# Patient Record
Sex: Male | Born: 1950 | Race: White | Hispanic: No | Marital: Single | Smoking: Former smoker
Health system: Southern US, Community
[De-identification: ages and names within clinical notes are randomized; demographics above are authoritative.]

## PROBLEM LIST (undated history)

## (undated) DIAGNOSIS — B20 Human immunodeficiency virus [HIV] disease: Secondary | ICD-10-CM

## (undated) DIAGNOSIS — B1920 Unspecified viral hepatitis C without hepatic coma: Secondary | ICD-10-CM

## (undated) HISTORY — PX: NO PAST SURGERIES: SHX2092

---

## 1993-03-04 ENCOUNTER — Encounter (INDEPENDENT_AMBULATORY_CARE_PROVIDER_SITE_OTHER): Payer: Self-pay | Admitting: *Deleted

## 1997-05-13 ENCOUNTER — Encounter: Admission: RE | Admit: 1997-05-13 | Discharge: 1997-05-13 | Payer: Self-pay | Admitting: Infectious Diseases

## 1997-06-10 ENCOUNTER — Encounter: Admission: RE | Admit: 1997-06-10 | Discharge: 1997-06-10 | Payer: Self-pay | Admitting: Infectious Diseases

## 1997-08-12 ENCOUNTER — Encounter: Admission: RE | Admit: 1997-08-12 | Discharge: 1997-08-12 | Payer: Self-pay | Admitting: Infectious Diseases

## 1997-09-18 ENCOUNTER — Encounter: Admission: RE | Admit: 1997-09-18 | Discharge: 1997-09-18 | Payer: Self-pay | Admitting: Infectious Diseases

## 1997-11-18 ENCOUNTER — Ambulatory Visit (HOSPITAL_COMMUNITY): Admission: RE | Admit: 1997-11-18 | Discharge: 1997-11-18 | Payer: Self-pay | Admitting: Infectious Diseases

## 1997-11-18 ENCOUNTER — Encounter: Admission: RE | Admit: 1997-11-18 | Discharge: 1997-11-18 | Payer: Self-pay | Admitting: Infectious Diseases

## 1997-12-18 ENCOUNTER — Encounter: Admission: RE | Admit: 1997-12-18 | Discharge: 1997-12-18 | Payer: Self-pay | Admitting: Infectious Diseases

## 1997-12-25 ENCOUNTER — Encounter: Admission: RE | Admit: 1997-12-25 | Discharge: 1997-12-25 | Payer: Self-pay | Admitting: Infectious Diseases

## 1998-03-11 ENCOUNTER — Ambulatory Visit (HOSPITAL_COMMUNITY): Admission: RE | Admit: 1998-03-11 | Discharge: 1998-03-11 | Payer: Self-pay | Admitting: Infectious Diseases

## 1998-03-26 ENCOUNTER — Encounter: Admission: RE | Admit: 1998-03-26 | Discharge: 1998-03-26 | Payer: Self-pay | Admitting: Infectious Diseases

## 1998-04-08 ENCOUNTER — Emergency Department (HOSPITAL_COMMUNITY): Admission: EM | Admit: 1998-04-08 | Discharge: 1998-04-08 | Payer: Self-pay | Admitting: Emergency Medicine

## 1998-04-09 ENCOUNTER — Encounter: Payer: Self-pay | Admitting: Infectious Diseases

## 1998-04-10 ENCOUNTER — Ambulatory Visit (HOSPITAL_COMMUNITY): Admission: RE | Admit: 1998-04-10 | Discharge: 1998-04-10 | Payer: Self-pay | Admitting: Internal Medicine

## 1998-04-10 ENCOUNTER — Encounter: Admission: RE | Admit: 1998-04-10 | Discharge: 1998-04-10 | Payer: Self-pay | Admitting: Internal Medicine

## 1998-04-10 ENCOUNTER — Encounter: Payer: Self-pay | Admitting: Internal Medicine

## 1998-05-26 ENCOUNTER — Encounter: Admission: RE | Admit: 1998-05-26 | Discharge: 1998-05-26 | Payer: Self-pay | Admitting: Infectious Diseases

## 1998-07-29 ENCOUNTER — Ambulatory Visit (HOSPITAL_COMMUNITY): Admission: RE | Admit: 1998-07-29 | Discharge: 1998-07-29 | Payer: Self-pay | Admitting: Infectious Diseases

## 1998-08-13 ENCOUNTER — Encounter: Admission: RE | Admit: 1998-08-13 | Discharge: 1998-08-13 | Payer: Self-pay | Admitting: Infectious Diseases

## 1998-11-13 ENCOUNTER — Ambulatory Visit (HOSPITAL_COMMUNITY): Admission: RE | Admit: 1998-11-13 | Discharge: 1998-11-13 | Payer: Self-pay | Admitting: Infectious Diseases

## 1998-11-13 ENCOUNTER — Encounter: Admission: RE | Admit: 1998-11-13 | Discharge: 1998-11-13 | Payer: Self-pay | Admitting: Hematology and Oncology

## 1998-12-15 ENCOUNTER — Encounter: Admission: RE | Admit: 1998-12-15 | Discharge: 1998-12-15 | Payer: Self-pay | Admitting: Infectious Diseases

## 1999-03-05 ENCOUNTER — Encounter: Admission: RE | Admit: 1999-03-05 | Discharge: 1999-03-05 | Payer: Self-pay | Admitting: Internal Medicine

## 1999-03-05 ENCOUNTER — Ambulatory Visit (HOSPITAL_COMMUNITY): Admission: RE | Admit: 1999-03-05 | Discharge: 1999-03-05 | Payer: Self-pay | Admitting: Infectious Diseases

## 1999-03-18 ENCOUNTER — Encounter: Admission: RE | Admit: 1999-03-18 | Discharge: 1999-03-18 | Payer: Self-pay | Admitting: Infectious Diseases

## 1999-08-10 ENCOUNTER — Encounter: Admission: RE | Admit: 1999-08-10 | Discharge: 1999-08-10 | Payer: Self-pay | Admitting: Infectious Diseases

## 1999-08-10 ENCOUNTER — Ambulatory Visit (HOSPITAL_COMMUNITY): Admission: RE | Admit: 1999-08-10 | Discharge: 1999-08-10 | Payer: Self-pay | Admitting: Infectious Diseases

## 1999-08-24 ENCOUNTER — Encounter: Admission: RE | Admit: 1999-08-24 | Discharge: 1999-08-24 | Payer: Self-pay | Admitting: Infectious Diseases

## 1999-12-14 ENCOUNTER — Ambulatory Visit (HOSPITAL_COMMUNITY): Admission: RE | Admit: 1999-12-14 | Discharge: 1999-12-14 | Payer: Self-pay | Admitting: Infectious Diseases

## 1999-12-14 ENCOUNTER — Encounter: Admission: RE | Admit: 1999-12-14 | Discharge: 1999-12-14 | Payer: Self-pay | Admitting: Infectious Diseases

## 1999-12-28 ENCOUNTER — Encounter: Admission: RE | Admit: 1999-12-28 | Discharge: 1999-12-28 | Payer: Self-pay | Admitting: Infectious Diseases

## 2000-02-29 ENCOUNTER — Encounter: Admission: RE | Admit: 2000-02-29 | Discharge: 2000-02-29 | Payer: Self-pay | Admitting: Infectious Diseases

## 2000-02-29 ENCOUNTER — Ambulatory Visit (HOSPITAL_COMMUNITY): Admission: RE | Admit: 2000-02-29 | Discharge: 2000-02-29 | Payer: Self-pay | Admitting: Infectious Diseases

## 2000-03-21 ENCOUNTER — Encounter: Admission: RE | Admit: 2000-03-21 | Discharge: 2000-03-21 | Payer: Self-pay | Admitting: Infectious Diseases

## 2000-06-29 ENCOUNTER — Encounter: Admission: RE | Admit: 2000-06-29 | Discharge: 2000-06-29 | Payer: Self-pay | Admitting: Infectious Diseases

## 2000-06-29 ENCOUNTER — Ambulatory Visit (HOSPITAL_COMMUNITY): Admission: RE | Admit: 2000-06-29 | Discharge: 2000-06-29 | Payer: Self-pay | Admitting: Infectious Diseases

## 2000-07-11 ENCOUNTER — Encounter: Admission: RE | Admit: 2000-07-11 | Discharge: 2000-07-11 | Payer: Self-pay | Admitting: Infectious Diseases

## 2000-10-25 ENCOUNTER — Ambulatory Visit (HOSPITAL_COMMUNITY): Admission: RE | Admit: 2000-10-25 | Discharge: 2000-10-25 | Payer: Self-pay | Admitting: Infectious Diseases

## 2000-10-25 ENCOUNTER — Encounter: Admission: RE | Admit: 2000-10-25 | Discharge: 2000-10-25 | Payer: Self-pay | Admitting: Infectious Diseases

## 2000-11-16 ENCOUNTER — Encounter: Admission: RE | Admit: 2000-11-16 | Discharge: 2000-11-16 | Payer: Self-pay | Admitting: Infectious Diseases

## 2000-12-12 ENCOUNTER — Encounter: Admission: RE | Admit: 2000-12-12 | Discharge: 2000-12-12 | Payer: Self-pay | Admitting: Infectious Diseases

## 2001-02-20 ENCOUNTER — Encounter: Admission: RE | Admit: 2001-02-20 | Discharge: 2001-02-20 | Payer: Self-pay | Admitting: Infectious Diseases

## 2001-02-20 ENCOUNTER — Ambulatory Visit (HOSPITAL_COMMUNITY): Admission: RE | Admit: 2001-02-20 | Discharge: 2001-02-20 | Payer: Self-pay | Admitting: Infectious Diseases

## 2001-04-10 ENCOUNTER — Encounter: Admission: RE | Admit: 2001-04-10 | Discharge: 2001-04-10 | Payer: Self-pay | Admitting: Infectious Diseases

## 2001-06-12 ENCOUNTER — Encounter: Admission: RE | Admit: 2001-06-12 | Discharge: 2001-06-12 | Payer: Self-pay | Admitting: Internal Medicine

## 2001-06-12 ENCOUNTER — Ambulatory Visit (HOSPITAL_COMMUNITY): Admission: RE | Admit: 2001-06-12 | Discharge: 2001-06-12 | Payer: Self-pay | Admitting: Internal Medicine

## 2001-07-03 ENCOUNTER — Encounter: Admission: RE | Admit: 2001-07-03 | Discharge: 2001-07-03 | Payer: Self-pay | Admitting: Infectious Diseases

## 2001-10-03 ENCOUNTER — Encounter: Admission: RE | Admit: 2001-10-03 | Discharge: 2001-10-03 | Payer: Self-pay | Admitting: Infectious Diseases

## 2001-10-03 ENCOUNTER — Ambulatory Visit (HOSPITAL_COMMUNITY): Admission: RE | Admit: 2001-10-03 | Discharge: 2001-10-03 | Payer: Self-pay | Admitting: Infectious Diseases

## 2001-10-23 ENCOUNTER — Encounter: Admission: RE | Admit: 2001-10-23 | Discharge: 2001-10-23 | Payer: Self-pay | Admitting: Infectious Diseases

## 2001-11-22 ENCOUNTER — Encounter (INDEPENDENT_AMBULATORY_CARE_PROVIDER_SITE_OTHER): Payer: Self-pay | Admitting: *Deleted

## 2001-11-22 ENCOUNTER — Ambulatory Visit (HOSPITAL_COMMUNITY): Admission: RE | Admit: 2001-11-22 | Discharge: 2001-11-22 | Payer: Self-pay | Admitting: Gastroenterology

## 2001-11-27 ENCOUNTER — Encounter: Admission: RE | Admit: 2001-11-27 | Discharge: 2001-11-27 | Payer: Self-pay | Admitting: Infectious Diseases

## 2002-02-19 ENCOUNTER — Encounter: Payer: Self-pay | Admitting: Emergency Medicine

## 2002-02-19 ENCOUNTER — Emergency Department (HOSPITAL_COMMUNITY): Admission: EM | Admit: 2002-02-19 | Discharge: 2002-02-19 | Payer: Self-pay

## 2002-02-19 ENCOUNTER — Emergency Department (HOSPITAL_COMMUNITY): Admission: EM | Admit: 2002-02-19 | Discharge: 2002-02-19 | Payer: Self-pay | Admitting: Emergency Medicine

## 2002-02-28 ENCOUNTER — Encounter: Admission: RE | Admit: 2002-02-28 | Discharge: 2002-02-28 | Payer: Self-pay | Admitting: Infectious Diseases

## 2002-02-28 ENCOUNTER — Ambulatory Visit (HOSPITAL_COMMUNITY): Admission: RE | Admit: 2002-02-28 | Discharge: 2002-02-28 | Payer: Self-pay | Admitting: Infectious Diseases

## 2002-03-14 ENCOUNTER — Encounter: Admission: RE | Admit: 2002-03-14 | Discharge: 2002-03-14 | Payer: Self-pay | Admitting: Infectious Diseases

## 2002-06-25 ENCOUNTER — Encounter (INDEPENDENT_AMBULATORY_CARE_PROVIDER_SITE_OTHER): Payer: Self-pay | Admitting: Infectious Diseases

## 2002-06-25 ENCOUNTER — Encounter: Admission: RE | Admit: 2002-06-25 | Discharge: 2002-06-25 | Payer: Self-pay | Admitting: Infectious Diseases

## 2002-07-09 ENCOUNTER — Ambulatory Visit (HOSPITAL_COMMUNITY): Admission: RE | Admit: 2002-07-09 | Discharge: 2002-07-09 | Payer: Self-pay | Admitting: Infectious Diseases

## 2002-07-09 ENCOUNTER — Encounter: Admission: RE | Admit: 2002-07-09 | Discharge: 2002-07-09 | Payer: Self-pay | Admitting: Infectious Diseases

## 2002-07-09 ENCOUNTER — Encounter: Payer: Self-pay | Admitting: Infectious Diseases

## 2002-09-10 ENCOUNTER — Ambulatory Visit (HOSPITAL_COMMUNITY): Admission: RE | Admit: 2002-09-10 | Discharge: 2002-09-10 | Payer: Self-pay | Admitting: *Deleted

## 2002-09-10 ENCOUNTER — Encounter (INDEPENDENT_AMBULATORY_CARE_PROVIDER_SITE_OTHER): Payer: Self-pay | Admitting: *Deleted

## 2002-09-24 ENCOUNTER — Ambulatory Visit (HOSPITAL_COMMUNITY): Admission: RE | Admit: 2002-09-24 | Discharge: 2002-09-24 | Payer: Self-pay | Admitting: *Deleted

## 2002-10-10 ENCOUNTER — Ambulatory Visit (HOSPITAL_COMMUNITY): Admission: RE | Admit: 2002-10-10 | Discharge: 2002-10-10 | Payer: Self-pay | Admitting: Gastroenterology

## 2002-10-16 ENCOUNTER — Encounter: Admission: RE | Admit: 2002-10-16 | Discharge: 2002-10-16 | Payer: Self-pay | Admitting: Infectious Diseases

## 2002-10-16 ENCOUNTER — Ambulatory Visit (HOSPITAL_COMMUNITY): Admission: RE | Admit: 2002-10-16 | Discharge: 2002-10-16 | Payer: Self-pay | Admitting: Infectious Diseases

## 2002-10-16 ENCOUNTER — Encounter (INDEPENDENT_AMBULATORY_CARE_PROVIDER_SITE_OTHER): Payer: Self-pay | Admitting: Infectious Diseases

## 2002-10-31 ENCOUNTER — Encounter: Admission: RE | Admit: 2002-10-31 | Discharge: 2002-10-31 | Payer: Self-pay | Admitting: Infectious Diseases

## 2003-02-07 ENCOUNTER — Encounter: Admission: RE | Admit: 2003-02-07 | Discharge: 2003-02-07 | Payer: Self-pay | Admitting: Infectious Diseases

## 2003-02-25 ENCOUNTER — Encounter: Admission: RE | Admit: 2003-02-25 | Discharge: 2003-02-25 | Payer: Self-pay | Admitting: Infectious Diseases

## 2003-06-18 ENCOUNTER — Encounter: Admission: RE | Admit: 2003-06-18 | Discharge: 2003-06-18 | Payer: Self-pay | Admitting: Infectious Diseases

## 2003-07-08 ENCOUNTER — Encounter: Admission: RE | Admit: 2003-07-08 | Discharge: 2003-07-08 | Payer: Self-pay | Admitting: Infectious Diseases

## 2003-11-04 ENCOUNTER — Ambulatory Visit: Payer: Self-pay | Admitting: Infectious Diseases

## 2003-11-04 ENCOUNTER — Ambulatory Visit (HOSPITAL_COMMUNITY): Admission: RE | Admit: 2003-11-04 | Discharge: 2003-11-04 | Payer: Self-pay | Admitting: Infectious Diseases

## 2003-11-18 ENCOUNTER — Ambulatory Visit: Payer: Self-pay | Admitting: Infectious Diseases

## 2003-11-25 ENCOUNTER — Ambulatory Visit: Payer: Self-pay | Admitting: Infectious Diseases

## 2004-04-01 ENCOUNTER — Ambulatory Visit: Payer: Self-pay | Admitting: Infectious Diseases

## 2004-04-01 ENCOUNTER — Ambulatory Visit (HOSPITAL_COMMUNITY): Admission: RE | Admit: 2004-04-01 | Discharge: 2004-04-01 | Payer: Self-pay | Admitting: Infectious Diseases

## 2004-04-09 ENCOUNTER — Ambulatory Visit: Payer: Self-pay | Admitting: Infectious Diseases

## 2004-04-14 ENCOUNTER — Ambulatory Visit: Payer: Self-pay | Admitting: Infectious Diseases

## 2004-05-28 ENCOUNTER — Ambulatory Visit: Payer: Self-pay | Admitting: Internal Medicine

## 2004-06-22 ENCOUNTER — Ambulatory Visit: Payer: Self-pay | Admitting: Infectious Diseases

## 2004-06-22 ENCOUNTER — Ambulatory Visit (HOSPITAL_COMMUNITY): Admission: RE | Admit: 2004-06-22 | Discharge: 2004-06-22 | Payer: Self-pay | Admitting: Infectious Diseases

## 2004-06-25 ENCOUNTER — Ambulatory Visit: Payer: Self-pay | Admitting: Infectious Diseases

## 2004-06-25 ENCOUNTER — Ambulatory Visit: Payer: Self-pay | Admitting: Gastroenterology

## 2004-07-06 ENCOUNTER — Ambulatory Visit: Payer: Self-pay | Admitting: Infectious Diseases

## 2004-07-14 ENCOUNTER — Ambulatory Visit: Payer: Self-pay | Admitting: Infectious Diseases

## 2004-07-23 ENCOUNTER — Ambulatory Visit: Payer: Self-pay | Admitting: Gastroenterology

## 2004-07-30 ENCOUNTER — Ambulatory Visit (HOSPITAL_COMMUNITY): Admission: RE | Admit: 2004-07-30 | Discharge: 2004-07-30 | Payer: Self-pay | Admitting: Gastroenterology

## 2004-08-10 ENCOUNTER — Ambulatory Visit: Payer: Self-pay | Admitting: Infectious Diseases

## 2004-08-20 ENCOUNTER — Ambulatory Visit: Payer: Self-pay | Admitting: Gastroenterology

## 2004-09-03 ENCOUNTER — Ambulatory Visit: Payer: Self-pay | Admitting: Internal Medicine

## 2004-09-05 ENCOUNTER — Emergency Department (HOSPITAL_COMMUNITY): Admission: EM | Admit: 2004-09-05 | Discharge: 2004-09-05 | Payer: Self-pay | Admitting: Emergency Medicine

## 2004-09-10 ENCOUNTER — Ambulatory Visit: Payer: Self-pay | Admitting: Gastroenterology

## 2004-09-17 ENCOUNTER — Ambulatory Visit: Payer: Self-pay | Admitting: Gastroenterology

## 2004-09-29 ENCOUNTER — Ambulatory Visit: Payer: Self-pay | Admitting: Gastroenterology

## 2004-09-30 ENCOUNTER — Ambulatory Visit: Payer: Self-pay | Admitting: Infectious Diseases

## 2004-09-30 ENCOUNTER — Ambulatory Visit (HOSPITAL_COMMUNITY): Admission: RE | Admit: 2004-09-30 | Discharge: 2004-09-30 | Payer: Self-pay | Admitting: Infectious Diseases

## 2004-10-12 ENCOUNTER — Ambulatory Visit: Payer: Self-pay | Admitting: Infectious Diseases

## 2004-10-12 ENCOUNTER — Emergency Department (HOSPITAL_COMMUNITY): Admission: EM | Admit: 2004-10-12 | Discharge: 2004-10-13 | Payer: Self-pay | Admitting: Emergency Medicine

## 2004-10-15 ENCOUNTER — Ambulatory Visit: Payer: Self-pay | Admitting: Gastroenterology

## 2004-10-29 ENCOUNTER — Ambulatory Visit: Payer: Self-pay | Admitting: Gastroenterology

## 2004-11-10 ENCOUNTER — Ambulatory Visit (HOSPITAL_COMMUNITY): Admission: RE | Admit: 2004-11-10 | Discharge: 2004-11-10 | Payer: Self-pay | Admitting: Gastroenterology

## 2004-11-10 ENCOUNTER — Encounter (INDEPENDENT_AMBULATORY_CARE_PROVIDER_SITE_OTHER): Payer: Self-pay | Admitting: *Deleted

## 2004-11-12 ENCOUNTER — Ambulatory Visit: Payer: Self-pay | Admitting: Gastroenterology

## 2004-11-16 ENCOUNTER — Ambulatory Visit: Payer: Self-pay | Admitting: Infectious Diseases

## 2004-11-26 ENCOUNTER — Ambulatory Visit: Payer: Self-pay | Admitting: Gastroenterology

## 2004-12-23 ENCOUNTER — Ambulatory Visit (HOSPITAL_COMMUNITY): Admission: RE | Admit: 2004-12-23 | Discharge: 2004-12-23 | Payer: Self-pay | Admitting: Infectious Diseases

## 2004-12-23 ENCOUNTER — Ambulatory Visit: Payer: Self-pay | Admitting: Infectious Diseases

## 2004-12-28 ENCOUNTER — Ambulatory Visit: Payer: Self-pay | Admitting: Infectious Diseases

## 2004-12-31 ENCOUNTER — Ambulatory Visit: Payer: Self-pay | Admitting: Gastroenterology

## 2005-01-19 ENCOUNTER — Ambulatory Visit: Payer: Self-pay | Admitting: Infectious Diseases

## 2005-04-19 ENCOUNTER — Encounter (INDEPENDENT_AMBULATORY_CARE_PROVIDER_SITE_OTHER): Payer: Self-pay | Admitting: *Deleted

## 2005-04-19 ENCOUNTER — Ambulatory Visit: Payer: Self-pay | Admitting: Infectious Diseases

## 2005-04-19 ENCOUNTER — Encounter: Admission: RE | Admit: 2005-04-19 | Discharge: 2005-04-19 | Payer: Self-pay | Admitting: Infectious Diseases

## 2005-04-19 LAB — CONVERTED CEMR LAB
CD4 Count: 740 microliters
HIV 1 RNA Quant: 49 copies/mL

## 2005-04-22 ENCOUNTER — Ambulatory Visit: Payer: Self-pay | Admitting: Gastroenterology

## 2005-05-10 ENCOUNTER — Ambulatory Visit: Payer: Self-pay | Admitting: Infectious Diseases

## 2005-05-17 ENCOUNTER — Ambulatory Visit (HOSPITAL_COMMUNITY): Admission: RE | Admit: 2005-05-17 | Discharge: 2005-05-17 | Payer: Self-pay | Admitting: Gastroenterology

## 2005-08-19 ENCOUNTER — Encounter: Admission: RE | Admit: 2005-08-19 | Discharge: 2005-08-19 | Payer: Self-pay | Admitting: Infectious Diseases

## 2005-08-19 ENCOUNTER — Ambulatory Visit: Payer: Self-pay | Admitting: Infectious Diseases

## 2005-08-19 ENCOUNTER — Encounter (INDEPENDENT_AMBULATORY_CARE_PROVIDER_SITE_OTHER): Payer: Self-pay | Admitting: *Deleted

## 2005-08-19 LAB — CONVERTED CEMR LAB: CD4 Count: 830 microliters

## 2005-09-06 ENCOUNTER — Ambulatory Visit: Payer: Self-pay | Admitting: Infectious Diseases

## 2005-10-21 ENCOUNTER — Ambulatory Visit: Payer: Self-pay | Admitting: Gastroenterology

## 2005-11-03 DIAGNOSIS — B1710 Acute hepatitis C without hepatic coma: Secondary | ICD-10-CM

## 2005-11-03 DIAGNOSIS — B20 Human immunodeficiency virus [HIV] disease: Secondary | ICD-10-CM | POA: Insufficient documentation

## 2005-12-27 ENCOUNTER — Encounter (INDEPENDENT_AMBULATORY_CARE_PROVIDER_SITE_OTHER): Payer: Self-pay | Admitting: *Deleted

## 2005-12-27 ENCOUNTER — Encounter: Admission: RE | Admit: 2005-12-27 | Discharge: 2005-12-27 | Payer: Self-pay | Admitting: Infectious Diseases

## 2005-12-27 ENCOUNTER — Ambulatory Visit: Payer: Self-pay | Admitting: Infectious Diseases

## 2005-12-27 LAB — CONVERTED CEMR LAB
ALT: 33 units/L (ref 0–53)
AST: 85 units/L — ABNORMAL HIGH (ref 0–37)
Albumin: 4.1 g/dL (ref 3.5–5.2)
Alkaline Phosphatase: 94 units/L (ref 39–117)
Basophils Absolute: 0 10*3/uL (ref 0.0–0.1)
CD4 Count: 950 microliters
Calcium: 9.2 mg/dL (ref 8.4–10.5)
Creatinine, Ser: 0.81 mg/dL (ref 0.40–1.50)
Eosinophils Relative: 3 % (ref 0–4)
HCT: 45.1 % (ref 41.0–49.0)
HIV 1 RNA Quant: <50 copies/mL (ref <50)
HIV-1 RNA Quant, Log: <1.7 (ref <1.70)
Lymphs Abs: 2.4 10*3/uL (ref 0.8–3.1)
MCHC: 35.3 g/dL (ref 33.1–35.4)
MCV: 97.2 fL (ref 78.8–100.0)
Monocytes Absolute: 0.6 10*3/uL (ref 0.2–0.7)
Neutro Abs: 3.7 10*3/uL (ref 1.8–6.8)
Neutrophils Relative %: 53 % (ref 47–77)
Potassium: 4 meq/L (ref 3.5–5.3)
RDW: 13.3 % (ref 11.5–15.3)

## 2006-01-17 ENCOUNTER — Ambulatory Visit: Payer: Self-pay | Admitting: Infectious Diseases

## 2006-03-28 ENCOUNTER — Encounter (INDEPENDENT_AMBULATORY_CARE_PROVIDER_SITE_OTHER): Payer: Self-pay | Admitting: *Deleted

## 2006-03-28 LAB — CONVERTED CEMR LAB: HCV Quantitative: 863000 intl units/mL

## 2006-04-10 ENCOUNTER — Encounter (INDEPENDENT_AMBULATORY_CARE_PROVIDER_SITE_OTHER): Payer: Self-pay | Admitting: *Deleted

## 2006-07-11 ENCOUNTER — Ambulatory Visit: Payer: Self-pay | Admitting: Infectious Diseases

## 2006-07-11 ENCOUNTER — Encounter: Admission: RE | Admit: 2006-07-11 | Discharge: 2006-07-11 | Payer: Self-pay | Admitting: Infectious Diseases

## 2006-07-18 ENCOUNTER — Ambulatory Visit: Payer: Self-pay | Admitting: Infectious Diseases

## 2006-07-27 ENCOUNTER — Encounter (INDEPENDENT_AMBULATORY_CARE_PROVIDER_SITE_OTHER): Payer: Self-pay | Admitting: Infectious Diseases

## 2006-07-29 ENCOUNTER — Encounter (INDEPENDENT_AMBULATORY_CARE_PROVIDER_SITE_OTHER): Payer: Self-pay | Admitting: *Deleted

## 2006-09-13 ENCOUNTER — Telehealth: Payer: Self-pay | Admitting: Internal Medicine

## 2006-09-16 ENCOUNTER — Telehealth: Payer: Self-pay | Admitting: Internal Medicine

## 2006-09-20 ENCOUNTER — Ambulatory Visit: Payer: Self-pay | Admitting: Gastroenterology

## 2006-10-14 ENCOUNTER — Ambulatory Visit (HOSPITAL_COMMUNITY): Admission: RE | Admit: 2006-10-14 | Discharge: 2006-10-14 | Payer: Self-pay | Admitting: Gastroenterology

## 2006-12-06 ENCOUNTER — Encounter: Admission: RE | Admit: 2006-12-06 | Discharge: 2006-12-06 | Payer: Self-pay | Admitting: Internal Medicine

## 2006-12-06 ENCOUNTER — Encounter: Payer: Self-pay | Admitting: Infectious Diseases

## 2006-12-06 ENCOUNTER — Ambulatory Visit: Payer: Self-pay | Admitting: *Deleted

## 2006-12-06 LAB — CONVERTED CEMR LAB
Alkaline Phosphatase: 100 units/L (ref 39–117)
Basophils Relative: 1 % (ref 0–1)
Creatinine, Ser: 0.7 mg/dL (ref 0.40–1.50)
Eosinophils Absolute: 0.1 10*3/uL (ref 0.0–0.7)
Eosinophils Relative: 2 % (ref 0–5)
Hemoglobin: 15.5 g/dL (ref 13.0–17.0)
Monocytes Relative: 11 % (ref 3–11)
Neutro Abs: 4.9 10*3/uL (ref 1.7–7.7)
Neutrophils Relative %: 59 % (ref 43–77)
Potassium: 3.9 meq/L (ref 3.5–5.3)
RBC: 4.53 M/uL (ref 4.22–5.81)
RDW: 13.4 % (ref 11.5–14.0)
Sodium: 142 meq/L (ref 135–145)
Total Bilirubin: 0.8 mg/dL (ref 0.3–1.2)
WBC: 8.2 10*3/uL (ref 4.0–10.5)

## 2007-01-17 ENCOUNTER — Ambulatory Visit: Payer: Self-pay | Admitting: Infectious Diseases

## 2007-01-31 ENCOUNTER — Encounter (INDEPENDENT_AMBULATORY_CARE_PROVIDER_SITE_OTHER): Payer: Self-pay | Admitting: *Deleted

## 2007-05-23 ENCOUNTER — Encounter: Admission: RE | Admit: 2007-05-23 | Discharge: 2007-05-23 | Payer: Self-pay | Admitting: Infectious Diseases

## 2007-05-23 ENCOUNTER — Ambulatory Visit: Payer: Self-pay | Admitting: Infectious Diseases

## 2007-05-23 LAB — CONVERTED CEMR LAB
ALT: 20 units/L (ref 0–53)
AST: 50 units/L — ABNORMAL HIGH (ref 0–37)
Alkaline Phosphatase: 98 units/L (ref 39–117)
BUN: 7 mg/dL (ref 6–23)
Basophils Absolute: 0 10*3/uL (ref 0.0–0.1)
CO2: 19 meq/L (ref 19–32)
Chloride: 107 meq/L (ref 96–112)
Cholesterol: 166 mg/dL (ref 0–200)
Eosinophils Absolute: 0.2 10*3/uL (ref 0.0–0.7)
Glucose, Bld: 156 mg/dL — ABNORMAL HIGH (ref 70–99)
HCT: 42.1 % (ref 39.0–52.0)
HDL: 38 mg/dL — ABNORMAL LOW (ref >39)
HIV-1 RNA Quant, Log: 2.47 — ABNORMAL HIGH (ref <1.70)
Hemoglobin: 15.2 g/dL (ref 13.0–17.0)
MCV: 100.5 fL — ABNORMAL HIGH (ref 78.0–100.0)
Monocytes Relative: 8 % (ref 3–12)
Neutro Abs: 4.9 10*3/uL (ref 1.7–7.7)
RBC: 4.19 M/uL — ABNORMAL LOW (ref 4.22–5.81)
Total Bilirubin: 0.7 mg/dL (ref 0.3–1.2)
Total CHOL/HDL Ratio: 4.4
Triglycerides: 132 mg/dL (ref <150)
WBC: 8.3 10*3/uL (ref 4.0–10.5)

## 2007-06-06 ENCOUNTER — Ambulatory Visit: Payer: Self-pay | Admitting: Infectious Diseases

## 2007-06-06 DIAGNOSIS — L299 Pruritus, unspecified: Secondary | ICD-10-CM | POA: Insufficient documentation

## 2007-06-08 ENCOUNTER — Encounter: Payer: Self-pay | Admitting: Infectious Disease

## 2007-10-05 ENCOUNTER — Ambulatory Visit: Payer: Self-pay | Admitting: Infectious Diseases

## 2007-10-05 LAB — CONVERTED CEMR LAB
ALT: 28 units/L (ref 0–53)
AST: 86 units/L — ABNORMAL HIGH (ref 0–37)
Alkaline Phosphatase: 84 units/L (ref 39–117)
BUN: 8 mg/dL (ref 6–23)
Basophils Absolute: 0.1 10*3/uL (ref 0.0–0.1)
CO2: 23 meq/L (ref 19–32)
Glucose, Bld: 107 mg/dL — ABNORMAL HIGH (ref 70–99)
HCT: 43.1 % (ref 39.0–52.0)
HDL: 59 mg/dL (ref >39)
HIV 1 RNA Quant: 524 copies/mL — ABNORMAL HIGH (ref <50)
LDL Cholesterol: 105 mg/dL — ABNORMAL HIGH (ref 0–99)
Lymphocytes Relative: 29 % (ref 12–46)
Monocytes Absolute: 0.8 10*3/uL (ref 0.1–1.0)
Neutrophils Relative %: 60 % (ref 43–77)
Platelets: 173 10*3/uL (ref 150–400)
Sodium: 139 meq/L (ref 135–145)
Total Bilirubin: 1 mg/dL (ref 0.3–1.2)
Total Protein: 7.4 g/dL (ref 6.0–8.3)
WBC: 8.2 10*3/uL (ref 4.0–10.5)

## 2007-10-25 ENCOUNTER — Ambulatory Visit: Payer: Self-pay | Admitting: Infectious Diseases

## 2007-10-25 DIAGNOSIS — R634 Abnormal weight loss: Secondary | ICD-10-CM | POA: Insufficient documentation

## 2007-11-05 ENCOUNTER — Emergency Department (HOSPITAL_COMMUNITY): Admission: EM | Admit: 2007-11-05 | Discharge: 2007-11-05 | Payer: Self-pay | Admitting: Family Medicine

## 2007-11-28 ENCOUNTER — Telehealth: Payer: Self-pay | Admitting: Infectious Diseases

## 2008-02-26 ENCOUNTER — Ambulatory Visit: Payer: Self-pay | Admitting: Infectious Diseases

## 2008-02-26 LAB — CONVERTED CEMR LAB
ALT: 20 units/L (ref 0–53)
Albumin: 4.3 g/dL (ref 3.5–5.2)
BUN: 11 mg/dL (ref 6–23)
Basophils Relative: 1 % (ref 0–1)
Chloride: 109 meq/L (ref 96–112)
Eosinophils Absolute: 0.2 10*3/uL (ref 0.0–0.7)
Lymphs Abs: 2.3 10*3/uL (ref 0.7–4.0)
MCV: 98.5 fL (ref 78.0–100.0)
Monocytes Relative: 9 % (ref 3–12)
Neutrophils Relative %: 56 % (ref 43–77)
Platelets: 181 10*3/uL (ref 150–400)
RBC: 4.61 M/uL (ref 4.22–5.81)
RDW: 13.6 % (ref 11.5–15.5)
Total Bilirubin: 0.8 mg/dL (ref 0.3–1.2)
Total Protein: 7.4 g/dL (ref 6.0–8.3)
WBC: 7 10*3/uL (ref 4.0–10.5)

## 2008-03-12 ENCOUNTER — Ambulatory Visit: Payer: Self-pay | Admitting: Infectious Diseases

## 2008-03-12 ENCOUNTER — Ambulatory Visit (HOSPITAL_COMMUNITY): Admission: RE | Admit: 2008-03-12 | Discharge: 2008-03-12 | Payer: Self-pay | Admitting: Infectious Diseases

## 2008-03-12 DIAGNOSIS — R61 Generalized hyperhidrosis: Secondary | ICD-10-CM

## 2008-03-15 ENCOUNTER — Ambulatory Visit (HOSPITAL_COMMUNITY): Admission: RE | Admit: 2008-03-15 | Discharge: 2008-03-15 | Payer: Self-pay | Admitting: Infectious Diseases

## 2008-04-25 ENCOUNTER — Encounter (INDEPENDENT_AMBULATORY_CARE_PROVIDER_SITE_OTHER): Payer: Self-pay | Admitting: *Deleted

## 2008-06-10 ENCOUNTER — Ambulatory Visit: Payer: Self-pay | Admitting: Infectious Diseases

## 2008-06-10 LAB — CONVERTED CEMR LAB
ALT: 22 units/L (ref 0–53)
AST: 59 units/L — ABNORMAL HIGH (ref 0–37)
Albumin: 4.2 g/dL (ref 3.5–5.2)
Alkaline Phosphatase: 105 units/L (ref 39–117)
Basophils Relative: 1 % (ref 0–1)
Calcium: 9.2 mg/dL (ref 8.4–10.5)
Eosinophils Absolute: 0.1 10*3/uL (ref 0.0–0.7)
Eosinophils Relative: 1 % (ref 0–5)
HCT: 43.1 % (ref 39.0–52.0)
HIV-1 RNA Quant, Log: 2.98 — ABNORMAL HIGH (ref <1.68)
Monocytes Absolute: 0.7 10*3/uL (ref 0.1–1.0)
Platelets: 190 10*3/uL (ref 150–400)
RDW: 13.5 % (ref 11.5–15.5)
Sodium: 142 meq/L (ref 135–145)
Total Bilirubin: 0.8 mg/dL (ref 0.3–1.2)

## 2008-06-25 ENCOUNTER — Ambulatory Visit: Payer: Self-pay | Admitting: Infectious Diseases

## 2008-06-25 DIAGNOSIS — N62 Hypertrophy of breast: Secondary | ICD-10-CM

## 2008-06-28 ENCOUNTER — Telehealth: Payer: Self-pay | Admitting: Infectious Diseases

## 2008-07-17 ENCOUNTER — Encounter: Payer: Self-pay | Admitting: Infectious Diseases

## 2008-10-22 ENCOUNTER — Ambulatory Visit: Payer: Self-pay | Admitting: Internal Medicine

## 2008-10-22 DIAGNOSIS — J209 Acute bronchitis, unspecified: Secondary | ICD-10-CM

## 2008-10-24 ENCOUNTER — Telehealth: Payer: Self-pay

## 2008-11-27 ENCOUNTER — Telehealth: Payer: Self-pay | Admitting: Infectious Diseases

## 2008-12-03 ENCOUNTER — Encounter: Payer: Self-pay | Admitting: Infectious Diseases

## 2008-12-06 ENCOUNTER — Emergency Department (HOSPITAL_COMMUNITY): Admission: EM | Admit: 2008-12-06 | Discharge: 2008-12-07 | Payer: Self-pay | Admitting: Emergency Medicine

## 2008-12-16 ENCOUNTER — Emergency Department (HOSPITAL_COMMUNITY): Admission: EM | Admit: 2008-12-16 | Discharge: 2008-12-16 | Payer: Self-pay | Admitting: Emergency Medicine

## 2009-01-09 ENCOUNTER — Ambulatory Visit: Payer: Self-pay | Admitting: Infectious Diseases

## 2009-01-09 ENCOUNTER — Emergency Department (HOSPITAL_COMMUNITY): Admission: EM | Admit: 2009-01-09 | Discharge: 2009-01-09 | Payer: Self-pay | Admitting: Family Medicine

## 2009-01-09 LAB — CONVERTED CEMR LAB
CO2: 22 meq/L (ref 19–32)
Cholesterol: 147 mg/dL (ref 0–200)
Eosinophils Absolute: 0.2 10*3/uL (ref 0.0–0.7)
Eosinophils Relative: 1 % (ref 0–5)
HCT: 44.1 % (ref 39.0–52.0)
HIV-1 RNA Quant, Log: 2.3 — ABNORMAL HIGH (ref <1.68)
Hemoglobin: 15.3 g/dL (ref 13.0–17.0)
Lymphs Abs: 2.8 10*3/uL (ref 0.7–4.0)
MCHC: 34.7 g/dL (ref 30.0–36.0)
Monocytes Absolute: 1.3 10*3/uL — ABNORMAL HIGH (ref 0.1–1.0)
Monocytes Relative: 11 % (ref 3–12)
Neutrophils Relative %: 65 % (ref 43–77)
Platelets: 253 10*3/uL (ref 150–400)
Potassium: 3.7 meq/L (ref 3.5–5.3)
RDW: 12.6 % (ref 11.5–15.5)
Sodium: 140 meq/L (ref 135–145)
Triglycerides: 174 mg/dL — ABNORMAL HIGH (ref <150)
VLDL: 35 mg/dL (ref 0–40)

## 2009-01-14 ENCOUNTER — Ambulatory Visit: Payer: Self-pay | Admitting: Infectious Diseases

## 2009-01-14 LAB — CONVERTED CEMR LAB: AFP-Tumor Marker: 12.4 ng/mL — ABNORMAL HIGH (ref 0.0–8.0)

## 2009-03-05 ENCOUNTER — Encounter (INDEPENDENT_AMBULATORY_CARE_PROVIDER_SITE_OTHER): Payer: Self-pay | Admitting: *Deleted

## 2009-06-17 ENCOUNTER — Ambulatory Visit: Payer: Self-pay | Admitting: Infectious Diseases

## 2009-06-17 LAB — CONVERTED CEMR LAB
ALT: 22 units/L (ref 0–53)
Albumin: 4.2 g/dL (ref 3.5–5.2)
Alkaline Phosphatase: 89 units/L (ref 39–117)
BUN: 11 mg/dL (ref 6–23)
Basophils Absolute: 0.1 10*3/uL (ref 0.0–0.1)
CO2: 23 meq/L (ref 19–32)
Creatinine, Ser: 1.01 mg/dL (ref 0.40–1.50)
Eosinophils Relative: 1 % (ref 0–5)
Glucose, Bld: 129 mg/dL — ABNORMAL HIGH (ref 70–99)
HIV 1 RNA Quant: <48 copies/mL (ref <48)
HIV-1 RNA Quant, Log: <1.68 (ref <1.68)
Hemoglobin: 15.5 g/dL (ref 13.0–17.0)
Lymphs Abs: 2.9 10*3/uL (ref 0.7–4.0)
Monocytes Relative: 6 % (ref 3–12)
Neutrophils Relative %: 63 % (ref 43–77)
RBC: 4.59 M/uL (ref 4.22–5.81)
Sodium: 136 meq/L (ref 135–145)
Total Bilirubin: 0.7 mg/dL (ref 0.3–1.2)

## 2009-07-01 ENCOUNTER — Encounter (INDEPENDENT_AMBULATORY_CARE_PROVIDER_SITE_OTHER): Payer: Self-pay | Admitting: *Deleted

## 2009-07-01 ENCOUNTER — Ambulatory Visit: Payer: Self-pay | Admitting: Infectious Diseases

## 2009-07-01 DIAGNOSIS — F329 Major depressive disorder, single episode, unspecified: Secondary | ICD-10-CM | POA: Insufficient documentation

## 2009-07-01 DIAGNOSIS — F3289 Other specified depressive episodes: Secondary | ICD-10-CM | POA: Insufficient documentation

## 2009-07-04 ENCOUNTER — Ambulatory Visit (HOSPITAL_COMMUNITY): Admission: RE | Admit: 2009-07-04 | Discharge: 2009-07-04 | Payer: Self-pay | Admitting: Infectious Diseases

## 2009-09-16 IMAGING — CR DG CHEST 2V
2 series · 2 of 2 positions shown · non-contrast
Comparison: None

CLINICAL DATA: HIV.  Night sweats.  Weight loss.

CHEST - 2 VIEW

[w chest pa]
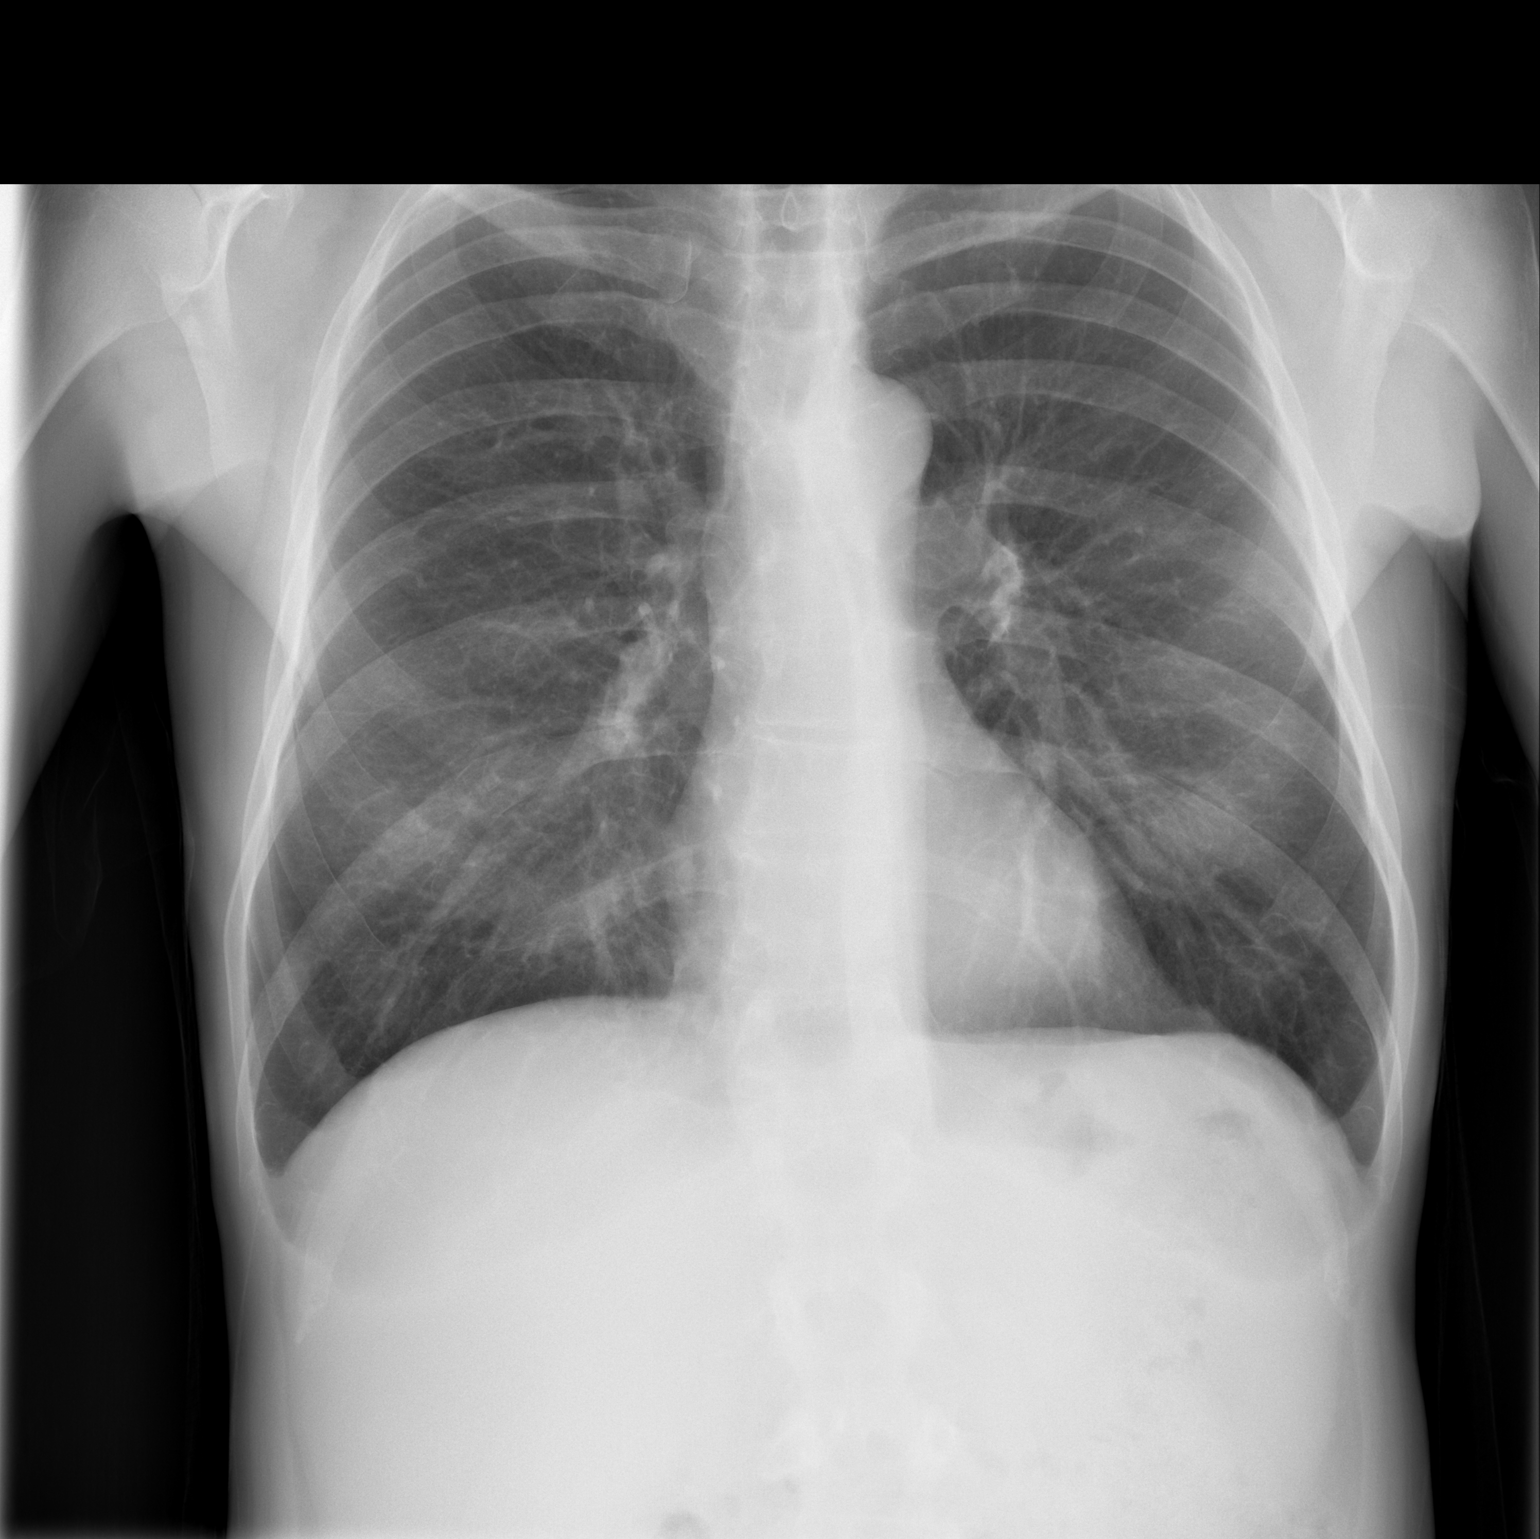

[w chest lat]
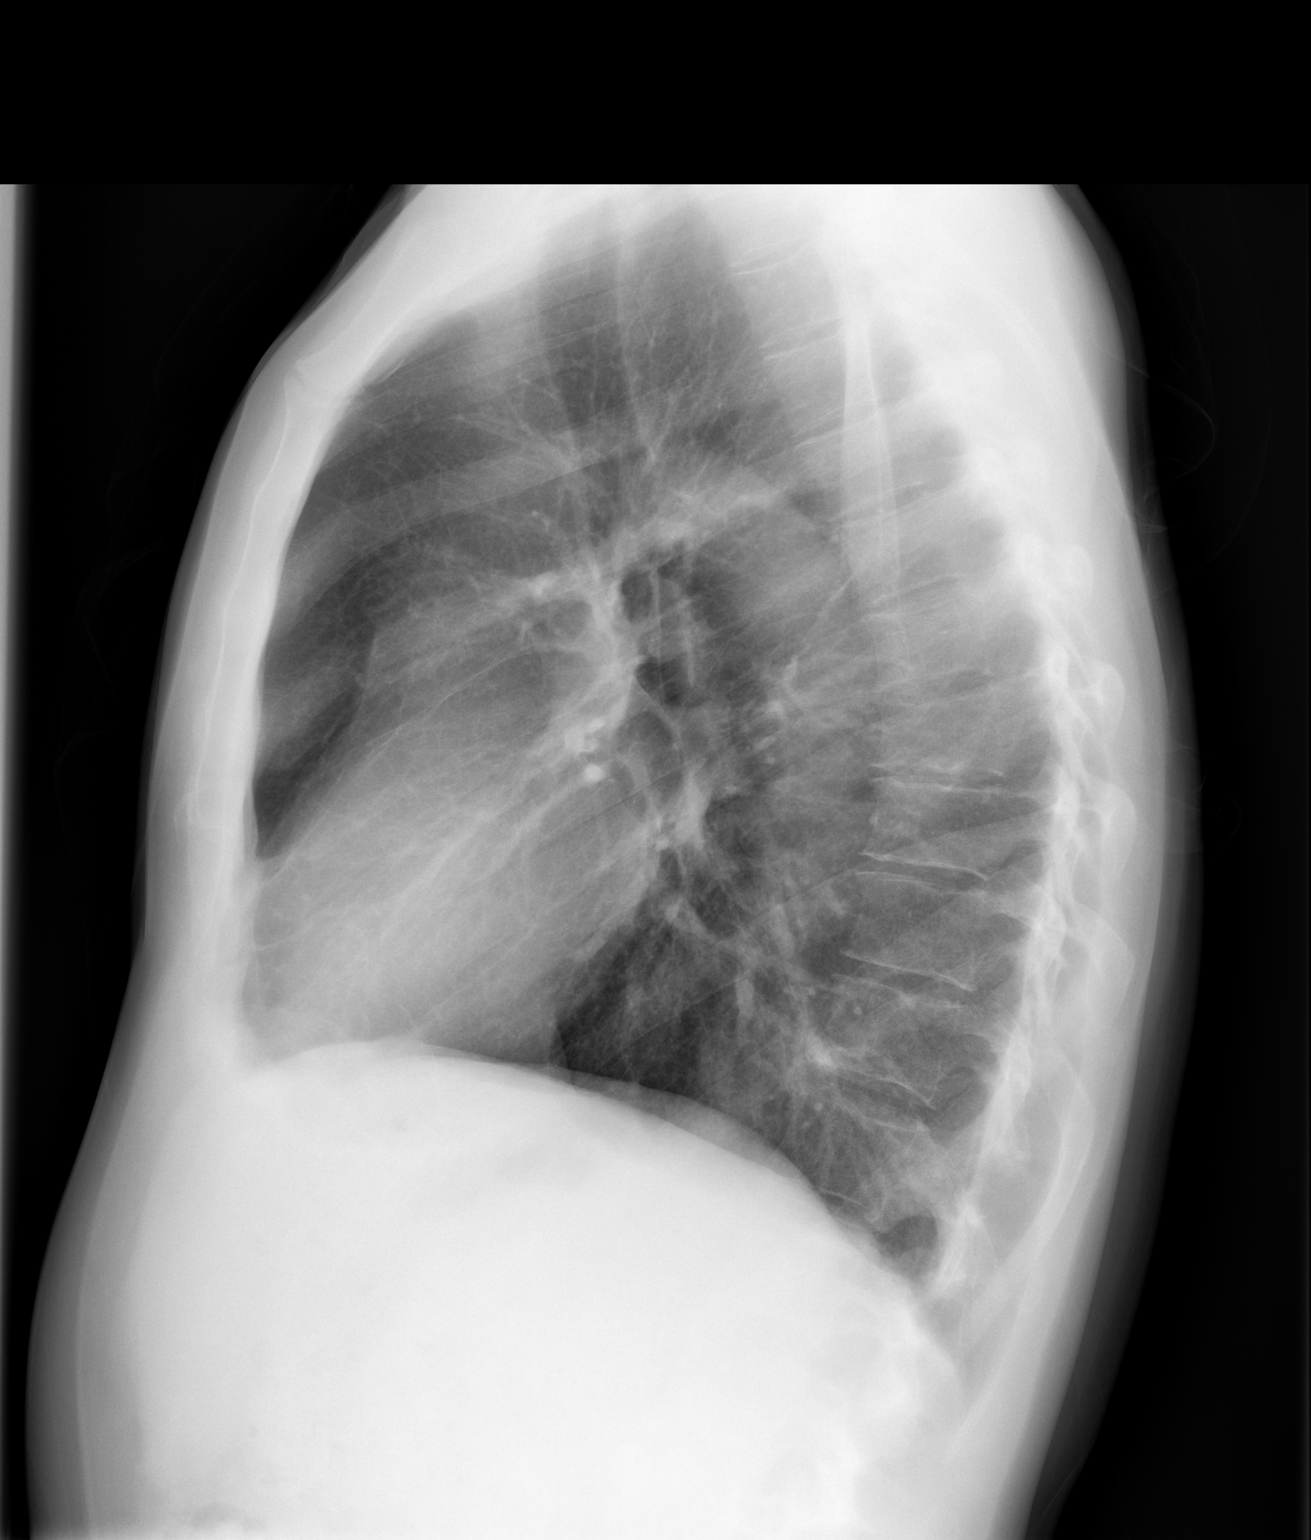

[2 of 2 positions shown; findings below may reference images not displayed]

FINDINGS: The heart size is normal.

There is no pleural effusion or pulmonary interstitial edema.

No airspace consolidation identified.

There is no significant adenopathy.
IMPRESSION: 1.  No active disease.

## 2009-09-19 IMAGING — US US ABDOMEN COMPLETE
1 series · 14 of 25 positions shown · non-contrast
Comparison: 10/14/2006 and 05/17/2005 ultrasounds.

CLINICAL DATA: Hepatitis C.  Evaluate for focal hepatic
lesions/hepatoma.

ABDOMEN ULTRASOUND
TECHNIQUE: Complete abdominal ultrasound examination was performed
including evaluation of the liver, gallbladder, bile ducts,
pancreas, kidneys, spleen, IVC, and abdominal aorta.

[Series 1: us abdomen complete · 0.28mm/px · 14 of 79 slices shown]
[im 1/79]
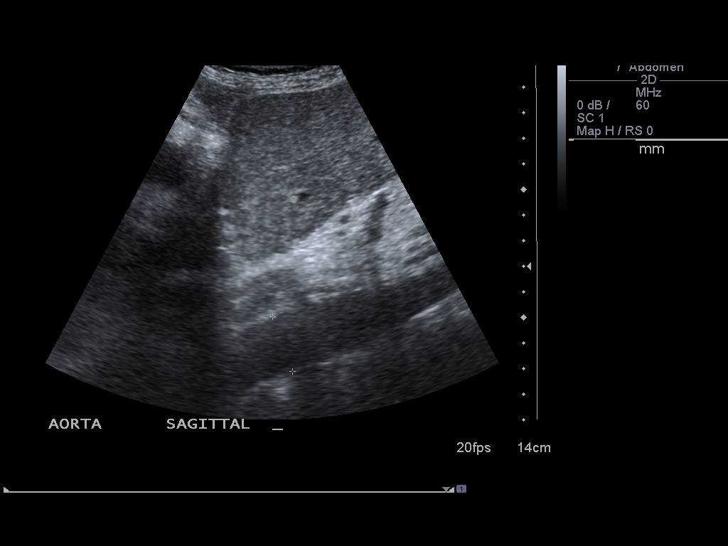
[im 7/79]
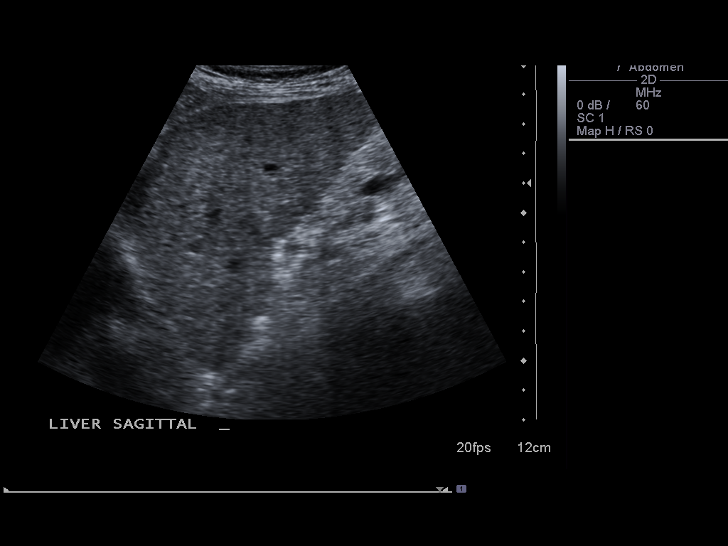
[im 14/79]
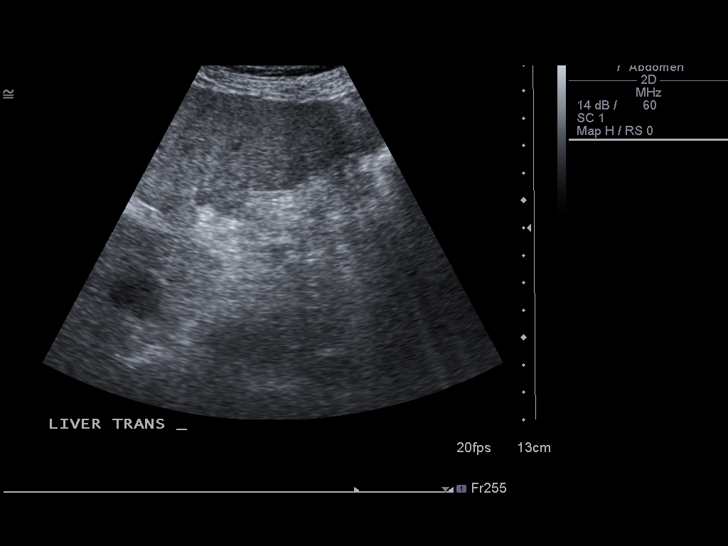
[im 20/79]
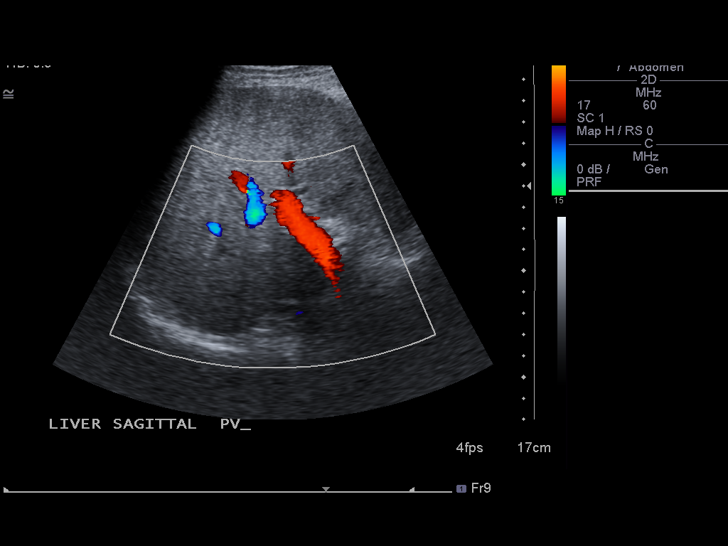
[im 27/79]
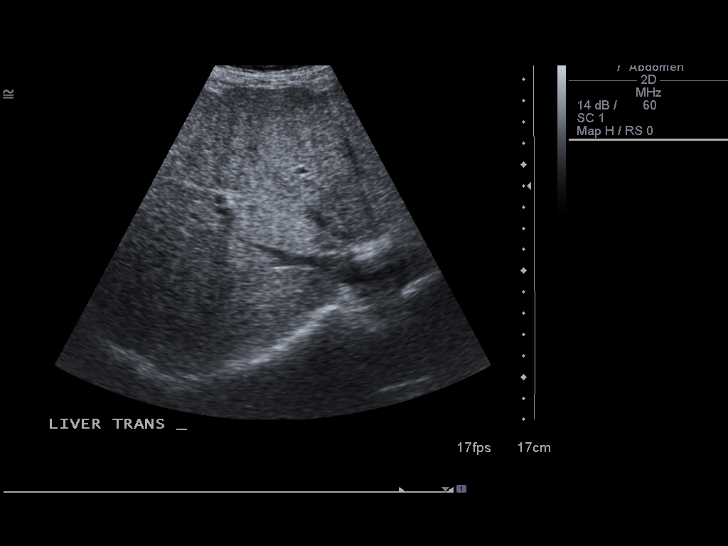
[im 30/79]
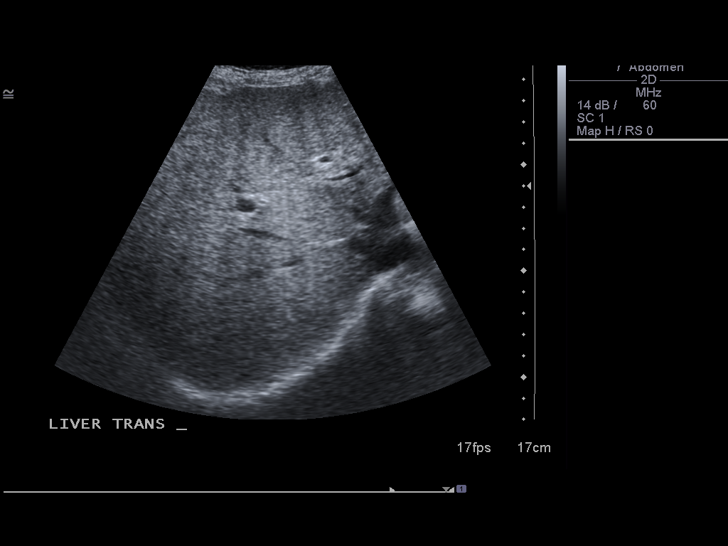
[im 36/79]
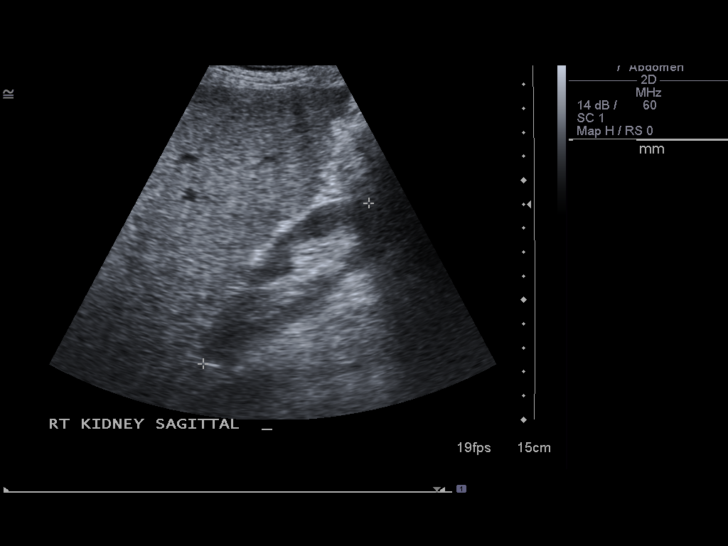
[im 43/79]
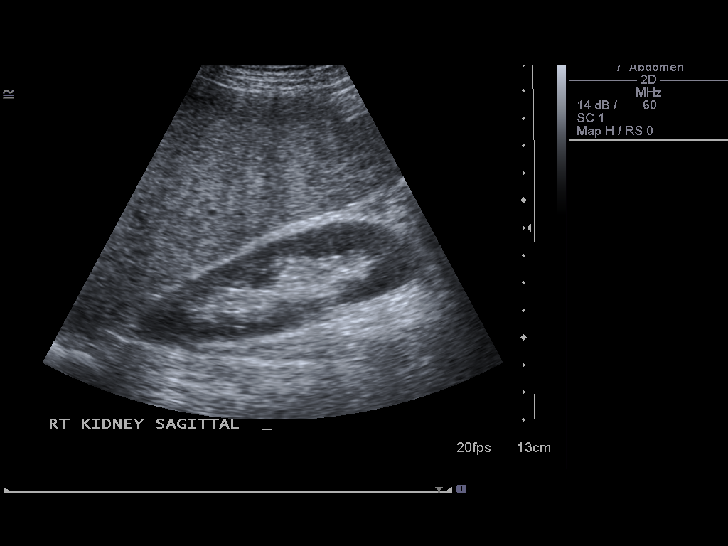
[im 49/79]
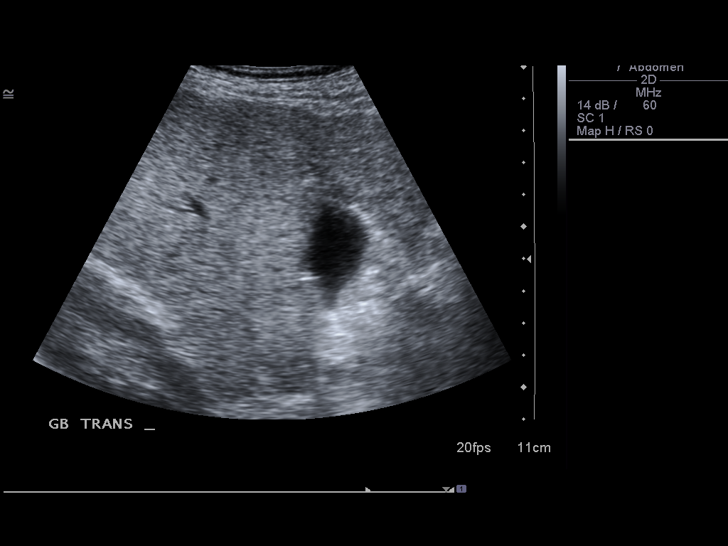
[im 53/79]
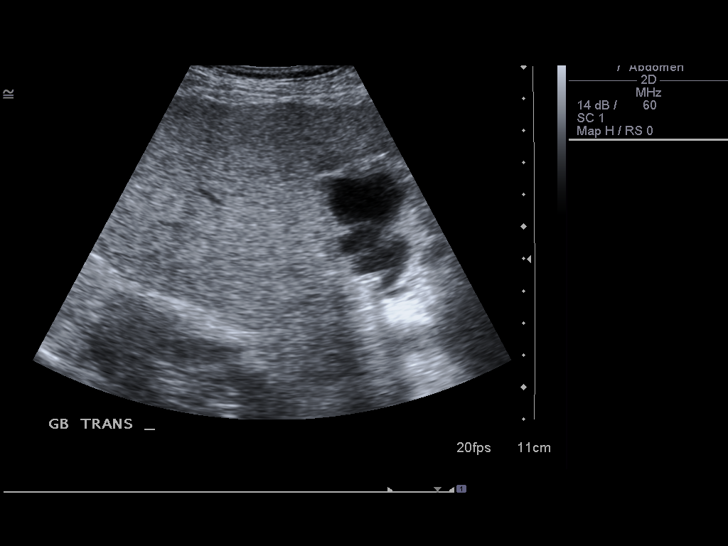
[im 59/79]
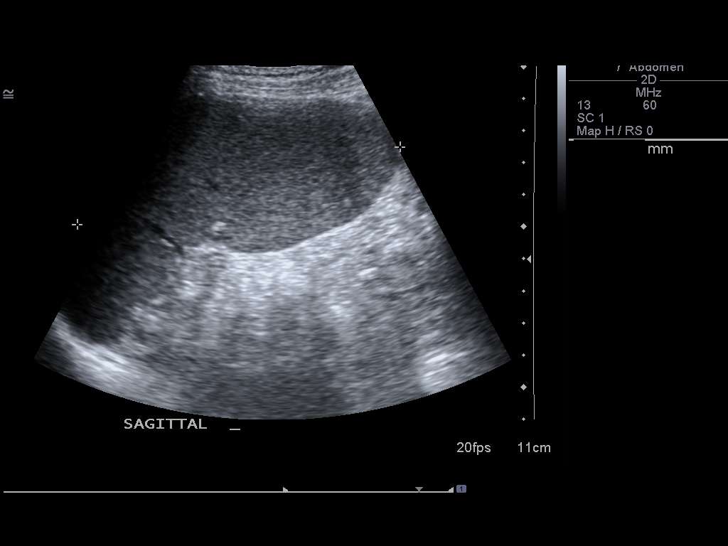
[im 66/79]
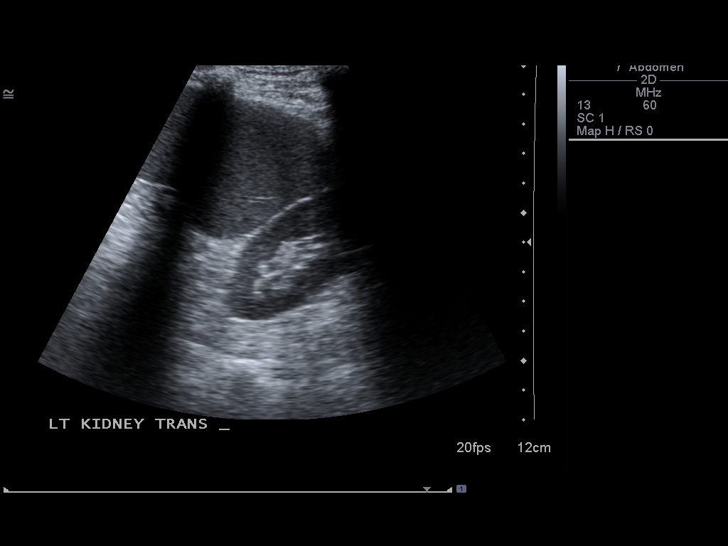
[im 72/79]
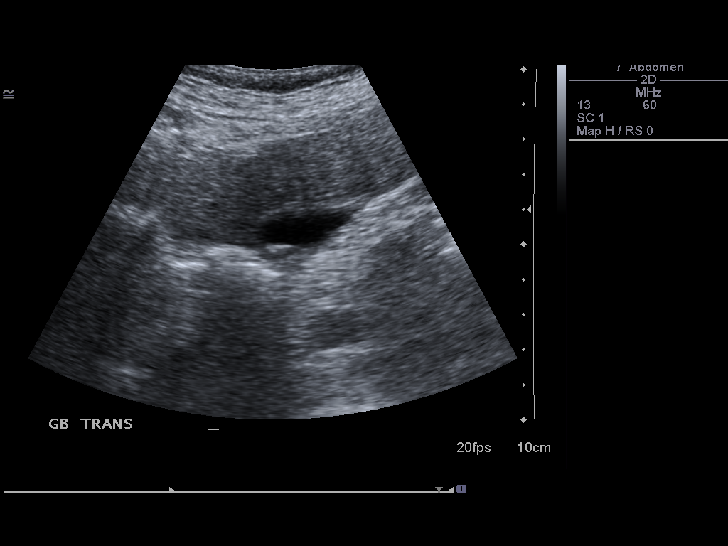
[im 79/79]
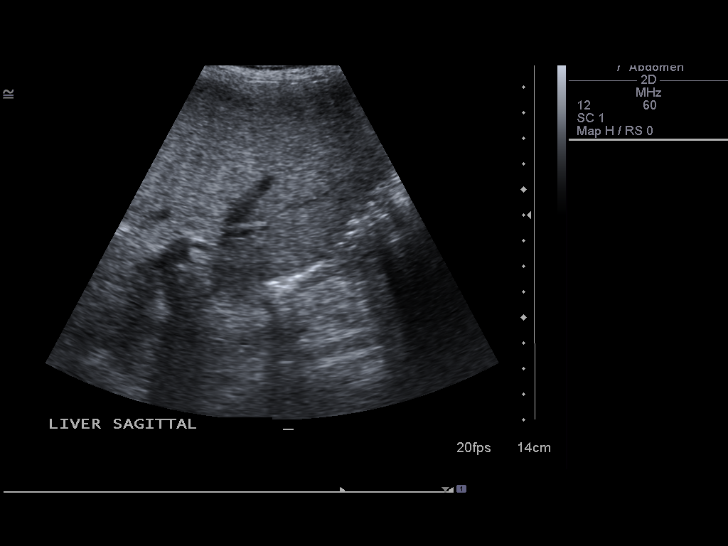

[14 of 25 positions shown; findings below may reference images not displayed]

FINDINGS: Multiple small mobile gallstones are identified without
evidence of acute cholecystitis.
There is no evidence of biliary dilatation and the CBD measures
mm in greatest diameter.
Heterogeneously coarse hepatic echotexture is noted without
definite focal hepatic lesions.
The IVC, pancreas, spleen, kidneys, and abdominal aorta are
unremarkable.
There is no evidence of free fluid.
IMPRESSION: Stable coarse heterogeneous hepatic echotexture which may represent
chronic inflammation / fibrosis.

No evidence of focal hepatic mass.

Cholelithiasis without evidence of cholecystitis.

## 2009-12-22 ENCOUNTER — Encounter (INDEPENDENT_AMBULATORY_CARE_PROVIDER_SITE_OTHER): Payer: Self-pay | Admitting: *Deleted

## 2010-02-22 ENCOUNTER — Encounter: Payer: Self-pay | Admitting: Gastroenterology

## 2010-02-23 ENCOUNTER — Encounter: Payer: Self-pay | Admitting: Infectious Diseases

## 2010-03-01 LAB — CONVERTED CEMR LAB
ALT: 25 units/L (ref 0–53)
AST: 64 units/L — ABNORMAL HIGH (ref 0–37)
Albumin: 4.2 g/dL (ref 3.5–5.2)
Alkaline Phosphatase: 104 units/L (ref 39–117)
BUN: 10 mg/dL (ref 6–23)
Basophils Absolute: 0.1 10*3/uL (ref 0.0–0.1)
Basophils Relative: 1 % (ref 0–1)
CD4 Count: 980 microliters
CO2: 23 meq/L (ref 19–32)
Calcium: 9.3 mg/dL (ref 8.4–10.5)
Chloride: 106 meq/L (ref 96–112)
Cholesterol: 189 mg/dL (ref 0–200)
Glucose, Bld: 121 mg/dL — ABNORMAL HIGH (ref 70–99)
HIV-1 RNA Quant, Log: 2.07 — ABNORMAL HIGH (ref <1.70)
Hemoglobin: 16.2 g/dL (ref 13.0–17.0)
Lymphocytes Relative: 34 % (ref 12–46)
Lymphs Abs: 3 10*3/uL (ref 0.7–3.3)
MCV: 97.1 fL (ref 78.0–100.0)
Monocytes Absolute: 0.9 10*3/uL — ABNORMAL HIGH (ref 0.2–0.7)
Monocytes Relative: 10 % (ref 3–11)
Platelets: 176 10*3/uL (ref 150–400)
Protein, ur: NEGATIVE mg/dL
Sodium: 139 meq/L (ref 135–145)
VLDL: 39 mg/dL (ref 0–40)
pH: 7 (ref 5.0–8.0)

## 2010-03-05 NOTE — Miscellaneous (Signed)
  Clinical Lists Changes  Observations: Added new observation of YEARAIDSPOS: 1995  (12/22/2009 13:24)
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
## 2010-03-05 NOTE — Miscellaneous (Signed)
Summary: clinical update/ryan white  Clinical Lists Changes  Observations: Added new observation of YEARLYEXPEN: 3495  (07/01/2009 16:24) Added new observation of PCTFPL: 289.24  (07/01/2009 16:24) Added new observation of HOUSEINCOME: 16109  (07/01/2009 16:24) Added new observation of FINASSESSDT: 07/01/2009  (07/01/2009 16:24)

## 2010-03-05 NOTE — Miscellaneous (Signed)
Summary: RW Update  Clinical Lists Changes  Observations: Added new observation of HIV STATUS: CDC-defined AIDS (03/05/2009 16:25) 

## 2010-03-30 ENCOUNTER — Other Ambulatory Visit (INDEPENDENT_AMBULATORY_CARE_PROVIDER_SITE_OTHER): Payer: BC Managed Care – PPO

## 2010-03-30 ENCOUNTER — Encounter: Payer: Self-pay | Admitting: Adult Health

## 2010-03-30 ENCOUNTER — Other Ambulatory Visit: Payer: Self-pay | Admitting: Adult Health

## 2010-03-30 DIAGNOSIS — B20 Human immunodeficiency virus [HIV] disease: Secondary | ICD-10-CM

## 2010-03-30 LAB — CONVERTED CEMR LAB
ALT: 24 units/L (ref 0–53)
Albumin: 4.1 g/dL (ref 3.5–5.2)
CO2: 25 meq/L (ref 19–32)
Calcium: 9.4 mg/dL (ref 8.4–10.5)
Chloride: 105 meq/L (ref 96–112)
Eosinophils Absolute: 0.2 10*3/uL (ref 0.0–0.7)
Glucose, Bld: 99 mg/dL (ref 70–99)
HIV 1 RNA Quant: <20 copies/mL (ref <20)
HIV-1 RNA Quant, Log: <1.3 (ref <1.30)
Lymphocytes Relative: 37 % (ref 12–46)
Lymphs Abs: 2.6 10*3/uL (ref 0.7–4.0)
MCV: 93.9 fL (ref 78.0–100.0)
Neutro Abs: 3.1 10*3/uL (ref 1.7–7.7)
Neutrophils Relative %: 44 % (ref 43–77)
Platelets: 237 10*3/uL (ref 150–400)
Potassium: 4.1 meq/L (ref 3.5–5.3)
RBC: 4.58 M/uL (ref 4.22–5.81)
Sodium: 140 meq/L (ref 135–145)
Total Protein: 7.7 g/dL (ref 6.0–8.3)
WBC: 7.1 10*3/uL (ref 4.0–10.5)

## 2010-03-31 LAB — T-HELPER CELL (CD4) - (RCID CLINIC ONLY): CD4 % Helper T Cell: 39 % (ref 33–55)

## 2010-04-13 ENCOUNTER — Encounter: Payer: Self-pay | Admitting: Adult Health

## 2010-04-13 ENCOUNTER — Ambulatory Visit (INDEPENDENT_AMBULATORY_CARE_PROVIDER_SITE_OTHER): Payer: BC Managed Care – PPO | Admitting: Adult Health

## 2010-04-13 DIAGNOSIS — B1710 Acute hepatitis C without hepatic coma: Secondary | ICD-10-CM

## 2010-04-13 DIAGNOSIS — B20 Human immunodeficiency virus [HIV] disease: Secondary | ICD-10-CM

## 2010-04-13 LAB — CONVERTED CEMR LAB
Bacteria, UA: NONE SEEN
Blood, UA: NEGATIVE
Chlamydia, Swab/Urine, PCR: NEGATIVE
Cholesterol: 194 mg/dL (ref 0–200)
GC Probe Amp, Urine: NEGATIVE
HCV Quantitative: 8210000 intl units/mL — ABNORMAL HIGH (ref <43)
Nitrite: NEGATIVE
Specific Gravity, Urine: 1.025 (ref 1.005–1.030)
Urobilinogen, UA: 1 (ref 0.0–1.0)
VLDL: 26 mg/dL (ref 0–40)

## 2010-04-20 LAB — T-HELPER CELL (CD4) - (RCID CLINIC ONLY): CD4 T Cell Abs: 1300 uL (ref 400–2700)
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
## 2010-04-23 ENCOUNTER — Emergency Department (HOSPITAL_COMMUNITY)
Admission: EM | Admit: 2010-04-23 | Discharge: 2010-04-23 | Disposition: A | Discharge status: Home / Self Care | Payer: No Typology Code available for payment source | Attending: Emergency Medicine | Admitting: Emergency Medicine

## 2010-04-23 DIAGNOSIS — W540XXA Bitten by dog, initial encounter: Secondary | ICD-10-CM | POA: Insufficient documentation

## 2010-04-23 DIAGNOSIS — Z21 Asymptomatic human immunodeficiency virus [HIV] infection status: Secondary | ICD-10-CM | POA: Insufficient documentation

## 2010-04-23 DIAGNOSIS — IMO0002 Reserved for concepts with insufficient information to code with codable children: Secondary | ICD-10-CM | POA: Insufficient documentation

## 2010-04-23 DIAGNOSIS — Z79899 Other long term (current) drug therapy: Secondary | ICD-10-CM | POA: Insufficient documentation

## 2010-04-23 DIAGNOSIS — S51809A Unspecified open wound of unspecified forearm, initial encounter: Secondary | ICD-10-CM | POA: Insufficient documentation

## 2010-05-05 LAB — T-HELPER CELL (CD4) - (RCID CLINIC ONLY): CD4 % Helper T Cell: 43 % (ref 33–55)

## 2010-05-12 LAB — T-HELPER CELL (CD4) - (RCID CLINIC ONLY): CD4 T Cell Abs: 960 uL (ref 400–2700)

## 2010-06-19 NOTE — Op Note (Signed)
Logan Combs, Logan Combs               ACCOUNT NO.:  [id number]   MEDICAL RECORD NO.:  [id number]          PATIENT TYPE:  AMB   LOCATION:  ENDO                         FACILITY:  [facility]   PHYSICIAN:  Petra Kuba, M.D.    DATE OF BIRTH:  1950/07/13   DATE OF PROCEDURE:  11/10/2004  DATE OF DISCHARGE:                                 OPERATIVE REPORT   PROCEDURE:  Colonoscopy with biopsy.   INDICATIONS:  Patient with right-sided abdominal pain, abnormal CT scan for  right-sided colitis, also with gallstones, HIV-positive, currently  undergoing therapy for hepatitis C.  Consent was signed after risks,  benefits, methods, options thoroughly discussed in the office.   MEDICINES USED:  Demerol 50 mg, Versed 5 mg.   PROCEDURE:  Rectal inspection was pertinent for external hemorrhoids, small.  Digital exam was negative.  The video pediatric adjustable colonoscope was  inserted, easily advanced around the colon to the cecum.  This did require  some abdominal pressure but no position changes.  No abnormalities were seen  on insertion except for some left-sided diverticula.  The cecum was  identified by the appendiceal orifice and the ileocecal valve.  In fact, the  scope was inserted a short way into the terminal ileum, which was normal.  Photo documentation was obtained.  Random biopsies of both TI were obtained  and random biopsies of colon were obtained and put in a second container to  rule out any microscopic abnormality.  Slow withdrawal through the colon.  Prep was adequate.  There was some liquid stool that required washing and  suctioning, but on slow withdrawal through the colon other than the left-  sided diverticula, mostly in the sigmoid, no other abnormalities were seen,  specifically no polyps, tumors, masses signs of bleeding or any colitis.  Random biopsies were obtained as above.  Once back in the rectum, anorectal  pull-through and retroflexion confirmed some small  hemorrhoids.  The scope  was straightened and readvanced a short way up the left side of the colon,  air was suctioned, scope removed.  The patient tolerated the procedure well.  There was no obvious immediate complication.   ENDOSCOPIC DIAGNOSES:  1.  Internal-external hemorrhoids.  2.  Left-sided diverticula, mostly in the sigmoid.  3.  Otherwise within normal limits to the terminal ileum without any signs      of colitis, status post random biopsies throughout.   PLAN:  Await pathology.  If doing well medically, repeat screening in five  years.  If pain continues and think it is a GI origin, consider a PIPIDA or  empiric cholecystectomy.  If a cholecystectomy is done, may even want to get  a liver biopsy at that juncture.  Happy to see back p.r.n., otherwise return  care to Drs. Roxan Hockey and Foy Guadalajara for the customary healthcare maintenance.           ______________________________  Petra Kuba, M.D.     MEM/MEDQ  D:  11/10/2004  T:  11/10/2004  Job:  [id number]   cc:   Bertrum Sol, M.D.  [place], [place]  Arelia Longest. Michelle Nasuti., M.D.  Fax: [phone]

## 2010-07-07 ENCOUNTER — Other Ambulatory Visit: Payer: Self-pay | Admitting: *Deleted

## 2010-07-07 DIAGNOSIS — F329 Major depressive disorder, single episode, unspecified: Secondary | ICD-10-CM

## 2010-07-07 DIAGNOSIS — B20 Human immunodeficiency virus [HIV] disease: Secondary | ICD-10-CM

## 2010-07-07 MED ORDER — EFAVIRENZ-EMTRICITAB-TENOFOVIR 600-200-300 MG PO TABS: 1.0000 | ORAL_TABLET | Freq: Every day | ORAL | Status: DC

## 2010-07-07 MED ORDER — SERTRALINE HCL 100 MG PO TABS: 100.0000 mg | ORAL_TABLET | Freq: Every day | ORAL | Status: DC

## 2010-07-14 ENCOUNTER — Telehealth: Payer: Self-pay | Admitting: *Deleted

## 2010-07-14 ENCOUNTER — Other Ambulatory Visit (INDEPENDENT_AMBULATORY_CARE_PROVIDER_SITE_OTHER): Payer: BC Managed Care – PPO

## 2010-07-14 DIAGNOSIS — B20 Human immunodeficiency virus [HIV] disease: Secondary | ICD-10-CM

## 2010-07-14 NOTE — Telephone Encounter (Signed)
He was here for labs & enquired about his refills. I went thru several numbers & people at [facility]. No -one could find the refill I did on 07/07/10. I finally got a pharmacist & gave the orders verbally.  they will over night the atripla & zoloft.  He spoke with the billing people & gave them his card for the overnight fee of $23. [facility] is in the red file.  [id number] [facility] [place].

## 2010-07-15 LAB — COMPLETE METABOLIC PANEL WITH GFR
ALT: 19 U/L (ref 0–53)
CO2: 27 mEq/L (ref 19–32)
Calcium: 11.5 mg/dL — ABNORMAL HIGH (ref 8.4–10.5)
Chloride: 102 mEq/L (ref 96–112)
GFR, Est African American: >60 mL/min (ref >60)
GFR, Est Non African American: >60 mL/min (ref >60)
Glucose, Bld: 128 mg/dL — ABNORMAL HIGH (ref 70–99)
Sodium: 141 mEq/L (ref 135–145)
Total Bilirubin: 1 mg/dL (ref 0.3–1.2)
Total Protein: 7.9 g/dL (ref 6.0–8.3)

## 2010-07-15 LAB — CBC WITH DIFFERENTIAL/PLATELET
Basophils Absolute: 0.1 10*3/uL (ref 0.0–0.1)
Basophils Relative: 1 % (ref 0–1)
Eosinophils Absolute: 0.2 10*3/uL (ref 0.0–0.7)
Eosinophils Relative: 3 % (ref 0–5)
HCT: 47.3 % (ref 39.0–52.0)
Lymphocytes Relative: 33 % (ref 12–46)
MCH: 33.3 pg (ref 26.0–34.0)
MCHC: 34.7 g/dL (ref 30.0–36.0)
MCV: 95.9 fL (ref 78.0–100.0)
Monocytes Absolute: 0.7 10*3/uL (ref 0.1–1.0)
RDW: 13.5 % (ref 11.5–15.5)

## 2010-07-15 LAB — T-HELPER CELL (CD4) - (RCID CLINIC ONLY): CD4 T Cell Abs: 830 uL (ref 400–2700)

## 2010-07-28 ENCOUNTER — Ambulatory Visit (INDEPENDENT_AMBULATORY_CARE_PROVIDER_SITE_OTHER): Payer: BC Managed Care – PPO | Admitting: Adult Health

## 2010-07-28 ENCOUNTER — Encounter: Payer: Self-pay | Admitting: Adult Health

## 2010-07-28 DIAGNOSIS — B20 Human immunodeficiency virus [HIV] disease: Secondary | ICD-10-CM

## 2010-07-28 DIAGNOSIS — B1710 Acute hepatitis C without hepatic coma: Secondary | ICD-10-CM

## 2010-07-28 DIAGNOSIS — Z79899 Other long term (current) drug therapy: Secondary | ICD-10-CM

## 2010-07-28 NOTE — Progress Notes (Signed)
  Subjective:    Patient ID: Logan Combs, male    DOB: 1951-01-10, 60 y.o.   MRN: [id number]  HPI Presents to clinic for scheduled followup. States missed 5 days of his antiretroviral therapy due to running out of refills, but otherwise, he has been adherent with his medications with good tolerance and no complications. Voices no complaints today.   Review of Systems  Constitutional: Negative.   HENT: Negative.   Eyes: Negative.   Respiratory: Negative.   Cardiovascular: Negative.   Gastrointestinal: Negative.   Genitourinary: Negative.   Musculoskeletal: Negative.   Skin: Negative.   Neurological: Negative.   Hematological: Negative.   Psychiatric/Behavioral: Negative.       Objective:   Physical Exam  Constitutional: He is oriented to person, place, and time. He appears well-developed and well-nourished. No distress.  HENT:  Head: Normocephalic and atraumatic.  Eyes: Conjunctivae and EOM are normal. Pupils are equal, round, and reactive to light.  Neck: Normal range of motion. Neck supple.  Cardiovascular: Normal rate and regular rhythm.   Pulmonary/Chest: Effort normal and breath sounds normal.  Abdominal: Soft. Bowel sounds are normal.  Musculoskeletal: Normal range of motion.  Neurological: He is alert and oriented to person, place, and time. No cranial nerve deficit. Coordination normal.  Skin: Skin is warm and dry.  Psychiatric: He has a normal mood and affect. His behavior is normal. Judgment and thought content normal.          Assessment & Plan:  1. HIV. Labs obtained 07/14/2010 show a CD4 count of 830 at 35% with a viral load of less than 20 copies/mL. Remains stable on current regimen. Recommend continuing present management, repeating fasting labs in 10 weeks with a followup in 3 months.  2. Hypertension. Blood pressure from March of 2012 was 138/88 and blood pressure today was 157/80. Step one management. Should be diet modification, low sodium, low fat  diet and recommend increasing physical activity. Also recommend ASA 81 mg by mouth daily. Recommend he routinely check his blood pressure to determine if there is persistent elevation.  3. HCV. Labs obtained in March showed a hepatitis C viral load of 8,210,000 copies./ML. His last course of interferon was tolerated poorly, but we discussed that there might be new treatments available. In the near future, which we will discuss, once they are available.  He verbally knowledge. All information was provided for him in agreed with plan of care.

## 2010-07-28 NOTE — Patient Instructions (Signed)
Low salt diet Take 81 mg aspirin daily.

## 2010-09-08 ENCOUNTER — Telehealth: Payer: Self-pay | Admitting: *Deleted

## 2010-09-08 NOTE — Telephone Encounter (Signed)
Patient called requesting a letter form [employer]ida Boatman for community service.  Court wants him to work at [employer] and he feels he can not do this.  Spoke with Nida Boatman and he said from an HIV standpoint he should be OK for this type of work and he can not write the letter.  Offered patient a copy of his office note and this was printed for patient to pick up. Wendall Mola CMA

## 2010-10-09 ENCOUNTER — Other Ambulatory Visit: Payer: BC Managed Care – PPO

## 2010-10-09 DIAGNOSIS — B20 Human immunodeficiency virus [HIV] disease: Secondary | ICD-10-CM

## 2010-10-09 DIAGNOSIS — Z79899 Other long term (current) drug therapy: Secondary | ICD-10-CM

## 2010-10-09 LAB — T-HELPER CELL (CD4) - (RCID CLINIC ONLY): CD4 % Helper T Cell: 39 % (ref 33–55)

## 2010-10-09 LAB — CBC WITH DIFFERENTIAL/PLATELET
Basophils Relative: 1 % (ref 0–1)
Eosinophils Absolute: 0.2 10*3/uL (ref 0.0–0.7)
Eosinophils Relative: 4 % (ref 0–5)
HCT: 42.2 % (ref 39.0–52.0)
Hemoglobin: 15.1 g/dL (ref 13.0–17.0)
Lymphs Abs: 1.8 10*3/uL (ref 0.7–4.0)
MCH: 33.3 pg (ref 26.0–34.0)
MCHC: 35.8 g/dL (ref 30.0–36.0)
MCV: 93 fL (ref 78.0–100.0)
Monocytes Absolute: 0.6 10*3/uL (ref 0.1–1.0)
Monocytes Relative: 10 % (ref 3–12)
Neutrophils Relative %: 53 % (ref 43–77)
RBC: 4.54 MIL/uL (ref 4.22–5.81)

## 2010-10-09 LAB — COMPLETE METABOLIC PANEL WITH GFR
Alkaline Phosphatase: 82 U/L (ref 39–117)
BUN: 11 mg/dL (ref 6–23)
CO2: 23 mEq/L (ref 19–32)
Creat: 0.98 mg/dL (ref 0.50–1.35)
GFR, Est African American: >60 mL/min (ref >60)
GFR, Est Non African American: >60 mL/min (ref >60)
Glucose, Bld: 119 mg/dL — ABNORMAL HIGH (ref 70–99)
Total Bilirubin: 0.6 mg/dL (ref 0.3–1.2)
Total Protein: 7.6 g/dL (ref 6.0–8.3)

## 2010-10-09 LAB — LIPID PANEL
Cholesterol: 183 mg/dL (ref 0–200)
LDL Cholesterol: 97 mg/dL (ref 0–99)
Triglycerides: 161 mg/dL — ABNORMAL HIGH (ref <150)
VLDL: 32 mg/dL (ref 0–40)

## 2010-10-13 LAB — HIV-1 RNA QUANT-NO REFLEX-BLD
HIV 1 RNA Quant: <20 {copies}/mL
HIV-1 RNA Quant, Log: <1.3 {Log}

## 2010-10-22 ENCOUNTER — Ambulatory Visit: Payer: BC Managed Care – PPO | Admitting: Adult Health

## 2010-10-27 LAB — T-HELPER CELL (CD4) - (RCID CLINIC ONLY)
CD4 % Helper T Cell: 30 — ABNORMAL LOW
CD4 T Cell Abs: 780

## 2010-11-10 LAB — T-HELPER CELL (CD4) - (RCID CLINIC ONLY): CD4 T Cell Abs: 940

## 2010-11-19 LAB — T-HELPER CELL (CD4) - (RCID CLINIC ONLY): CD4 % Helper T Cell: 34

## 2011-02-19 ENCOUNTER — Other Ambulatory Visit: Payer: Self-pay | Admitting: Internal Medicine

## 2011-02-19 DIAGNOSIS — Z113 Encounter for screening for infections with a predominantly sexual mode of transmission: Secondary | ICD-10-CM

## 2011-02-23 ENCOUNTER — Other Ambulatory Visit (INDEPENDENT_AMBULATORY_CARE_PROVIDER_SITE_OTHER): Payer: BC Managed Care – PPO

## 2011-02-23 DIAGNOSIS — Z113 Encounter for screening for infections with a predominantly sexual mode of transmission: Secondary | ICD-10-CM

## 2011-02-23 DIAGNOSIS — B20 Human immunodeficiency virus [HIV] disease: Secondary | ICD-10-CM

## 2011-02-24 LAB — CBC WITH DIFFERENTIAL/PLATELET
Basophils Absolute: 0.1 10*3/uL (ref 0.0–0.1)
Basophils Relative: 1 % (ref 0–1)
Eosinophils Absolute: 0.2 10*3/uL (ref 0.0–0.7)
Eosinophils Relative: 2 % (ref 0–5)
HCT: 44.7 % (ref 39.0–52.0)
MCHC: 34.5 g/dL (ref 30.0–36.0)
MCV: 98.2 fL (ref 78.0–100.0)
Monocytes Absolute: 0.7 10*3/uL (ref 0.1–1.0)
Platelets: 259 10*3/uL (ref 150–400)
RDW: 13.5 % (ref 11.5–15.5)
WBC: 7.4 10*3/uL (ref 4.0–10.5)

## 2011-02-24 LAB — COMPLETE METABOLIC PANEL WITH GFR
ALT: 22 U/L (ref 0–53)
AST: 58 U/L — ABNORMAL HIGH (ref 0–37)
Alkaline Phosphatase: 89 U/L (ref 39–117)
BUN: 15 mg/dL (ref 6–23)
Calcium: 9.2 mg/dL (ref 8.4–10.5)
Creat: 1.1 mg/dL (ref 0.50–1.35)
Total Bilirubin: 0.7 mg/dL (ref 0.3–1.2)

## 2011-02-24 LAB — RPR

## 2011-02-24 LAB — T-HELPER CELL (CD4) - (RCID CLINIC ONLY)
CD4 % Helper T Cell: 46 % (ref 33–55)
CD4 T Cell Abs: 1100 uL (ref 400–2700)

## 2011-02-25 LAB — HIV-1 RNA QUANT-NO REFLEX-BLD
HIV 1 RNA Quant: <20 copies/mL (ref <20)
HIV-1 RNA Quant, Log: <1.3 {Log} (ref <1.30)

## 2011-03-09 ENCOUNTER — Encounter: Payer: Self-pay | Admitting: Internal Medicine

## 2011-03-09 ENCOUNTER — Ambulatory Visit (INDEPENDENT_AMBULATORY_CARE_PROVIDER_SITE_OTHER): Payer: BC Managed Care – PPO | Admitting: Internal Medicine

## 2011-03-09 VITALS — BP 180/103 | HR 68 | Temp 98.4°F | Ht 70.0 in | Wt 134.5 lb

## 2011-03-09 DIAGNOSIS — K130 Diseases of lips: Secondary | ICD-10-CM

## 2011-03-09 DIAGNOSIS — Z299 Encounter for prophylactic measures, unspecified: Secondary | ICD-10-CM

## 2011-03-09 DIAGNOSIS — L0889 Other specified local infections of the skin and subcutaneous tissue: Secondary | ICD-10-CM

## 2011-03-09 DIAGNOSIS — Z23 Encounter for immunization: Secondary | ICD-10-CM

## 2011-03-09 MED ORDER — NYSTATIN 100000 UNIT/GM EX CREA: TOPICAL_CREAM | Freq: Two times a day (BID) | CUTANEOUS | Status: DC

## 2011-03-09 NOTE — Patient Instructions (Signed)
Please research the DASH diet for management of elevated blood pressure. Also please record her blood pressure once or twice daily and come back in one month so we can review what your blood pressure readings have been.

## 2011-03-09 NOTE — Progress Notes (Signed)
Patient ID: Logan Combs, male   DOB: 14-Aug-1950, 61 y.o.   MRN: [id number]  INFECTIOUS DISEASE PROGRESS NOTE    Subjective: Logan Combs is in for his routine visit. He is followed by Logan Combs for his HIV infection. He states that he rarely misses a dose of his Atripla and when he does he tries to remember to take it in the morning. He has been stressed out recently because his partner is on hemodialysis and needs to undergo inguinal hernia surgery soon. He has been stressed but not depressed. He has been bothered by some cracking at the corners of his mouth which she tends to get in the colder, winter months. Nystatin cream helps. He does recall that his blood pressure has been slightly elevated on visits over the past one to 2 years.  Objective:   General: He is a healthy-appearing man in no distress Skin: He has a little bit of cracking at the corner of his lips Lungs: Clear Cor: Regular S1 and S2 no murmurs Abdomen: Soft and nontender   Lab Results Lab Results  Component Value Date   WBC 7.4 02/23/2011   HGB 15.4 02/23/2011   HCT 44.7 02/23/2011   MCV 98.2 02/23/2011   PLT 259 02/23/2011    Lab Results  Component Value Date   CREATININE 1.10 02/23/2011   BUN 15 02/23/2011   NA 140 02/23/2011   K 3.7 02/23/2011   CL 104 02/23/2011   CO2 26 02/23/2011    Lab Results  Component Value Date   ALT 22 02/23/2011   AST 58* 02/23/2011   ALKPHOS 89 02/23/2011   BILITOT 0.7 02/23/2011      HIV 1 RNA Quant (copies/mL)  Date Value  02/23/2011 <20   10/09/2010 <20   07/14/2010 <20      CD4 T Cell Abs (cmm)  Date Value  02/23/2011 1100   10/09/2010 720   07/14/2010 830      Assessment: His HIV infection remains under excellent control. His blood pressure is elevated. I talked to him at length about the DASH diet and asked him to do research on that to see if he would be willing to try some lifestyle modifications. He states that he does use salt fairly liberally when he is cooking. His  partner has a blood pressure monitor at home and I've asked him to take his blood pressure one to 2 times daily and return in one month so that we can review those readings.  Plan: 1. Continue Atripla 2. Continue Zoloft 3. Try to institute lifestyle modifications and monitor blood pressure at home 4. Return to clinic in one month   Cliffton Asters, MD [facility] for Infectious Diseases [facility] [phone] pager   [phone] cell 03/09/2011, 9:55 AM

## 2011-03-11 ENCOUNTER — Other Ambulatory Visit: Payer: Self-pay | Admitting: *Deleted

## 2011-03-11 NOTE — Telephone Encounter (Signed)
rec'd a fax from [facility]. They needed to confirm the nystatin rx. This is to be sent from their [place], [place] center for mail order. His other meds for HIV need to go thru the [place] center. Since this was sent to [place], I had to give the rx to the pharmacist so the right place could fill it & mail to the pt

## 2011-04-06 ENCOUNTER — Ambulatory Visit (INDEPENDENT_AMBULATORY_CARE_PROVIDER_SITE_OTHER): Payer: BC Managed Care – PPO | Admitting: Internal Medicine

## 2011-04-06 ENCOUNTER — Encounter: Payer: Self-pay | Admitting: Internal Medicine

## 2011-04-06 VITALS — BP 137/84 | HR 60 | Temp 98.1°F | Ht 70.0 in | Wt 135.5 lb

## 2011-04-06 DIAGNOSIS — R739 Hyperglycemia, unspecified: Secondary | ICD-10-CM

## 2011-04-06 DIAGNOSIS — R7309 Other abnormal glucose: Secondary | ICD-10-CM

## 2011-04-06 DIAGNOSIS — F329 Major depressive disorder, single episode, unspecified: Secondary | ICD-10-CM

## 2011-04-06 LAB — LIPID PANEL
HDL: 51 mg/dL (ref >39)
LDL Cholesterol: 109 mg/dL — ABNORMAL HIGH (ref 0–99)
Triglycerides: 184 mg/dL — ABNORMAL HIGH (ref <150)

## 2011-04-06 MED ORDER — SERTRALINE HCL 100 MG PO TABS: 100.0000 mg | ORAL_TABLET | Freq: Every day | ORAL | Status: DC

## 2011-04-06 NOTE — Progress Notes (Signed)
Patient ID: Logan Combs, male   DOB: [birth year], 61 y.o.   MRN: [id number]  INFECTIOUS DISEASE PROGRESS NOTE    Subjective: Logan Combs is in for his routine visit. He remains stressed over his partners HD and pending hernia surgery but not depressed. He does note if he stops or decreases his Zoloft over the past few years and feels it helps his depression. He has been monitoring his BPs at home and notes they have improved. He has researched the DASH diet and has tried to follow it closely.He knows that diet helps his partner as well. He has not missed any of his Atripla. [employer] has been bought by [employer] and he will start new insurance this month.  Objective: Temp: 98.1 F (36.7 C) (03/05 1110) Temp src: Oral (03/05 1110) BP: 137/84 mmHg (03/05 1110) Pulse Rate: 60  (03/05 1110)  General: good spirits Oral: no perleche, mouth clear Skin: no rash  Lungs: clear Cor: reg S1 and S2 without murmurs Abdomen: nontender   Lab Results HIV 1 RNA Quant (copies/mL)  Date Value  02/23/2011 <20   10/09/2010 <20   07/14/2010 <20      CD4 T Cell Abs (cmm)  Date Value  02/23/2011 1100   10/09/2010 720   07/14/2010 830      Assessment: His HIV remains well controlled.  His home BPs show improvement to goal.  His glucose is up so I will check a HbA1c and lipid panel.   Plan: 1. Continue current meds 2. Check Hba1c and lipid panel 3. Return in 3 months   Cliffton Asters, MD [facility] for Infectious Diseases [facility] [phone] pager   [phone] cell 04/06/2011, 12:22 PM

## 2011-04-07 ENCOUNTER — Other Ambulatory Visit: Payer: Self-pay | Admitting: *Deleted

## 2011-04-07 DIAGNOSIS — F329 Major depressive disorder, single episode, unspecified: Secondary | ICD-10-CM

## 2011-04-07 MED ORDER — SERTRALINE HCL 100 MG PO TABS: 100.0000 mg | ORAL_TABLET | Freq: Every day | ORAL | Status: DC

## 2011-04-09 ENCOUNTER — Other Ambulatory Visit: Payer: Self-pay | Admitting: *Deleted

## 2011-04-09 DIAGNOSIS — F329 Major depressive disorder, single episode, unspecified: Secondary | ICD-10-CM

## 2011-04-09 MED ORDER — SERTRALINE HCL 100 MG PO TABS: 100.0000 mg | ORAL_TABLET | Freq: Every day | ORAL | Status: DC

## 2011-04-12 ENCOUNTER — Other Ambulatory Visit: Payer: Self-pay | Admitting: *Deleted

## 2011-04-12 DIAGNOSIS — F329 Major depressive disorder, single episode, unspecified: Secondary | ICD-10-CM

## 2011-04-12 MED ORDER — SERTRALINE HCL 100 MG PO TABS: 100.0000 mg | ORAL_TABLET | Freq: Every day | ORAL | Status: DC

## 2011-04-20 ENCOUNTER — Other Ambulatory Visit: Payer: Self-pay | Admitting: Internal Medicine

## 2011-04-20 DIAGNOSIS — B20 Human immunodeficiency virus [HIV] disease: Secondary | ICD-10-CM

## 2011-07-12 ENCOUNTER — Other Ambulatory Visit: Payer: BC Managed Care – PPO

## 2011-07-13 ENCOUNTER — Other Ambulatory Visit: Payer: BC Managed Care – PPO

## 2011-07-13 DIAGNOSIS — B20 Human immunodeficiency virus [HIV] disease: Secondary | ICD-10-CM

## 2011-07-13 DIAGNOSIS — R739 Hyperglycemia, unspecified: Secondary | ICD-10-CM

## 2011-07-13 LAB — COMPREHENSIVE METABOLIC PANEL
ALT: 26 U/L (ref 0–53)
AST: 74 U/L — ABNORMAL HIGH (ref 0–37)
Chloride: 103 mEq/L (ref 96–112)
Creat: 1.12 mg/dL (ref 0.50–1.35)
Total Bilirubin: 0.7 mg/dL (ref 0.3–1.2)

## 2011-07-13 LAB — HEMOGLOBIN A1C: Hgb A1c MFr Bld: 5.9 % — ABNORMAL HIGH (ref <5.7)

## 2011-07-14 LAB — HIV-1 RNA QUANT-NO REFLEX-BLD
HIV 1 RNA Quant: 26 copies/mL — ABNORMAL HIGH (ref <20)
HIV-1 RNA Quant, Log: 1.41 {Log} — ABNORMAL HIGH (ref <1.30)

## 2011-07-14 LAB — T-HELPER CELL (CD4) - (RCID CLINIC ONLY)
CD4 % Helper T Cell: 46 % (ref 33–55)
CD4 T Cell Abs: 990 uL (ref 400–2700)

## 2011-07-27 ENCOUNTER — Ambulatory Visit (INDEPENDENT_AMBULATORY_CARE_PROVIDER_SITE_OTHER): Payer: BC Managed Care – PPO | Admitting: Internal Medicine

## 2011-07-27 ENCOUNTER — Other Ambulatory Visit: Payer: Self-pay | Admitting: Licensed Clinical Social Worker

## 2011-07-27 VITALS — BP 120/78 | HR 59 | Temp 98.6°F | Ht 70.0 in | Wt 129.5 lb

## 2011-07-27 DIAGNOSIS — B20 Human immunodeficiency virus [HIV] disease: Secondary | ICD-10-CM

## 2011-07-27 DIAGNOSIS — L282 Other prurigo: Secondary | ICD-10-CM

## 2011-07-27 DIAGNOSIS — F329 Major depressive disorder, single episode, unspecified: Secondary | ICD-10-CM

## 2011-07-27 MED ORDER — TRIAMCINOLONE ACETONIDE 0.5 % EX CREA: TOPICAL_CREAM | Freq: Three times a day (TID) | CUTANEOUS | Status: DC

## 2011-07-27 MED ORDER — SERTRALINE HCL 100 MG PO TABS: 100.0000 mg | ORAL_TABLET | Freq: Every day | ORAL | Status: DC

## 2011-07-27 MED ORDER — EFAVIRENZ-EMTRICITAB-TENOFOVIR 600-200-300 MG PO TABS: 1.0000 | ORAL_TABLET | Freq: Every day | ORAL | Status: DC

## 2011-07-27 NOTE — Progress Notes (Signed)
Patient ID: Logan Combs, male   DOB: [birth year], 61 y.o.   MRN: [id number]     [facility] for Infectious Disease  Patient Active Problem List  Diagnosis  . HIV DISEASE  . HEPATITIS C  . DEPRESSION, MILD  . ACUTE BRONCHITIS  . GYNECOMASTIA  . UNSPECIFIED PRURITIC DISORDER  . NIGHT SWEATS  . WEIGHT LOSS  . Perleche  . Hyperglycemia  . Pruritic rash    Patient's Medications  New Prescriptions   TRIAMCINOLONE CREAM (KENALOG) 0.5 %    Apply topically 3 (three) times daily.  Previous Medications   EFAVIRENZ-EMTRICTABINE-TENOFOVIR (ATRIPLA) 600-200-300 MG PER TABLET    Take 1 tablet by mouth at bedtime.   SERTRALINE (ZOLOFT) 100 MG TABLET    Take 1 tablet (100 mg total) by mouth daily.   SILDENAFIL (VIAGRA) 100 MG TABLET    Take 100 mg by mouth as needed.    Modified Medications   No medications on file  Discontinued Medications   NYSTATIN CREAM (MYCOSTATIN)    Apply topically 2 (two) times daily.    Subjective: Logan Combs is in for his routine visit. He believes he is missed one or 2 doses of his Atripla each month when he falls asleep before taking it. Overall he is feeling better with less stress but has been bothered by a pruritic rash over the last month. This is a new problem for him. He is not feeling depressed.  Objective: Temp: 98.6 F (37 C) (06/25 0924) Temp src: Oral (06/25 0924) BP: 120/78 mmHg (06/25 0924) Pulse Rate: 59  (06/25 0924)  General: He is calm and in no distress  Skin: He has excoriated lesions on his arms and legs Lungs: Clear Cor: Regular S1 and S2 no murmurs Abdomen: Nontender   Lab Results HIV 1 RNA Quant (copies/mL)  Date Value  07/13/2011 26*  02/23/2011 <20   10/09/2010 <20      CD4 T Cell Abs (cmm)  Date Value  07/13/2011 990   02/23/2011 1100   10/09/2010 720      Assessment: His HIV infection remains under very good control. I've asked him to set up a system reminders so that he does not miss doses of Atripla.  It is hard to  tell what the primary skin lesion is. His pruritus may be related to his chronic hepatitis C. He thinks he may have been diagnosed with eczema many many years ago.  His depression is well controlled.  Plan: 1. Continue current medications 2. Triamcinolone cream 3. Followup after blood work in 6 months   Cliffton Asters, MD [facility] for Infectious Disease [facility] [phone] pager   [phone] cell 07/27/2011, 9:49 AM

## 2012-01-03 ENCOUNTER — Other Ambulatory Visit (INDEPENDENT_AMBULATORY_CARE_PROVIDER_SITE_OTHER): Payer: BC Managed Care – PPO

## 2012-01-03 DIAGNOSIS — B20 Human immunodeficiency virus [HIV] disease: Secondary | ICD-10-CM

## 2012-01-03 LAB — COMPREHENSIVE METABOLIC PANEL
ALT: 25 U/L (ref 0–53)
Albumin: 4.4 g/dL (ref 3.5–5.2)
CO2: 26 mEq/L (ref 19–32)
Glucose, Bld: 125 mg/dL — ABNORMAL HIGH (ref 70–99)
Potassium: 4.3 mEq/L (ref 3.5–5.3)
Sodium: 144 mEq/L (ref 135–145)
Total Bilirubin: 0.4 mg/dL (ref 0.3–1.2)
Total Protein: 7.4 g/dL (ref 6.0–8.3)

## 2012-01-03 LAB — CBC
Hemoglobin: 13.6 g/dL (ref 13.0–17.0)
MCH: 29.4 pg (ref 26.0–34.0)
Platelets: 277 10*3/uL (ref 150–400)
RBC: 4.62 MIL/uL (ref 4.22–5.81)
WBC: 5.6 10*3/uL (ref 4.0–10.5)

## 2012-01-03 LAB — LIPID PANEL
Cholesterol: 167 mg/dL (ref 0–200)
LDL Cholesterol: 87 mg/dL (ref 0–99)
Triglycerides: 120 mg/dL (ref <150)
VLDL: 24 mg/dL (ref 0–40)

## 2012-01-04 ENCOUNTER — Other Ambulatory Visit: Payer: BC Managed Care – PPO

## 2012-01-04 LAB — HIV-1 RNA QUANT-NO REFLEX-BLD
HIV 1 RNA Quant: 49 copies/mL — ABNORMAL HIGH (ref <20)
HIV-1 RNA Quant, Log: 1.69 {Log} — ABNORMAL HIGH (ref <1.30)

## 2012-01-04 LAB — T-HELPER CELL (CD4) - (RCID CLINIC ONLY)
CD4 % Helper T Cell: 41 % (ref 33–55)
CD4 T Cell Abs: 720 uL (ref 400–2700)

## 2012-01-18 ENCOUNTER — Telehealth: Payer: Self-pay | Admitting: *Deleted

## 2012-01-18 ENCOUNTER — Ambulatory Visit: Payer: BC Managed Care – PPO | Admitting: Internal Medicine

## 2012-01-18 NOTE — Telephone Encounter (Signed)
Called and left patient a voice mail to call the clinic to reschedule his appt, he no showed today. Riad Wagley  

## 2012-02-09 ENCOUNTER — Ambulatory Visit (INDEPENDENT_AMBULATORY_CARE_PROVIDER_SITE_OTHER): Payer: BC Managed Care – PPO | Admitting: Internal Medicine

## 2012-02-09 ENCOUNTER — Other Ambulatory Visit: Payer: Self-pay | Admitting: *Deleted

## 2012-02-09 ENCOUNTER — Encounter: Payer: Self-pay | Admitting: Internal Medicine

## 2012-02-09 VITALS — BP 185/93 | HR 60 | Ht 71.0 in | Wt 136.0 lb

## 2012-02-09 DIAGNOSIS — Z21 Asymptomatic human immunodeficiency virus [HIV] infection status: Secondary | ICD-10-CM

## 2012-02-09 DIAGNOSIS — B20 Human immunodeficiency virus [HIV] disease: Secondary | ICD-10-CM

## 2012-02-09 DIAGNOSIS — F329 Major depressive disorder, single episode, unspecified: Secondary | ICD-10-CM

## 2012-02-09 DIAGNOSIS — Z23 Encounter for immunization: Secondary | ICD-10-CM

## 2012-02-09 DIAGNOSIS — L282 Other prurigo: Secondary | ICD-10-CM

## 2012-02-09 MED ORDER — LORATADINE 10 MG PO TABS: 10.0000 mg | ORAL_TABLET | Freq: Every day | ORAL | Status: DC

## 2012-02-09 MED ORDER — EFAVIRENZ-EMTRICITAB-TENOFOVIR 600-200-300 MG PO TABS: 1.0000 | ORAL_TABLET | Freq: Every day | ORAL | Status: DC

## 2012-02-09 MED ORDER — SERTRALINE HCL 100 MG PO TABS: 100.0000 mg | ORAL_TABLET | Freq: Every day | ORAL | Status: DC

## 2012-02-09 NOTE — Progress Notes (Signed)
Patient ID: Logan Combs, male   DOB: 25-May-1950, 62 y.o.   MRN: [id number]     [facility] for Infectious Disease  Patient Active Problem List  Diagnosis  . HIV DISEASE  . HEPATITIS C  . DEPRESSION, MILD  . GYNECOMASTIA  . UNSPECIFIED PRURITIC DISORDER  . WEIGHT LOSS  . Perleche  . Hyperglycemia  . Pruritic rash    Patient's Medications  New Prescriptions   LORATADINE (CLARITIN) 10 MG TABLET    Take 1 tablet (10 mg total) by mouth daily.  Previous Medications   MENTHOL-ZINC OXIDE (GOLD BOND) POWDER    Apply topically 2 (two) times daily.  Modified Medications   Modified Medication Previous Medication   EFAVIRENZ-EMTRICITABINE-TENOFOVIR (ATRIPLA) 600-200-300 MG PER TABLET efavirenz-emtrictabine-tenofovir (ATRIPLA) 600-200-300 MG per tablet      Take 1 tablet by mouth at bedtime.    Take 1 tablet by mouth at bedtime.   SERTRALINE (ZOLOFT) 100 MG TABLET sertraline (ZOLOFT) 100 MG tablet      Take 1 tablet (100 mg total) by mouth daily.    Take 1 tablet (100 mg total) by mouth daily.  Discontinued Medications   SILDENAFIL (VIAGRA) 100 MG TABLET    Take 100 mg by mouth as needed.     TRIAMCINOLONE CREAM (KENALOG) 0.5 %    Apply topically 3 (three) times daily.    Subjective: Logan Combs is in for his routine visit. He thinks he may be missed one or 2 doses of his Atripla since his last visit. He states that overall he is feeling better. His partner, Jeannett Senior, had an uneventful hernia repair and switched from hemodialysis to peritoneal dialysis at home. That has greatly reduced his stress. He is not feeling depressed. He continues to be bothered by pruritus on his legs and to a lesser degree on his arms. He tried using triamcinolone cream but did not notice that this helped. He is currently using Gold Bond cream with minimal improvement.  Objective: BP: 185/93 mmHg (01/08 1534) Pulse Rate: 60  (01/08 1534)  General: He is in good spirits Skin: He has extensive excoriated lesions over  his lower legs. A primary lesion has not noted Lungs: Clear Cor: Regular S1 and S2 no murmurs  Lab Results HIV 1 RNA Quant (copies/mL)  Date Value  01/03/2012 49*  07/13/2011 26*  02/23/2011 <20      CD4 T Cell Abs (cmm)  Date Value  01/03/2012 720   07/13/2011 990   02/23/2011 1100      Assessment: His viral load has been barely detectable on his last 2 visits but his CD4 count remains well up in the normal range. I encouraged him to try not to Miss a single dose of Atripla.  His depression is under good control.  And not sure the cause of his chronic pruritus. It may be related to his hepatitis C. I will have him try antihistamines  He is interested in establishing primary care.  Plan: 1. Continue current Atripla 2. Try loratadine for pruritus 3. Encouraged to establish primary care 4. Followup after lab work in 3 months   Cliffton Asters, MD [facility] for Infectious Disease [facility] [phone] pager   [phone] cell 02/09/2012, 4:50 PM

## 2012-04-04 ENCOUNTER — Ambulatory Visit (INDEPENDENT_AMBULATORY_CARE_PROVIDER_SITE_OTHER): Payer: BC Managed Care – PPO | Admitting: Internal Medicine

## 2012-04-04 ENCOUNTER — Encounter: Payer: Self-pay | Admitting: Internal Medicine

## 2012-04-04 ENCOUNTER — Other Ambulatory Visit: Payer: Self-pay | Admitting: *Deleted

## 2012-04-04 VITALS — BP 133/74 | HR 76 | Temp 98.9°F | Ht 71.0 in | Wt 136.5 lb

## 2012-04-04 DIAGNOSIS — F329 Major depressive disorder, single episode, unspecified: Secondary | ICD-10-CM

## 2012-04-04 DIAGNOSIS — B20 Human immunodeficiency virus [HIV] disease: Secondary | ICD-10-CM

## 2012-04-04 DIAGNOSIS — L03119 Cellulitis of unspecified part of limb: Secondary | ICD-10-CM

## 2012-04-04 DIAGNOSIS — L03115 Cellulitis of right lower limb: Secondary | ICD-10-CM | POA: Insufficient documentation

## 2012-04-04 LAB — COMPREHENSIVE METABOLIC PANEL
ALT: 10 U/L (ref 0–53)
Albumin: 4 g/dL (ref 3.5–5.2)
CO2: 27 mEq/L (ref 19–32)
Calcium: 9.5 mg/dL (ref 8.4–10.5)
Chloride: 102 mEq/L (ref 96–112)
Glucose, Bld: 105 mg/dL — ABNORMAL HIGH (ref 70–99)
Potassium: 3.7 mEq/L (ref 3.5–5.3)
Sodium: 134 mEq/L — ABNORMAL LOW (ref 135–145)
Total Bilirubin: 1 mg/dL (ref 0.3–1.2)
Total Protein: 7.3 g/dL (ref 6.0–8.3)

## 2012-04-04 LAB — LIPID PANEL
Cholesterol: 146 mg/dL (ref 0–200)
Triglycerides: 118 mg/dL (ref <150)

## 2012-04-04 LAB — CBC
Hemoglobin: 12.7 g/dL — ABNORMAL LOW (ref 13.0–17.0)
MCH: 28.4 pg (ref 26.0–34.0)
RBC: 4.47 MIL/uL (ref 4.22–5.81)
WBC: 18.1 10*3/uL — ABNORMAL HIGH (ref 4.0–10.5)

## 2012-04-04 MED ORDER — SERTRALINE HCL 100 MG PO TABS: 100.0000 mg | ORAL_TABLET | Freq: Every day | ORAL | Status: DC

## 2012-04-04 MED ORDER — EFAVIRENZ-EMTRICITAB-TENOFOVIR 600-200-300 MG PO TABS: 1.0000 | ORAL_TABLET | Freq: Every day | ORAL | Status: DC

## 2012-04-04 MED ORDER — CEPHALEXIN 500 MG PO CAPS: 500.0000 mg | ORAL_CAPSULE | Freq: Four times a day (QID) | ORAL | Status: DC

## 2012-04-04 MED ORDER — DOXYCYCLINE HYCLATE 100 MG PO TABS: 100.0000 mg | ORAL_TABLET | Freq: Two times a day (BID) | ORAL | Status: DC

## 2012-04-04 NOTE — Progress Notes (Signed)
Patient ID: Logan Combs, male   DOB: 1950/02/13, 62 y.o.   MRN: [id number]          [facility] for Infectious Disease  Patient Active Problem List  Diagnosis  . HIV DISEASE  . HEPATITIS C  . DEPRESSION, MILD  . GYNECOMASTIA  . UNSPECIFIED PRURITIC DISORDER  . WEIGHT LOSS  . Perleche  . Hyperglycemia  . Pruritic rash  . Cellulitis of leg, right    Patient's Medications  New Prescriptions   CEPHALEXIN (KEFLEX) 500 MG CAPSULE    Take 1 capsule (500 mg total) by mouth 4 (four) times daily.   DOXYCYCLINE (VIBRA-TABS) 100 MG TABLET    Take 1 tablet (100 mg total) by mouth 2 (two) times daily.  Previous Medications   LORATADINE (CLARITIN) 10 MG TABLET    Take 1 tablet (10 mg total) by mouth daily.  Modified Medications   Modified Medication Previous Medication   EFAVIRENZ-EMTRICITABINE-TENOFOVIR (ATRIPLA) 600-200-300 MG PER TABLET efavirenz-emtricitabine-tenofovir (ATRIPLA) 600-200-300 MG per tablet      Take 1 tablet by mouth at bedtime.    Take 1 tablet by mouth at bedtime.   SERTRALINE (ZOLOFT) 100 MG TABLET sertraline (ZOLOFT) 100 MG tablet      Take 1 tablet (100 mg total) by mouth daily.    Take 1 tablet (100 mg total) by mouth daily.  Discontinued Medications   MENTHOL-ZINC OXIDE (GOLD BOND) POWDER    Apply topically 2 (two) times daily.    Subjective: Logan Combs is seen on a work in basis. One week ago he had sudden onset of fevers up to 101 associated with chills, muscle aches and one episode of nausea and diarrhea. He has continued to have the fever and muscle aches daily since that time. Over the last 4 days he's also developed pain and redness in his right lower leg. He has not missed any of his HIV medications.  Objective: Temp: 98.9 F (37.2 C) (03/04 1127) Temp src: Oral (03/04 1127) BP: 133/74 mmHg (03/04 1127) Pulse Rate: 76 (03/04 1127)  General: he appears uncomfortable Skin: he has chronic excoriations of both lower legs. He has acute erythema, warmth  and swelling of his right lower leg to the level of the knee Lungs: clear Cor: regular S1 and S2 no murmurs Abdomen: soft and nontender  Lab Results HIV 1 RNA Quant (copies/mL)  Date Value  01/03/2012 49*  07/13/2011 26*  02/23/2011 <20      CD4 T Cell Abs (cmm)  Date Value  01/03/2012 720   07/13/2011 990   02/23/2011 1100      Assessment: Logan Combs has acute right lower leg cellulitis. I will treat him with empiric oral cephalexin and doxycycline to cover strep and staph. He has a remote history of penicillin allergy but hasn't tolerated amoxicillin on many occasions over the last decade.  Plan: 1. Continue Atripla 2. Oral cephalexin and doxycycline x1 week 3. Check lab work today   Cliffton Asters, MD [facility] for Infectious Disease [facility] [phone] pager   [phone] cell 04/04/2012, 11:55 AM

## 2012-04-05 LAB — HIV-1 RNA QUANT-NO REFLEX-BLD: HIV 1 RNA Quant: <20 copies/mL (ref <20)

## 2012-04-10 ENCOUNTER — Telehealth: Payer: Self-pay | Admitting: *Deleted

## 2012-04-10 NOTE — Telephone Encounter (Signed)
Patient called requesting a referral to dermatology for a rash all over his body. Explained that we would not have notes to send as he was not seen at this office for this. I did schedule him an appointment at [facility] for 04/27/12 as he felt he would be seen sooner if the request came from his MDs office. Logan Combs

## 2012-05-09 ENCOUNTER — Other Ambulatory Visit: Payer: BC Managed Care – PPO

## 2012-05-09 DIAGNOSIS — L0390 Cellulitis, unspecified: Secondary | ICD-10-CM

## 2012-05-09 DIAGNOSIS — B20 Human immunodeficiency virus [HIV] disease: Secondary | ICD-10-CM

## 2012-05-10 LAB — CBC WITH DIFFERENTIAL/PLATELET
Basophils Absolute: 0.1 10*3/uL (ref 0.0–0.1)
Basophils Relative: 1 % (ref 0–1)
Eosinophils Absolute: 0.2 10*3/uL (ref 0.0–0.7)
Eosinophils Relative: 2 % (ref 0–5)
HCT: 38.5 % — ABNORMAL LOW (ref 39.0–52.0)
Hemoglobin: 13.2 g/dL (ref 13.0–17.0)
MCH: 27.3 pg (ref 26.0–34.0)
MCHC: 34.3 g/dL (ref 30.0–36.0)
MCV: 79.7 fL (ref 78.0–100.0)
Monocytes Absolute: 0.7 10*3/uL (ref 0.1–1.0)
Monocytes Relative: 11 % (ref 3–12)
RDW: 15.2 % (ref 11.5–15.5)

## 2012-05-10 LAB — COMPLETE METABOLIC PANEL WITH GFR
Albumin: 4.4 g/dL (ref 3.5–5.2)
Alkaline Phosphatase: 89 U/L (ref 39–117)
BUN: 12 mg/dL (ref 6–23)
Creat: 1.03 mg/dL (ref 0.50–1.35)
GFR, Est Non African American: 78 mL/min
Glucose, Bld: 137 mg/dL — ABNORMAL HIGH (ref 70–99)
Potassium: 3.9 mEq/L (ref 3.5–5.3)
Total Bilirubin: 0.5 mg/dL (ref 0.3–1.2)

## 2012-05-23 ENCOUNTER — Encounter: Payer: Self-pay | Admitting: Internal Medicine

## 2012-05-23 ENCOUNTER — Ambulatory Visit (INDEPENDENT_AMBULATORY_CARE_PROVIDER_SITE_OTHER): Payer: BC Managed Care – PPO | Admitting: Internal Medicine

## 2012-05-23 VITALS — BP 156/99 | HR 67 | Temp 97.4°F | Ht 71.0 in | Wt 147.5 lb

## 2012-05-23 DIAGNOSIS — L309 Dermatitis, unspecified: Secondary | ICD-10-CM | POA: Insufficient documentation

## 2012-05-23 DIAGNOSIS — B20 Human immunodeficiency virus [HIV] disease: Secondary | ICD-10-CM

## 2012-05-23 DIAGNOSIS — B1920 Unspecified viral hepatitis C without hepatic coma: Secondary | ICD-10-CM

## 2012-05-23 DIAGNOSIS — L259 Unspecified contact dermatitis, unspecified cause: Secondary | ICD-10-CM

## 2012-05-23 NOTE — Progress Notes (Signed)
Patient ID: Logan Combs, male   DOB: 05-21-1950, 62 y.o.   MRN: [id number]          [facility] for Infectious Disease  Patient Active Problem List  Diagnosis  . HIV DISEASE  . HEPATITIS C  . DEPRESSION, MILD  . GYNECOMASTIA  . UNSPECIFIED PRURITIC DISORDER  . WEIGHT LOSS  . Perleche  . Hyperglycemia  . Pruritic rash  . Cellulitis of leg, right    Patient's Medications  New Prescriptions   No medications on file  Previous Medications   EFAVIRENZ-EMTRICITABINE-TENOFOVIR (ATRIPLA) 600-200-300 MG PER TABLET    Take 1 tablet by mouth at bedtime.   LORATADINE (CLARITIN) 10 MG TABLET    Take 1 tablet (10 mg total) by mouth daily.   SERTRALINE (ZOLOFT) 100 MG TABLET    Take 1 tablet (100 mg total) by mouth daily.  Modified Medications   No medications on file  Discontinued Medications   CEPHALEXIN (KEFLEX) 500 MG CAPSULE    Take 1 capsule (500 mg total) by mouth 4 (four) times daily.   DOXYCYCLINE (VIBRA-TABS) 100 MG TABLET    Take 1 tablet (100 mg total) by mouth 2 (two) times daily.    Subjective: Logan Combs is in for his routine visit. He has not missed any doses of his Atripla since his last visit. His right leg cellulitis resolved promptly after I started him on antibiotics at the time of his last visit. He has also been seen by a local dermatologist (he cannot remember her name) who started him on a medicated lotion which has helped his chronic dry skin. He was treated for hepatitis C around 2005 with interferon but had to stop short after several months because he was tolerating it poorly and losing weight rapidly.  Objective: Temp: 97.4 F (36.3 C) (04/22 0951) Temp src: Oral (04/22 0951) BP: 156/99 mmHg (04/22 0951) Pulse Rate: 67 (04/22 0951)  General: He is in good spirits Skin: No acute rash Lungs: Clear Cor: Regular S1 and S2 no murmurs Abdomen: Soft and nontender  Lab Results HIV 1 RNA Quant (copies/mL)  Date Value  04/04/2012 <20   01/03/2012 49*    07/13/2011 26*     CD4 T Cell Abs (cmm)  Date Value  04/04/2012 790   01/03/2012 720   07/13/2011 990      Assessment: His HIV infection remains under excellent control. I will continue Atripla.  His hepatitis C viral load 2 years ago was over 8 million. I will make a referral to the hepatitis C clinic to see if he is a candidate for any of the newer therapies.  Plan: 1. Continue Atripla 2. Hepatitis C clinic referral 3. Followup here in 6 months after lab work   Cliffton Asters, MD [facility] for Infectious Disease [facility] [phone] pager   [phone] cell 05/23/2012, 10:04 AM

## 2012-05-23 NOTE — Addendum Note (Signed)
Addended by: Jennet Maduro D on: 05/23/2012 11:04 AM   Modules accepted: Orders

## 2012-07-13 ENCOUNTER — Telehealth: Payer: Self-pay | Admitting: *Deleted

## 2012-07-13 NOTE — Telephone Encounter (Signed)
Requested pt call [facility] ph # to make new pt appt.

## 2012-07-26 ENCOUNTER — Telehealth: Payer: Self-pay | Admitting: *Deleted

## 2012-07-26 NOTE — Telephone Encounter (Signed)
[facility] confirmed that pt called to make appt.

## 2012-08-29 ENCOUNTER — Other Ambulatory Visit: Payer: Self-pay | Admitting: Internal Medicine

## 2012-08-29 DIAGNOSIS — K7460 Unspecified cirrhosis of liver: Secondary | ICD-10-CM

## 2012-09-06 ENCOUNTER — Ambulatory Visit
Admission: RE | Admit: 2012-09-06 | Discharge: 2012-09-06 | Disposition: A | Discharge status: Home / Self Care | Payer: BC Managed Care – PPO | Source: Ambulatory Visit | Attending: Internal Medicine | Admitting: Internal Medicine

## 2012-09-06 DIAGNOSIS — K7460 Unspecified cirrhosis of liver: Secondary | ICD-10-CM

## 2012-11-08 ENCOUNTER — Other Ambulatory Visit: Payer: BC Managed Care – PPO

## 2012-11-08 DIAGNOSIS — B20 Human immunodeficiency virus [HIV] disease: Secondary | ICD-10-CM

## 2012-11-20 ENCOUNTER — Ambulatory Visit: Payer: BC Managed Care – PPO | Admitting: Internal Medicine

## 2012-11-20 ENCOUNTER — Telehealth: Payer: Self-pay | Admitting: *Deleted

## 2012-11-20 NOTE — Telephone Encounter (Signed)
Left message at patient's home number, re: missed appointment.  Per the man who answered the phone, the patient is at work and forgot about his appointment.  He will have the patient call us. Andree Coss, RN

## 2012-11-22 ENCOUNTER — Ambulatory Visit: Payer: BC Managed Care – PPO | Admitting: Internal Medicine

## 2012-12-07 ENCOUNTER — Other Ambulatory Visit: Payer: Self-pay

## 2012-12-12 ENCOUNTER — Telehealth: Payer: Self-pay | Admitting: *Deleted

## 2012-12-12 NOTE — Telephone Encounter (Signed)
Requested pt call for appts.

## 2013-01-06 ENCOUNTER — Emergency Department (HOSPITAL_COMMUNITY): Payer: BC Managed Care – PPO

## 2013-01-06 ENCOUNTER — Encounter (HOSPITAL_COMMUNITY): Payer: Self-pay | Admitting: Emergency Medicine

## 2013-01-06 ENCOUNTER — Emergency Department (HOSPITAL_COMMUNITY)
Admission: EM | Admit: 2013-01-06 | Discharge: 2013-01-06 | Disposition: A | Discharge status: Home / Self Care | Payer: BC Managed Care – PPO | Attending: Emergency Medicine | Admitting: Emergency Medicine

## 2013-01-06 DIAGNOSIS — Z87891 Personal history of nicotine dependence: Secondary | ICD-10-CM | POA: Insufficient documentation

## 2013-01-06 DIAGNOSIS — Z21 Asymptomatic human immunodeficiency virus [HIV] infection status: Secondary | ICD-10-CM | POA: Insufficient documentation

## 2013-01-06 DIAGNOSIS — M25512 Pain in left shoulder: Secondary | ICD-10-CM

## 2013-01-06 DIAGNOSIS — Z880 Allergy status to penicillin: Secondary | ICD-10-CM | POA: Insufficient documentation

## 2013-01-06 DIAGNOSIS — Y92410 Unspecified street and highway as the place of occurrence of the external cause: Secondary | ICD-10-CM | POA: Insufficient documentation

## 2013-01-06 DIAGNOSIS — S0990XA Unspecified injury of head, initial encounter: Secondary | ICD-10-CM | POA: Insufficient documentation

## 2013-01-06 DIAGNOSIS — M25562 Pain in left knee: Secondary | ICD-10-CM

## 2013-01-06 DIAGNOSIS — S8990XA Unspecified injury of unspecified lower leg, initial encounter: Secondary | ICD-10-CM | POA: Insufficient documentation

## 2013-01-06 DIAGNOSIS — Y9389 Activity, other specified: Secondary | ICD-10-CM | POA: Insufficient documentation

## 2013-01-06 DIAGNOSIS — Z79899 Other long term (current) drug therapy: Secondary | ICD-10-CM | POA: Insufficient documentation

## 2013-01-06 HISTORY — DX: Human immunodeficiency virus (HIV) disease: B20

## 2013-01-06 MED ORDER — IBUPROFEN 400 MG PO TABS
600.0000 mg | ORAL_TABLET | Freq: Once | ORAL | Status: AC
  Administered 2013-01-06: 12:00:00 600 mg via ORAL
  Filled 2013-01-06 (×2): qty 1

## 2013-01-06 MED ORDER — TRAMADOL HCL 50 MG PO TABS: 50.0000 mg | ORAL_TABLET | Freq: Three times a day (TID) | ORAL | Status: DC | PRN

## 2013-01-06 NOTE — ED Notes (Signed)
PA at bedside.

## 2013-01-06 NOTE — ED Provider Notes (Signed)
Medical screening examination/treatment/procedure(s) were performed by non-physician practitioner and as supervising physician I was immediately available for consultation/collaboration.  EKG Interpretation   None        Doug Sou, MD 01/06/13 [phone]
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
## 2013-02-28 ENCOUNTER — Other Ambulatory Visit: Payer: Self-pay | Admitting: *Deleted

## 2013-02-28 DIAGNOSIS — F32A Depression, unspecified: Secondary | ICD-10-CM

## 2013-02-28 DIAGNOSIS — F329 Major depressive disorder, single episode, unspecified: Secondary | ICD-10-CM

## 2013-02-28 MED ORDER — SERTRALINE HCL 100 MG PO TABS: 100.0000 mg | ORAL_TABLET | Freq: Every day | ORAL | Status: DC

## 2013-03-01 ENCOUNTER — Other Ambulatory Visit: Payer: Self-pay | Admitting: Licensed Clinical Social Worker

## 2013-03-01 DIAGNOSIS — B20 Human immunodeficiency virus [HIV] disease: Secondary | ICD-10-CM

## 2013-03-01 MED ORDER — EFAVIRENZ-EMTRICITAB-TENOFOVIR 600-200-300 MG PO TABS: 1.0000 | ORAL_TABLET | Freq: Every day | ORAL | Status: DC

## 2013-07-09 ENCOUNTER — Other Ambulatory Visit: Payer: BC Managed Care – PPO

## 2013-07-09 DIAGNOSIS — Z79899 Other long term (current) drug therapy: Secondary | ICD-10-CM

## 2013-07-09 DIAGNOSIS — Z113 Encounter for screening for infections with a predominantly sexual mode of transmission: Secondary | ICD-10-CM

## 2013-07-09 DIAGNOSIS — B20 Human immunodeficiency virus [HIV] disease: Secondary | ICD-10-CM

## 2013-07-09 LAB — CBC WITH DIFFERENTIAL/PLATELET
BASOS ABS: 0 10*3/uL (ref 0.0–0.1)
BASOS PCT: 1 % (ref 0–1)
Eosinophils Absolute: 0 10*3/uL (ref 0.0–0.7)
Eosinophils Relative: 1 % (ref 0–5)
HCT: 32.7 % — ABNORMAL LOW (ref 39.0–52.0)
HEMOGLOBIN: 10.9 g/dL — AB (ref 13.0–17.0)
LYMPHS PCT: 33 % (ref 12–46)
Lymphs Abs: 1.5 10*3/uL (ref 0.7–4.0)
MCH: 23.2 pg — ABNORMAL LOW (ref 26.0–34.0)
MCHC: 33.3 g/dL (ref 30.0–36.0)
MCV: 69.7 fL — ABNORMAL LOW (ref 78.0–100.0)
MONO ABS: 0.6 10*3/uL (ref 0.1–1.0)
Monocytes Relative: 13 % — ABNORMAL HIGH (ref 3–12)
NEUTROS ABS: 2.4 10*3/uL (ref 1.7–7.7)
NEUTROS PCT: 52 % (ref 43–77)
Platelets: 340 10*3/uL (ref 150–400)
RBC: 4.69 MIL/uL (ref 4.22–5.81)
RDW: 19.2 % — AB (ref 11.5–15.5)
WBC: 4.6 10*3/uL (ref 4.0–10.5)

## 2013-07-10 LAB — COMPLETE METABOLIC PANEL WITH GFR
ALK PHOS: 141 U/L — AB (ref 39–117)
ALT: 32 U/L (ref 0–53)
AST: 98 U/L — AB (ref 0–37)
Albumin: 3.9 g/dL (ref 3.5–5.2)
BUN: 13 mg/dL (ref 6–23)
CALCIUM: 9.1 mg/dL (ref 8.4–10.5)
CO2: 20 mEq/L (ref 19–32)
Chloride: 109 mEq/L (ref 96–112)
Creat: 1.05 mg/dL (ref 0.50–1.35)
GFR, Est African American: 88 mL/min
GFR, Est Non African American: 76 mL/min
Glucose, Bld: 136 mg/dL — ABNORMAL HIGH (ref 70–99)
Potassium: 3.6 mEq/L (ref 3.5–5.3)
SODIUM: 137 meq/L (ref 135–145)
TOTAL PROTEIN: 7.2 g/dL (ref 6.0–8.3)
Total Bilirubin: 0.6 mg/dL (ref 0.2–1.2)

## 2013-07-10 LAB — T-HELPER CELL (CD4) - (RCID CLINIC ONLY)
CD4 % Helper T Cell: 47 % (ref 33–55)
CD4 T Cell Abs: 700 /uL (ref 400–2700)

## 2013-07-10 LAB — HIV-1 RNA QUANT-NO REFLEX-BLD: HIV-1 RNA Quant, Log: <1.3 {Log} (ref <1.30)

## 2013-07-10 LAB — LIPID PANEL
CHOL/HDL RATIO: 2.9 ratio
Cholesterol: 152 mg/dL (ref 0–200)
HDL: 52 mg/dL (ref >39)
LDL CALC: 85 mg/dL (ref 0–99)
TRIGLYCERIDES: 75 mg/dL (ref <150)
VLDL: 15 mg/dL (ref 0–40)

## 2013-07-10 LAB — RPR

## 2013-07-23 ENCOUNTER — Encounter: Payer: Self-pay | Admitting: Internal Medicine

## 2013-07-23 ENCOUNTER — Ambulatory Visit (INDEPENDENT_AMBULATORY_CARE_PROVIDER_SITE_OTHER): Payer: BC Managed Care – PPO | Admitting: Internal Medicine

## 2013-07-23 VITALS — BP 170/102 | HR 60 | Temp 97.8°F | Wt 125.0 lb

## 2013-07-23 DIAGNOSIS — F32A Depression, unspecified: Secondary | ICD-10-CM

## 2013-07-23 DIAGNOSIS — M545 Low back pain, unspecified: Secondary | ICD-10-CM

## 2013-07-23 DIAGNOSIS — F3289 Other specified depressive episodes: Secondary | ICD-10-CM

## 2013-07-23 DIAGNOSIS — Z789 Other specified health status: Secondary | ICD-10-CM | POA: Insufficient documentation

## 2013-07-23 DIAGNOSIS — F1010 Alcohol abuse, uncomplicated: Secondary | ICD-10-CM

## 2013-07-23 DIAGNOSIS — M549 Dorsalgia, unspecified: Secondary | ICD-10-CM | POA: Insufficient documentation

## 2013-07-23 DIAGNOSIS — F329 Major depressive disorder, single episode, unspecified: Secondary | ICD-10-CM | POA: Insufficient documentation

## 2013-07-23 DIAGNOSIS — D509 Iron deficiency anemia, unspecified: Secondary | ICD-10-CM | POA: Insufficient documentation

## 2013-07-23 MED ORDER — SERTRALINE HCL 100 MG PO TABS: 100.0000 mg | ORAL_TABLET | Freq: Every day | ORAL | Status: DC

## 2013-07-23 NOTE — Progress Notes (Signed)
Patient ID: Logan Combs, male   DOB: 11/08/1950, 63 y.o.   MRN: [id number]          Patient Active Problem List   Diagnosis Date Noted  . HIV DISEASE 11/03/2005    Priority: High  . Depression 07/23/2013  . Alcohol consumption heavy 07/23/2013  . Microcytic anemia 07/23/2013  . Back pain 07/23/2013  . Eczema 05/23/2012  . Cellulitis of leg, right 04/04/2012  . Pruritic rash 07/27/2011  . Hyperglycemia 04/06/2011  . Perleche 03/09/2011  . GYNECOMASTIA 06/25/2008  . WEIGHT LOSS 10/25/2007  . UNSPECIFIED PRURITIC DISORDER 06/06/2007  . HEPATITIS C 11/03/2005    Patient's Medications  New Prescriptions   No medications on file  Previous Medications   EFAVIRENZ-EMTRICITABINE-TENOFOVIR (ATRIPLA) 600-200-300 MG PER TABLET    Take 1 tablet by mouth at bedtime.   HALCINONIDE (HALOG) 0.1 % CREA    Apply topically. Dr. Allyson Sabal, dermatology   TRAMADOL (ULTRAM) 50 MG TABLET    Take 1 tablet (50 mg total) by mouth every 8 (eight) hours as needed.   TRIAMCINOLONE CREAM (KENALOG) 0.1 %    Apply topically 2 (two) times daily.  Modified Medications   Modified Medication Previous Medication   SERTRALINE (ZOLOFT) 100 MG TABLET sertraline (ZOLOFT) 100 MG tablet      Take 1 tablet (100 mg total) by mouth daily.    Take 1 tablet (100 mg total) by mouth daily.  Discontinued Medications   No medications on file    Subjective: Allah is in for his first visit since March of last year. He says this been under a great deal of stress recently and was laid off from his job at [employer]. He is worried about losing his insurance but will be applying for a new [employer] and hopes to have coverage and not have any gaps. He states the stress has caused him to feel somewhat depressed. He admits to drinking alcohol to excess was about 5-6 beers daily. He recently missed about a week of his Atripla when he simply forgot. He has been out of his sertraline for several months.  Review of  Systems: Constitutional: positive for anorexia, malaise and weight loss, negative for chills, fevers and sweats Eyes: negative Ears, nose, mouth, throat, and face: negative Respiratory: negative Cardiovascular: negative Gastrointestinal: negative Genitourinary:negative Musculoskeletal:positive for back pain Behavioral/Psych: positive for depression, negative for anxiety  Past Medical History  Diagnosis Date  . HIV (human immunodeficiency virus infection)     History  Substance Use Topics  . Smoking status: Former Smoker    Quit date: 02/26/2010  . Smokeless tobacco: Never Used  . Alcohol Use: 10.5 oz/week    21 drink(s) per week    No family history on file.  Allergies  Allergen Reactions  . Penicillins Anaphylaxis    Objective: Temp: 97.8 F (36.6 C) (06/22 1413) Temp src: Oral (06/22 1413) BP: 170/102 mmHg (06/22 1413) Pulse Rate: 60 (06/22 1413) Body mass index is 17.44 kg/(m^2).  General: His weight is down 15 pounds to 125 Oral: No oropharyngeal lesions Skin: Ecchymoses on forearms Lungs: Clear Cor: Regular S1 and S2 with no murmurs Abdomen: Soft and nontender Joints and extremities: Normal Neuro: Alert with normal speech and conversation Affect: Flat  Lab Results Lab Results  Component Value Date   WBC 4.6 07/09/2013   HGB 10.9* 07/09/2013   HCT 32.7* 07/09/2013   MCV 69.7* 07/09/2013   PLT 340 07/09/2013    Lab Results  Component Value Date   CREATININE 1.05 07/09/2013   BUN 13 07/09/2013   NA 137 07/09/2013   K 3.6 07/09/2013   CL 109 07/09/2013   CO2 20 07/09/2013    Lab Results  Component Value Date   ALT 32 07/09/2013   AST 98* 07/09/2013   ALKPHOS 141* 07/09/2013   BILITOT 0.6 07/09/2013    Lab Results  Component Value Date   CHOL 152 07/09/2013   HDL 52 07/09/2013   LDLCALC 85 07/09/2013   TRIG 75 07/09/2013   CHOLHDL 2.9 07/09/2013    Lab Results HIV 1 RNA Quant (copies/mL)  Date Value  07/09/2013 <20   11/08/2012 <20   04/04/2012 <20      CD4 T Cell Abs  (/uL)  Date Value  07/09/2013 700   11/08/2012 1000   04/04/2012 790      Assessment: Fortunately his HIV infection remains under very good control but overall he hasn't declined with depression, excessive alcohol use, weight loss and new microcytic anemia. His also hypertensive.  Plan: 1. Continue Atripla for now and try not to miss any doses 2. Encouraged alcohol abstinence 3. Restart sertraline 4. Refer for mental health counseling 5. He will followup with his primary care physician for his blood pressure 6. Follow up in 6 weeks   Michel Bickers, MD [facility] for [place] [phone] pager   [phone] cell 07/23/2013, 5:09 PM

## 2013-07-31 ENCOUNTER — Ambulatory Visit: Payer: BC Managed Care – PPO | Admitting: *Deleted

## 2013-07-31 DIAGNOSIS — F329 Major depressive disorder, single episode, unspecified: Secondary | ICD-10-CM

## 2013-07-31 DIAGNOSIS — Z789 Other specified health status: Secondary | ICD-10-CM

## 2013-07-31 DIAGNOSIS — F32A Depression, unspecified: Secondary | ICD-10-CM

## 2013-07-31 NOTE — Progress Notes (Signed)
Patient ID: MINOR IDEN, male   DOB: October 29, 1950, 63 y.o.   MRN: [id number]  Counselor met with client today for the first time.  Client was alert and oriented X four. Client was well dressed and groomed.  Client was soft spoken but made good eye contact.  Client shared with counselor that he lost his job the 3rd of June and since then has started drinking heavier and heavier.  Client admits to drinking about a 6 pack of beer a night.  Client stated that he is not only having difficulty accepting his job loss of 23 years but is not getting along with his partner.  Client shared that he is having major interpersonal problems with his partner over money issues and general living arrangements.  Client stated that he has been with the same partner for 42 years.  Client shared that his partner is adding to his problems as a result of experiencing major health problems himself including but not limited to dialysis three times a week and becoming less mobile.  Counselor suggested to client that maybe bringing his partner in for a dual session might be advantageous to his current situation at home.  Client shared openly and honestly today about his life and his ongoing drinking. Client was transparent and gave good insight and feedback to counselor.  Rolena Infante LPCA, MA [facility]

## 2013-08-14 ENCOUNTER — Ambulatory Visit: Payer: BC Managed Care – PPO | Admitting: *Deleted

## 2013-08-22 ENCOUNTER — Ambulatory Visit: Payer: BC Managed Care – PPO | Admitting: *Deleted

## 2013-08-28 ENCOUNTER — Ambulatory Visit: Payer: BC Managed Care – PPO | Admitting: Internal Medicine

## 2013-09-03 ENCOUNTER — Ambulatory Visit: Payer: BC Managed Care – PPO | Admitting: Internal Medicine

## 2013-09-11 ENCOUNTER — Ambulatory Visit (INDEPENDENT_AMBULATORY_CARE_PROVIDER_SITE_OTHER): Payer: No Typology Code available for payment source | Admitting: Internal Medicine

## 2013-09-11 ENCOUNTER — Encounter: Payer: Self-pay | Admitting: Internal Medicine

## 2013-09-11 VITALS — BP 177/82 | HR 54 | Temp 97.6°F | Ht 71.0 in | Wt 122.2 lb

## 2013-09-11 DIAGNOSIS — B20 Human immunodeficiency virus [HIV] disease: Secondary | ICD-10-CM

## 2013-09-11 NOTE — Progress Notes (Signed)
Patient ID: Logan Combs, male   DOB: [birth year], 63 y.o.   MRN: [id number]          Patient Active Problem List   Diagnosis Date Noted  . HIV DISEASE 11/03/2005    Priority: High  . Depression 07/23/2013  . Alcohol consumption heavy 07/23/2013  . Microcytic anemia 07/23/2013  . Back pain 07/23/2013  . Eczema 05/23/2012  . Cellulitis of leg, right 04/04/2012  . Pruritic rash 07/27/2011  . Hyperglycemia 04/06/2011  . Perleche 03/09/2011  . GYNECOMASTIA 06/25/2008  . WEIGHT LOSS 10/25/2007  . UNSPECIFIED PRURITIC DISORDER 06/06/2007  . HEPATITIS C 11/03/2005    Patient's Medications  New Prescriptions   No medications on file  Previous Medications   EFAVIRENZ-EMTRICITABINE-TENOFOVIR (ATRIPLA) 600-200-300 MG PER TABLET    Take 1 tablet by mouth at bedtime.   HALCINONIDE (HALOG) 0.1 % CREA    Apply topically. Dr. Allyson Sabal, dermatology   SERTRALINE (ZOLOFT) 100 MG TABLET    Take 1 tablet (100 mg total) by mouth daily.   TRAMADOL (ULTRAM) 50 MG TABLET    Take 1 tablet (50 mg total) by mouth every 8 (eight) hours as needed.  Modified Medications   No medications on file  Discontinued Medications   TRIAMCINOLONE CREAM (KENALOG) 0.1 %    Apply topically 2 (two) times daily.    Subjective: Logan Combs is in for his routine visit. He recalls missing 2 or 3 doses of his Atripla since his last visit. He states that he feels much better since restarting Zoloft after his last visit. Review of Systems: Pertinent items are noted in HPI.  Past Medical History  Diagnosis Date  . HIV (human immunodeficiency virus infection)     History  Substance Use Topics  . Smoking status: Former Smoker    Quit date: 02/26/2010  . Smokeless tobacco: Never Used  . Alcohol Use: 10.5 oz/week    21 drink(s) per week     Comment: 4-12oz beers / day    No family history on file.  Allergies  Allergen Reactions  . Penicillins Anaphylaxis    Objective: Temp: 97.6 F (36.4 C) (08/11 0905) Temp src:  Oral (08/11 0905) BP: 177/82 mmHg (08/11 0905) Pulse Rate: 54 (08/11 0905) Body mass index is 17.06 kg/(m^2).  General: He is in good spirits Oral: Upper dentures. No oropharyngeal lesions Skin: No change in extensive bruising over her forearms Lungs: Clear Cor: Regular S1 and S2 with no murmurs  Lab Results Lab Results  Component Value Date   WBC 4.6 07/09/2013   HGB 10.9* 07/09/2013   HCT 32.7* 07/09/2013   MCV 69.7* 07/09/2013   PLT 340 07/09/2013    Lab Results  Component Value Date   CREATININE 1.05 07/09/2013   BUN 13 07/09/2013   NA 137 07/09/2013   K 3.6 07/09/2013   CL 109 07/09/2013   CO2 20 07/09/2013    Lab Results  Component Value Date   ALT 32 07/09/2013   AST 98* 07/09/2013   ALKPHOS 141* 07/09/2013   BILITOT 0.6 07/09/2013    Lab Results  Component Value Date   CHOL 152 07/09/2013   HDL 52 07/09/2013   LDLCALC 85 07/09/2013   TRIG 75 07/09/2013   CHOLHDL 2.9 07/09/2013    Lab Results HIV 1 RNA Quant (copies/mL)  Date Value  07/09/2013 <20   11/08/2012 <20   04/04/2012 <20      CD4 T Cell Abs (/uL)  Date Value  07/09/2013 700  11/08/2012 1000   04/04/2012 790      Assessment: His HIV infection remains under excellent control.  Plan: 1. Continue Atripla 2. Followup after lab work in [place] months   Michel Bickers, MD [facility] for [place] [phone] pager   [phone] cell 09/11/2013, 9:24 AM

## 2013-09-13 ENCOUNTER — Other Ambulatory Visit: Payer: Self-pay | Admitting: *Deleted

## 2013-09-13 DIAGNOSIS — B20 Human immunodeficiency virus [HIV] disease: Secondary | ICD-10-CM

## 2013-09-13 MED ORDER — EFAVIRENZ-EMTRICITAB-TENOFOVIR 600-200-300 MG PO TABS: 1.0000 | ORAL_TABLET | Freq: Every day | ORAL | Status: DC

## 2013-10-12 ENCOUNTER — Other Ambulatory Visit: Payer: Self-pay | Admitting: Licensed Clinical Social Worker

## 2013-10-12 DIAGNOSIS — B20 Human immunodeficiency virus [HIV] disease: Secondary | ICD-10-CM

## 2013-10-12 DIAGNOSIS — F329 Major depressive disorder, single episode, unspecified: Secondary | ICD-10-CM

## 2013-10-12 DIAGNOSIS — F32A Depression, unspecified: Secondary | ICD-10-CM

## 2013-10-12 MED ORDER — EFAVIRENZ-EMTRICITAB-TENOFOVIR 600-200-300 MG PO TABS: 1.0000 | ORAL_TABLET | Freq: Every day | ORAL | Status: DC

## 2013-10-12 MED ORDER — SERTRALINE HCL 100 MG PO TABS: 100.0000 mg | ORAL_TABLET | Freq: Every day | ORAL | Status: DC

## 2014-02-12 ENCOUNTER — Ambulatory Visit
Admission: RE | Admit: 2014-02-12 | Discharge: 2014-02-12 | Disposition: A | Discharge status: Home / Self Care | Payer: 59 | Source: Ambulatory Visit | Attending: Family Medicine | Admitting: Family Medicine

## 2014-02-12 ENCOUNTER — Other Ambulatory Visit: Payer: Self-pay | Admitting: Family Medicine

## 2014-02-12 DIAGNOSIS — M25571 Pain in right ankle and joints of right foot: Secondary | ICD-10-CM

## 2014-03-12 ENCOUNTER — Other Ambulatory Visit: Payer: 59

## 2014-03-12 DIAGNOSIS — B20 Human immunodeficiency virus [HIV] disease: Secondary | ICD-10-CM

## 2014-03-12 DIAGNOSIS — Z79899 Other long term (current) drug therapy: Secondary | ICD-10-CM

## 2014-03-12 DIAGNOSIS — Z113 Encounter for screening for infections with a predominantly sexual mode of transmission: Secondary | ICD-10-CM

## 2014-03-12 LAB — COMPREHENSIVE METABOLIC PANEL
ALK PHOS: 212 U/L — AB (ref 39–117)
ALT: 19 U/L (ref 0–53)
AST: 77 U/L — AB (ref 0–37)
Albumin: 2.9 g/dL — ABNORMAL LOW (ref 3.5–5.2)
BUN: 13 mg/dL (ref 6–23)
CO2: 22 meq/L (ref 19–32)
Calcium: 8.9 mg/dL (ref 8.4–10.5)
Chloride: 109 mEq/L (ref 96–112)
Creat: 0.94 mg/dL (ref 0.50–1.35)
GLUCOSE: 126 mg/dL — AB (ref 70–99)
POTASSIUM: 3.8 meq/L (ref 3.5–5.3)
Sodium: 139 mEq/L (ref 135–145)
Total Bilirubin: 0.9 mg/dL (ref 0.2–1.2)
Total Protein: 7 g/dL (ref 6.0–8.3)

## 2014-03-12 LAB — CBC
HEMATOCRIT: 36 % — AB (ref 39.0–52.0)
Hemoglobin: 11.6 g/dL — ABNORMAL LOW (ref 13.0–17.0)
MCH: 25.5 pg — ABNORMAL LOW (ref 26.0–34.0)
MCHC: 32.2 g/dL (ref 30.0–36.0)
MCV: 79.1 fL (ref 78.0–100.0)
MPV: 9 fL (ref 8.6–12.4)
Platelets: 343 10*3/uL (ref 150–400)
RBC: 4.55 MIL/uL (ref 4.22–5.81)
RDW: 16.6 % — ABNORMAL HIGH (ref 11.5–15.5)
WBC: 6 10*3/uL (ref 4.0–10.5)

## 2014-03-12 LAB — LIPID PANEL
CHOLESTEROL: 150 mg/dL (ref 0–200)
HDL: 32 mg/dL — ABNORMAL LOW (ref >39)
LDL Cholesterol: 63 mg/dL (ref 0–99)
Total CHOL/HDL Ratio: 4.7 Ratio
Triglycerides: 276 mg/dL — ABNORMAL HIGH (ref <150)
VLDL: 55 mg/dL — ABNORMAL HIGH (ref 0–40)

## 2014-03-13 LAB — T-HELPER CELL (CD4) - (RCID CLINIC ONLY)
CD4 T CELL ABS: 670 /uL (ref 400–2700)
CD4 T CELL HELPER: 48 % (ref 33–55)

## 2014-03-13 LAB — RPR

## 2014-03-13 LAB — HIV-1 RNA QUANT-NO REFLEX-BLD
HIV 1 RNA Quant: <20 copies/mL (ref <20)
HIV-1 RNA Quant, Log: <1.3 {Log} (ref <1.30)

## 2014-03-26 ENCOUNTER — Encounter: Payer: Self-pay | Admitting: Internal Medicine

## 2014-03-26 ENCOUNTER — Ambulatory Visit (INDEPENDENT_AMBULATORY_CARE_PROVIDER_SITE_OTHER): Payer: 59 | Admitting: Internal Medicine

## 2014-03-26 ENCOUNTER — Other Ambulatory Visit: Payer: Self-pay | Admitting: *Deleted

## 2014-03-26 VITALS — BP 158/100 | HR 66 | Temp 97.9°F | Wt 123.8 lb

## 2014-03-26 DIAGNOSIS — L309 Dermatitis, unspecified: Secondary | ICD-10-CM

## 2014-03-26 DIAGNOSIS — B20 Human immunodeficiency virus [HIV] disease: Secondary | ICD-10-CM

## 2014-03-26 DIAGNOSIS — F329 Major depressive disorder, single episode, unspecified: Secondary | ICD-10-CM

## 2014-03-26 DIAGNOSIS — F32A Depression, unspecified: Secondary | ICD-10-CM

## 2014-03-26 MED ORDER — SERTRALINE HCL 100 MG PO TABS: 100.0000 mg | ORAL_TABLET | Freq: Every day | ORAL | Status: DC

## 2014-03-26 MED ORDER — EFAVIRENZ-EMTRICITAB-TENOFOVIR 600-200-300 MG PO TABS: 1.0000 | ORAL_TABLET | Freq: Every day | ORAL | Status: DC

## 2014-03-26 MED ORDER — HALCINONIDE 0.1 % EX CREA: 1.0000 "application " | TOPICAL_CREAM | Freq: Every day | CUTANEOUS | Status: DC

## 2014-03-26 NOTE — Progress Notes (Signed)
Patient ID: Logan Combs, male   DOB: 1950-10-25, 64 y.o.   MRN: [id number]          Patient Active Problem List   Diagnosis Date Noted  . Human immunodeficiency virus (HIV) disease 11/03/2005    Priority: High  . Depression 07/23/2013  . Alcohol consumption heavy 07/23/2013  . Microcytic anemia 07/23/2013  . Back pain 07/23/2013  . Eczema 05/23/2012  . Cellulitis of leg, right 04/04/2012  . Pruritic rash 07/27/2011  . Hyperglycemia 04/06/2011  . Perleche 03/09/2011  . GYNECOMASTIA 06/25/2008  . WEIGHT LOSS 10/25/2007  . UNSPECIFIED PRURITIC DISORDER 06/06/2007  . HEPATITIS C 11/03/2005    Patient's Medications  New Prescriptions   No medications on file  Previous Medications   EFAVIRENZ-EMTRICITABINE-TENOFOVIR (ATRIPLA) 600-200-300 MG PER TABLET    Take 1 tablet by mouth at bedtime.   HALCINONIDE (HALOG) 0.1 % CREA    Apply topically. Dr. Allyson Sabal, dermatology   TRAMADOL (ULTRAM) 50 MG TABLET    Take 1 tablet (50 mg total) by mouth every 8 (eight) hours as needed.  Modified Medications   Modified Medication Previous Medication   SERTRALINE (ZOLOFT) 100 MG TABLET sertraline (ZOLOFT) 100 MG tablet      Take 1 tablet (100 mg total) by mouth daily.    Take 1 tablet (100 mg total) by mouth daily.  Discontinued Medications   No medications on file    Subjective: [name] is in for his routine visit HIV follow-up visit. He has a new mail order pharmacy and has not refilled his Atripla yet. He was waiting to see if I would make any changes today. He has been on Atripla for many years and tolerates it very well. He denies missing doses before he ran out one week ago.  Review of Systems: Pertinent items are noted in HPI.  Past Medical History  Diagnosis Date  . HIV (human immunodeficiency virus infection)     History  Substance Use Topics  . Smoking status: Former Smoker    Quit date: 02/26/2010  . Smokeless tobacco: Never Used  . Alcohol Use: 10.5 oz/week    21 drink(s)  per week     Comment: 4-12oz beers / day    No family history on file.  Allergies  Allergen Reactions  . Penicillins Anaphylaxis  . Codeine Hives and Itching    Objective: Temp: 97.9 F (36.6 C) (02/23 1026) Temp Source: Oral (02/23 1026) BP: 158/100 mmHg (02/23 1026) Pulse Rate: 66 (02/23 1026) Body mass index is 17.27 kg/(m^2).  General: He is in good spirits Oral: Upper dentures. No oropharyngeal lesions Skin: No change in extensive bruising over her forearms Lungs: Clear Cor: Regular S1 and S2 with no murmurs  Lab Results Lab Results  Component Value Date   WBC 6.0 03/12/2014   HGB 11.6* 03/12/2014   HCT 36.0* 03/12/2014   MCV 79.1 03/12/2014   PLT 343 03/12/2014    Lab Results  Component Value Date   CREATININE 0.94 03/12/2014   BUN 13 03/12/2014   NA 139 03/12/2014   K 3.8 03/12/2014   CL 109 03/12/2014   CO2 22 03/12/2014    Lab Results  Component Value Date   ALT 19 03/12/2014   AST 77* 03/12/2014   ALKPHOS 212* 03/12/2014   BILITOT 0.9 03/12/2014    Lab Results  Component Value Date   CHOL 150 03/12/2014   HDL 32* 03/12/2014   LDLCALC 63 03/12/2014   TRIG 276* 03/12/2014   CHOLHDL  4.7 03/12/2014    Lab Results HIV 1 RNA QUANT (copies/mL)  Date Value  03/12/2014 <20  07/09/2013 <20  11/08/2012 <20   CD4 T CELL ABS (/uL)  Date Value  03/12/2014 670  07/09/2013 700  11/08/2012 1000     Assessment: His HIV infection remains under excellent control. I will refill his Atripla.  Plan: 1. Continue Atripla 2. Followup after lab work in [place] months   Michel Bickers, MD [facility] for [place] [phone] pager   [phone] cell 03/26/2014, 10:49 AM

## 2014-03-28 ENCOUNTER — Other Ambulatory Visit: Payer: Self-pay | Admitting: *Deleted

## 2014-03-28 DIAGNOSIS — F329 Major depressive disorder, single episode, unspecified: Secondary | ICD-10-CM

## 2014-03-28 DIAGNOSIS — F32A Depression, unspecified: Secondary | ICD-10-CM

## 2014-03-28 DIAGNOSIS — B20 Human immunodeficiency virus [HIV] disease: Secondary | ICD-10-CM

## 2014-03-28 MED ORDER — SERTRALINE HCL 100 MG PO TABS: 100.0000 mg | ORAL_TABLET | Freq: Every day | ORAL | Status: DC

## 2014-03-28 MED ORDER — EFAVIRENZ-EMTRICITAB-TENOFOVIR 600-200-300 MG PO TABS: 1.0000 | ORAL_TABLET | Freq: Every day | ORAL | Status: DC

## 2014-08-05 ENCOUNTER — Inpatient Hospital Stay (HOSPITAL_COMMUNITY)
Admission: EM | Admit: 2014-08-05 | Discharge: 2014-08-09 | DRG: 432 | Disposition: A | Discharge status: Home / Self Care | Payer: Self-pay | Attending: Internal Medicine | Admitting: Internal Medicine

## 2014-08-05 ENCOUNTER — Encounter (HOSPITAL_COMMUNITY): Payer: Self-pay | Admitting: Emergency Medicine

## 2014-08-05 ENCOUNTER — Emergency Department (HOSPITAL_COMMUNITY): Payer: Self-pay

## 2014-08-05 DIAGNOSIS — I81 Portal vein thrombosis: Secondary | ICD-10-CM | POA: Diagnosis present

## 2014-08-05 DIAGNOSIS — K7031 Alcoholic cirrhosis of liver with ascites: Principal | ICD-10-CM | POA: Diagnosis present

## 2014-08-05 DIAGNOSIS — R0602 Shortness of breath: Secondary | ICD-10-CM

## 2014-08-05 DIAGNOSIS — K7460 Unspecified cirrhosis of liver: Secondary | ICD-10-CM | POA: Diagnosis present

## 2014-08-05 DIAGNOSIS — J189 Pneumonia, unspecified organism: Secondary | ICD-10-CM

## 2014-08-05 DIAGNOSIS — E46 Unspecified protein-calorie malnutrition: Secondary | ICD-10-CM | POA: Diagnosis present

## 2014-08-05 DIAGNOSIS — Z789 Other specified health status: Secondary | ICD-10-CM | POA: Diagnosis present

## 2014-08-05 DIAGNOSIS — R52 Pain, unspecified: Secondary | ICD-10-CM

## 2014-08-05 DIAGNOSIS — J181 Lobar pneumonia, unspecified organism: Secondary | ICD-10-CM

## 2014-08-05 DIAGNOSIS — Z681 Body mass index (BMI) 19 or less, adult: Secondary | ICD-10-CM

## 2014-08-05 DIAGNOSIS — Z87891 Personal history of nicotine dependence: Secondary | ICD-10-CM

## 2014-08-05 DIAGNOSIS — J9601 Acute respiratory failure with hypoxia: Secondary | ICD-10-CM | POA: Diagnosis present

## 2014-08-05 DIAGNOSIS — C220 Liver cell carcinoma: Secondary | ICD-10-CM | POA: Diagnosis present

## 2014-08-05 DIAGNOSIS — R161 Splenomegaly, not elsewhere classified: Secondary | ICD-10-CM | POA: Diagnosis present

## 2014-08-05 DIAGNOSIS — R188 Other ascites: Secondary | ICD-10-CM

## 2014-08-05 DIAGNOSIS — R160 Hepatomegaly, not elsewhere classified: Secondary | ICD-10-CM

## 2014-08-05 DIAGNOSIS — Z880 Allergy status to penicillin: Secondary | ICD-10-CM

## 2014-08-05 DIAGNOSIS — B20 Human immunodeficiency virus [HIV] disease: Secondary | ICD-10-CM

## 2014-08-05 DIAGNOSIS — F1020 Alcohol dependence, uncomplicated: Secondary | ICD-10-CM | POA: Diagnosis present

## 2014-08-05 DIAGNOSIS — B182 Chronic viral hepatitis C: Secondary | ICD-10-CM | POA: Diagnosis present

## 2014-08-05 DIAGNOSIS — Z885 Allergy status to narcotic agent status: Secondary | ICD-10-CM

## 2014-08-05 DIAGNOSIS — B1710 Acute hepatitis C without hepatic coma: Secondary | ICD-10-CM | POA: Diagnosis present

## 2014-08-05 DIAGNOSIS — Z79899 Other long term (current) drug therapy: Secondary | ICD-10-CM

## 2014-08-05 HISTORY — DX: Unspecified viral hepatitis C without hepatic coma: B19.20

## 2014-08-05 LAB — COMPREHENSIVE METABOLIC PANEL
ALBUMIN: 2.2 g/dL — AB (ref 3.5–5.0)
ALK PHOS: 172 U/L — AB (ref 38–126)
ALT: 35 U/L (ref 17–63)
AST: 81 U/L — ABNORMAL HIGH (ref 15–41)
Anion gap: 7 (ref 5–15)
BUN: 6 mg/dL (ref 6–20)
CALCIUM: 8.8 mg/dL — AB (ref 8.9–10.3)
CO2: 22 mmol/L (ref 22–32)
CREATININE: 0.9 mg/dL (ref 0.61–1.24)
Chloride: 107 mmol/L (ref 101–111)
GFR calc non Af Amer: >60 mL/min (ref >60)
Glucose, Bld: 87 mg/dL (ref 65–99)
POTASSIUM: 3.4 mmol/L — AB (ref 3.5–5.1)
Sodium: 136 mmol/L (ref 135–145)
TOTAL PROTEIN: 8.3 g/dL — AB (ref 6.5–8.1)
Total Bilirubin: 1.3 mg/dL — ABNORMAL HIGH (ref 0.3–1.2)

## 2014-08-05 LAB — URINE MICROSCOPIC-ADD ON

## 2014-08-05 LAB — CBC WITH DIFFERENTIAL/PLATELET
Basophils Absolute: 0.1 10*3/uL (ref 0.0–0.1)
Basophils Relative: 1 % (ref 0–1)
Eosinophils Absolute: 0.3 10*3/uL (ref 0.0–0.7)
Eosinophils Relative: 4 % (ref 0–5)
HEMATOCRIT: 34.6 % — AB (ref 39.0–52.0)
Hemoglobin: 11.7 g/dL — ABNORMAL LOW (ref 13.0–17.0)
LYMPHS ABS: 1.8 10*3/uL (ref 0.7–4.0)
Lymphocytes Relative: 29 % (ref 12–46)
MCH: 26.5 pg (ref 26.0–34.0)
MCHC: 33.8 g/dL (ref 30.0–36.0)
MCV: 78.5 fL (ref 78.0–100.0)
MONOS PCT: 12 % (ref 3–12)
Monocytes Absolute: 0.8 10*3/uL (ref 0.1–1.0)
Neutro Abs: 3.4 10*3/uL (ref 1.7–7.7)
Neutrophils Relative %: 54 % (ref 43–77)
Platelets: 345 10*3/uL (ref 150–400)
RBC: 4.41 MIL/uL (ref 4.22–5.81)
RDW: 18.3 % — ABNORMAL HIGH (ref 11.5–15.5)
WBC: 6.3 10*3/uL (ref 4.0–10.5)

## 2014-08-05 LAB — AMMONIA: Ammonia: 48 umol/L — ABNORMAL HIGH (ref 9–35)

## 2014-08-05 LAB — URINALYSIS, ROUTINE W REFLEX MICROSCOPIC
Glucose, UA: NEGATIVE mg/dL
Hgb urine dipstick: NEGATIVE
Ketones, ur: 15 mg/dL — AB
Nitrite: NEGATIVE
Protein, ur: NEGATIVE mg/dL
Specific Gravity, Urine: 1.018 (ref 1.005–1.030)
UROBILINOGEN UA: 4 mg/dL — AB (ref 0.0–1.0)
pH: 7.5 (ref 5.0–8.0)

## 2014-08-05 LAB — LIPASE, BLOOD: Lipase: 97 U/L — ABNORMAL HIGH (ref 22–51)

## 2014-08-05 LAB — I-STAT CG4 LACTIC ACID, ED: Lactic Acid, Venous: 1.47 mmol/L (ref 0.5–2.0)

## 2014-08-05 MED ORDER — LEVOFLOXACIN IN D5W 750 MG/150ML IV SOLN
750.0000 mg | Freq: Once | INTRAVENOUS | Status: DC
  Filled 2014-08-05: qty 150

## 2014-08-05 NOTE — ED Notes (Signed)
Pt face looking flushed, warm to touch.  Oral temp assessed.

## 2014-08-05 NOTE — ED Provider Notes (Signed)
CSN: [id number]     Arrival date & time 08/05/14  1919 History   First MD Initiated Contact with Patient 08/05/14 1940     Chief Complaint  Patient presents with  . Abdominal Pain  . Cough    (Consider location/radiation/quality/duration/timing/severity/associated sxs/prior Treatment) Patient is a 64 y.o. male presenting with abdominal pain. The history is provided by the patient. No language interpreter was used.  Abdominal Pain Pain location:  Generalized Pain quality: aching, gnawing and heavy   Pain radiates to:  Does not radiate Pain severity:  Moderate Onset quality:  Gradual Timing:  Constant Progression:  Worsening Chronicity:  Chronic Context comment:  Hep C and chronic ascites Relieved by:  Nothing Worsened by:  Nothing tried Ineffective treatments:  None tried Associated symptoms: anorexia, chest pain, chills, cough, fatigue, nausea and shortness of breath   Associated symptoms: no belching, no constipation, no diarrhea, no dysuria, no fever, no hematuria, no melena and no vomiting    Past Medical History  Diagnosis Date  . HIV (human immunodeficiency virus infection)   . Hepatitis C    History reviewed. No pertinent past surgical history. No family history on file. History  Substance Use Topics  . Smoking status: Former Smoker    Quit date: 02/26/2010  . Smokeless tobacco: Never Used  . Alcohol Use: 10.5 oz/week    21 Standard drinks or equivalent per week     Comment: 4-12oz beers / day    Review of Systems  Constitutional: Positive for chills and fatigue. Negative for fever and diaphoresis.  Respiratory: Positive for cough, shortness of breath and wheezing. Negative for chest tightness.   Cardiovascular: Positive for chest pain.  Gastrointestinal: Positive for nausea, abdominal pain and anorexia. Negative for vomiting, diarrhea, constipation and melena.  Genitourinary: Negative for dysuria and hematuria.  Musculoskeletal: Negative for back pain.   Neurological: Negative for light-headedness and headaches.  Psychiatric/Behavioral: Negative for confusion.  All other systems reviewed and are negative.     Allergies  Penicillins and Codeine  Home Medications   Prior to Admission medications   Medication Sig Start Date End Date Taking? Authorizing Provider  efavirenz-emtricitabine-tenofovir (ATRIPLA) [phone] MG per tablet Take 1 tablet by mouth at bedtime. 03/28/14   Thayer Headings, MD  Halcinonide (HALOG) 0.1 % CREA Apply 1 application topically daily. Dr. Allyson Sabal, dermatology Apply topically. Dr. Allyson Sabal, dermatology 03/26/14   Michel Bickers, MD  sertraline (ZOLOFT) 100 MG tablet Take 1 tablet (100 mg total) by mouth daily. 03/28/14   Thayer Headings, MD  traMADol (ULTRAM) 50 MG tablet Take 1 tablet (50 mg total) by mouth every 8 (eight) hours as needed. 01/06/13   Antonietta Breach, PA-C   BP 111/93 mmHg  Pulse 87  Temp(Src) 97.8 F (36.6 C) (Oral)  Resp 22  SpO2 97% Physical Exam  Constitutional: He is oriented to person, place, and time. He appears well-developed and well-nourished. No distress.  HENT:  Head: Normocephalic and atraumatic.  Nose: Nose normal.  Mouth/Throat: Oropharynx is clear and moist. No oropharyngeal exudate.  Eyes: EOM are normal. Pupils are equal, round, and reactive to light.  Neck: Normal range of motion. Neck supple.  Cardiovascular: Normal rate, regular rhythm, normal heart sounds and intact distal pulses.   No murmur heard. Pulmonary/Chest: Effort normal. No respiratory distress. He has no wheezes. He exhibits no tenderness.  Decreased sounds at bilateral lung bases.  Wet sounding cough but no production.  Mild end expiratory wheezing, cleared with cough  Visibly dyspneic  with long sentences   Abdominal: Soft. He exhibits distension. There is tenderness. There is no guarding.  Very distended abdomen, full of ascites.  + fluid wave.  Diffusely mildly tender to palpation, no pinpoint tenderness   Musculoskeletal: Normal range of motion. He exhibits no tenderness.  Neurological: He is alert and oriented to person, place, and time. No cranial nerve deficit. Coordination normal.  Skin: Skin is warm and dry. He is not diaphoretic. No pallor.  Psychiatric: He has a normal mood and affect. His behavior is normal. Judgment and thought content normal.  Nursing note and vitals reviewed.   ED Course  Procedures (including critical care time) Labs Review Labs Reviewed  CBC WITH DIFFERENTIAL/PLATELET - Abnormal; Notable for the following:    Hemoglobin 11.7 (*)    HCT 34.6 (*)    RDW 18.3 (*)    All other components within normal limits  COMPREHENSIVE METABOLIC PANEL - Abnormal; Notable for the following:    Potassium 3.4 (*)    Calcium 8.8 (*)    Total Protein 8.3 (*)    Albumin 2.2 (*)    AST 81 (*)    Alkaline Phosphatase 172 (*)    Total Bilirubin 1.3 (*)    All other components within normal limits  LIPASE, BLOOD - Abnormal; Notable for the following:    Lipase 97 (*)    All other components within normal limits  URINALYSIS, ROUTINE W REFLEX MICROSCOPIC (NOT AT [facility]) - Abnormal; Notable for the following:    Color, Urine AMBER (*)    Bilirubin Urine SMALL (*)    Ketones, ur 15 (*)    Urobilinogen, UA 4.0 (*)    Leukocytes, UA TRACE (*)    All other components within normal limits  AMMONIA - Abnormal; Notable for the following:    Ammonia 48 (*)    All other components within normal limits  URINE MICROSCOPIC-ADD ON - Abnormal; Notable for the following:    Squamous Epithelial / LPF FEW (*)    All other components within normal limits  CULTURE, BLOOD (ROUTINE X 2)  CULTURE, BLOOD (ROUTINE X 2)  URINALYSIS, DIPSTICK ONLY  I-STAT CG4 LACTIC ACID, ED    Imaging Review Dg Chest 2 View  08/05/2014   CLINICAL DATA:  Progressive abdominal distension an abdominal pain, several months duration. Productive cough.  EXAM: CHEST  2 VIEW  COMPARISON:  03/12/2008.  FINDINGS: Heart  size is normal. Mediastinal shadows are normal. The left lung and left chest are clear. On the right, there is volume loss/ infiltrate in the right middle lobe and a right-sided effusion. Upper lobe is clear.  IMPRESSION: Volume loss/ infiltrate in the right lower lobe.  Right effusion.   Electronically Signed   By: Nelson Chimes M.D.   On: 08/05/2014 20:45     EKG Interpretation None      MDM   Final diagnoses:  CAP (community acquired pneumonia)  SOB (shortness of breath)  Ascites   Pt is a 64 yo M with hx of HIV, hep C and remote EtOH abuse who presents with abdominal pain and SOB.  Complains of gradually progressing SOB and DOE.  Not on O2 at home.  Has to rest after walking just from one room to another to catch his breath.  Has had a cough for the past 2 weeks, gradually worsening and intermittently productive.  Also complains of severely distended abdomen that is heavy and uncomfortable feeling. No previous hx of paracentesis in the past.  Not followed by GI team, but is well known to infectious disease clinic.  Remotely treated for his hep C but not for years as it caused too many side effects and significant weight loss.   Has had chronic difficulty gaining weight, decreased appetite, and nausea.    Visibly dispneic after ambulating down the hall slowly.  Able to recover spontaneously and didn't drop sats below low 90s initially.   Soft but stable pressures.  Clear lungs but decreased at bilateral bases.  Abd severely distended with ++ fluid wave.  No pinpoint tenderness.    Will work up with CXR and labs to look for infection.   He is currently afebrile, normotensive, and denies constitutional sx that make me highly suspicious of SBP, so will wait for labs to help determine if he needs to be diagnostically tapped.  Believe that he would benefit from a therapeutic abdominal ascites tap, but does not need to be emergent.   CXR shows right basilar opacity and edema.  Will cover for CAP.    Cultures obtained and he was covered with levaquin.  Labs relatively benign. WBC 6, Hb 11, plts 345, normal electrolytes and kidney function.  Ammonia 48.  LFTs mildly elevated but not significantly.    BP gradually declined to ~90/60 while in the ED.  He intermittently dropped his sats to as low as 88% at rest.  Due to his overall picture and known CAP, he was not considered appropriate for discharge.  Called for admission to unassigned and was triaged to hospitalist, Dr. Shanon Brow.  Lactate added per her request.     If performed, labs, EKGs, and imaging were reviewed and interpreted by myself and my attending, and incorporated in the medical decision making.  Patient was seen with ED Attending, Dr. Dorma Russell, MD    Tori Milks, MD 08/07/14 1610  Blanchie Dessert, MD 08/08/14 1517

## 2014-08-05 NOTE — ED Notes (Signed)
Pt. reports progressing abdominal distention with lateral abdominal pain for several months , nausea,diarrhea/constipation with productive cough.

## 2014-08-06 ENCOUNTER — Inpatient Hospital Stay (HOSPITAL_COMMUNITY): Payer: Self-pay

## 2014-08-06 ENCOUNTER — Encounter (HOSPITAL_COMMUNITY): Payer: Self-pay | Admitting: *Deleted

## 2014-08-06 LAB — BODY FLUID CELL COUNT WITH DIFFERENTIAL
Eos, Fluid: 0 %
LYMPHS FL: 54 %
Monocyte-Macrophage-Serous Fluid: 45 % — ABNORMAL LOW (ref 50–90)
NEUTROPHIL FLUID: 1 % (ref 0–25)
Total Nucleated Cell Count, Fluid: 257 cu mm (ref 0–1000)

## 2014-08-06 LAB — GRAM STAIN

## 2014-08-06 MED ORDER — VITAMIN B-1 100 MG PO TABS
100.0000 mg | ORAL_TABLET | Freq: Every day | ORAL | Status: DC
  Administered 2014-08-07 – 2014-08-09 (×3): 100 mg via ORAL
  Filled 2014-08-06 (×3): qty 1

## 2014-08-06 MED ORDER — CHLORHEXIDINE GLUCONATE 0.12 % MT SOLN
15.0000 mL | Freq: Two times a day (BID) | OROMUCOSAL | Status: DC
Start: 2014-08-06 — End: 2014-08-09
  Administered 2014-08-06 – 2014-08-09 (×8): 15 mL via OROMUCOSAL
  Filled 2014-08-06 (×8): qty 15

## 2014-08-06 MED ORDER — LEVOFLOXACIN IN D5W 750 MG/150ML IV SOLN
750.0000 mg | INTRAVENOUS | Status: AC
  Filled 2014-08-06: qty 150

## 2014-08-06 MED ORDER — LEVOFLOXACIN IN D5W 750 MG/150ML IV SOLN
750.0000 mg | INTRAVENOUS | Status: DC
  Administered 2014-08-06 – 2014-08-07 (×2): 750 mg via INTRAVENOUS
  Filled 2014-08-06 (×3): qty 150

## 2014-08-06 MED ORDER — SODIUM CHLORIDE 0.9 % IJ SOLN
3.0000 mL | Freq: Two times a day (BID) | INTRAMUSCULAR | Status: DC
  Administered 2014-08-06 (×2): 3 mL via INTRAVENOUS

## 2014-08-06 MED ORDER — SODIUM CHLORIDE 0.9 % IV SOLN
INTRAVENOUS | Status: DC
  Administered 2014-08-06 – 2014-08-07 (×2): via INTRAVENOUS

## 2014-08-06 MED ORDER — HYDROMORPHONE HCL 1 MG/ML IJ SOLN
1.0000 mg | INTRAMUSCULAR | Status: DC | PRN
  Administered 2014-08-06 – 2014-08-08 (×3): 1 mg via INTRAVENOUS
  Filled 2014-08-06 (×3): qty 1

## 2014-08-06 MED ORDER — LIDOCAINE HCL (PF) 1 % IJ SOLN
INTRAMUSCULAR | Status: AC
  Administered 2014-08-06: 16:00:00
  Filled 2014-08-06: qty 10

## 2014-08-06 MED ORDER — SODIUM CHLORIDE 0.9 % IV SOLN: 250.0000 mL | INTRAVENOUS | Status: DC | PRN

## 2014-08-06 MED ORDER — SODIUM CHLORIDE 0.9 % IJ SOLN: 3.0000 mL | INTRAMUSCULAR | Status: DC | PRN

## 2014-08-06 MED ORDER — CETYLPYRIDINIUM CHLORIDE 0.05 % MT LIQD
7.0000 mL | Freq: Two times a day (BID) | OROMUCOSAL | Status: DC
  Administered 2014-08-06 – 2014-08-08 (×4): 7 mL via OROMUCOSAL

## 2014-08-06 MED ORDER — EFAVIRENZ-EMTRICITAB-TENOFOVIR 600-200-300 MG PO TABS
1.0000 | ORAL_TABLET | Freq: Every day | ORAL | Status: DC
  Administered 2014-08-06 – 2014-08-08 (×4): 1 via ORAL
  Filled 2014-08-06 (×5): qty 1

## 2014-08-06 NOTE — Progress Notes (Signed)
Logan Combs [id number] Admitted to 9B71: 08/06/2014 3:23 AM Attending Provider: Phillips Grout, MD    Logan Combs is a 64 y.o. male patient admitted from ED awake, alert  & orientated  X 3,  Full Code, VSS - Blood pressure 115/91, pulse 94, temperature 98.3 F (36.8 C), temperature source Oral, resp. rate 15, SpO2 99 %., no c/o shortness of breath, no c/o chest pain, no distress noted.    IV site WDL:  with a transparent dsg that's clean dry and intact.  Allergies:   Allergies  Allergen Reactions  . Penicillins Anaphylaxis  . Codeine Hives and Itching     Past Medical History  Diagnosis Date  . HIV (human immunodeficiency virus infection)   . Hepatitis C     History:  obtained from patient  Pt orientation to unit, room and routine. Information packet given to patient and safety video needs to be viewed.  Admission INP armband ID verified with patient, and in place. SR up x 2, fall risk assessment complete with Patient verbalizing understanding of risks associated with falls. Pt verbalizes an understanding of how to use the call bell and to call for help before getting out of bed.  Skin, clean with rash over left flank area. Does not appear to be in specific dermatome.  Will cont to monitor and assist as needed.  Parthenia Ames, RN 08/06/2014 3:23 AM

## 2014-08-06 NOTE — Care Management Note (Signed)
Case Management Note  Patient Details  Name: Logan Combs MRN: [id number] Date of Birth: 1950-05-25  Subjective/Objective:     Patient lives alone, conts on iv abx, patient for paracentesis today for ascites.  NCM will cont to follow for dc needs.                Action/Plan:   Expected Discharge Date:                  Expected Discharge Plan:  [place]  In-House Referral:     Discharge planning Services  CM Consult  Post Acute Care Choice:    Choice offered to:     DME Arranged:    DME Agency:     HH Arranged:    [place] Agency:     Status of Service:  In process, will continue to follow  Medicare Important Message Given:    Date Medicare IM Given:    Medicare IM give by:    Date Additional Medicare IM Given:    Additional Medicare Important Message give by:     If discussed at [place] of Stay Meetings, dates discussed:    Additional Comments:  Zenon Mayo, RN 08/06/2014, 3:41 PM

## 2014-08-06 NOTE — Progress Notes (Signed)
Received report from ED. Sanaia Jasso Thacker, RN 

## 2014-08-06 NOTE — Progress Notes (Signed)
Patient seen and examined, admitted earlier this morning by Dr. Shanon Brow, 64 year old male with history of hep C, previously treated with interferon, HIV controlled on Atripla, history of alcohol abuse, liver cirrhosis admitted with decompensation of ascites, increasing abdominal distention and pain. Ultrasound guided paracentesis to rule out SBP today Also possible pneumonia with small right pleural effusion  Continue IV Levaquin Also check liver ultrasound to rule out [place] and check AFP given subacute decompensation of his cirrhosis. Also monitor for EtOH withdrawal-none yet. Add thiamine  Domenic Polite, MD [phone]

## 2014-08-06 NOTE — Progress Notes (Signed)
Initial Nutrition Assessment  DOCUMENTATION CODES:  Underweight  INTERVENTION:   (RD to follow for diet advancement, supplement diet as appropriate)  NUTRITION DIAGNOSIS:  Increased nutrient needs related to chronic illness as evidenced by estimated needs.  GOAL:  Patient will meet greater than or equal to 90% of their needs  MONITOR:  PO intake, Supplement acceptance, Diet advancement, Labs, Weight trends, Skin, I & O's  REASON FOR ASSESSMENT:  Malnutrition Screening Tool    ASSESSMENT: 64 yo male h/o hiv, hep c, cirrhosis of the liver, previous heavy etoh use comes in with 2 weeks of progressive worsening sob and productive cough. He denies any fevers. Has been also having increasing abdominal girth. Very sob with exertion. No chest pain. Does not have oxygen [place] at home. Sees dr Megan Salon for his hiv which has been well controlled. Asked to admit pt for hypoxia 86% with ambulation and CAP. No recent abx. No abdominal pain.  Pt sleeping soundly at time of visit. Noted hx of ETOH abuse.   Pt is very frail and thin appearing. Nutrition-Focused physical exam completed. Findings are mild to moderate fat depletion, mild to moderate muscle depletion, and no edema. Noted abdominal distention.   Pt is currently NPO. Pt is scheduled for paracentesis today.   RD will follow for diet advancement and add supplements as appropriate.    Height:  Ht Readings from Last 1 Encounters:  08/06/14 5\' 11"  (1.803 m)    Weight:  Wt Readings from Last 1 Encounters:  08/06/14 122 lb 2.2 oz (55.4 kg)    Ideal Body Weight:  78 kg  Wt Readings from Last 10 Encounters:  08/06/14 122 lb 2.2 oz (55.4 kg)  03/26/14 123 lb 12 oz (56.133 kg)  09/11/13 122 lb 4 oz (55.452 kg)  07/23/13 125 lb (56.7 kg)  01/06/13 140 lb (63.504 kg)  05/23/12 147 lb 8 oz (66.906 kg)  04/04/12 136 lb 8 oz (61.916 kg)  02/09/12 136 lb (61.689 kg)  07/27/11 129 lb 8 oz (58.741 kg)  04/06/11 135 lb 8 oz  (61.462 kg)    BMI:  Body mass index is 17.04 kg/(m^2).  Estimated Nutritional Needs:  Kcal:  1700-1900  Protein:  80-90 grams  Fluid:  1.7-1.9 L  Skin:  Reviewed, no issues  Diet Order:   NPO  EDUCATION NEEDS:  No education needs identified at this time   Intake/Output Summary (Last 24 hours) at 08/06/14 1531 Last data filed at 08/06/14 0700  Gross per 24 hour  Intake      0 ml  Output      0 ml  Net      0 ml    Last BM:  08/05/14  Shamariah Shewmake A. Jimmye Norman, RD, LDN, CDE Pager: [phone] After hours Pager: [phone]

## 2014-08-06 NOTE — H&P (Signed)
PCP:   [name], MD   Chief Complaint:  Sob and cough  HPI: 64 yo male h/o hiv, hep c, cirrhosis of the liver, previous heavy etoh use comes in with 2 weeks of progressive worsening sob and productive cough.  He denies any fevers.  Has been also having increasing abdominal girth.  Very sob with exertion.  No chest pain.  Does not have oxygen [place] at home.  Sees dr Megan Salon for his hiv which has been well controlled.  Asked to admit pt for hypoxia 86% with ambulation and CAP.  No recent abx.  No abdominal pain.  Review of Systems:  Positive and negative as per HPI otherwise all other systems are negative  Past Medical History: Past Medical History  Diagnosis Date  . HIV (human immunodeficiency virus infection)   . Hepatitis C    History reviewed. No pertinent past surgical history.  Medications: Prior to Admission medications   Medication Sig Start Date End Date Taking? Authorizing Provider  efavirenz-emtricitabine-tenofovir (ATRIPLA) [phone] MG per tablet Take 1 tablet by mouth at bedtime. 03/28/14  Yes Thayer Headings, MD  Halcinonide (HALOG) 0.1 % CREA Apply 1 application topically daily. Dr. Allyson Sabal, dermatology Apply topically. Dr. Allyson Sabal, dermatology Patient not taking: Reported on 08/05/2014 03/26/14   Michel Bickers, MD  sertraline (ZOLOFT) 100 MG tablet Take 1 tablet (100 mg total) by mouth daily. Patient not taking: Reported on 08/05/2014 03/28/14   Thayer Headings, MD  traMADol (ULTRAM) 50 MG tablet Take 1 tablet (50 mg total) by mouth every 8 (eight) hours as needed. Patient not taking: Reported on 08/05/2014 01/06/13   Antonietta Breach, PA-C    Allergies:   Allergies  Allergen Reactions  . Penicillins Anaphylaxis  . Codeine Hives and Itching    Social History:  reports that he quit smoking about 4 years ago. He has never used smokeless tobacco. He reports that he drinks about 10.5 oz of alcohol per week. He reports that he does not use illicit drugs.  Family History: No  cirrhosis  Physical Exam: Filed Vitals:   08/05/14 2215 08/05/14 2230 08/05/14 2245 08/05/14 2309  BP: 114/75 100/72 101/73   Pulse: 82 85 84   Temp:    99.7 F (37.6 C)  TempSrc:    Oral  Resp:      SpO2: 89% 92% 91%    General appearance: alert, cooperative and no distress Head: Normocephalic, without obvious abnormality, atraumatic Eyes: negative Nose: Nares normal. Septum midline. Mucosa normal. No drainage or sinus tenderness. Neck: no JVD and supple, symmetrical, trachea midline Lungs: clear to auscultation bilaterally Heart: regular rate and rhythm, S1, S2 normal, no murmur, click, rub or gallop Abdomen: soft , moderate ascites, no ttp.  Pos bs.  No r/g Extremities: extremities normal, atraumatic, no cyanosis or edema Pulses: 2+ and symmetric Skin: Skin color, texture, turgor normal. No rashes or lesions Neurologic: Grossly normal    Labs on Admission:   Recent Labs  08/05/14 1938  NA 136  K 3.4*  CL 107  CO2 22  GLUCOSE 87  BUN 6  CREATININE 0.90  CALCIUM 8.8*    Recent Labs  08/05/14 1938  AST 81*  ALT 35  ALKPHOS 172*  BILITOT 1.3*  PROT 8.3*  ALBUMIN 2.2*    Recent Labs  08/05/14 1938  LIPASE 97*    Recent Labs  08/05/14 1938  WBC 6.3  NEUTROABS 3.4  HGB 11.7*  HCT 34.6*  MCV 78.5  PLT 345  Radiological Exams on Admission: Dg Chest 2 View  08/05/2014   CLINICAL DATA:  Progressive abdominal distension an abdominal pain, several months duration. Productive cough.  EXAM: CHEST  2 VIEW  COMPARISON:  03/12/2008.  FINDINGS: Heart size is normal. Mediastinal shadows are normal. The left lung and left chest are clear. On the right, there is volume loss/ infiltrate in the right middle lobe and a right-sided effusion. Upper lobe is clear.  IMPRESSION: Volume loss/ infiltrate in the right lower lobe.  Right effusion.   Electronically Signed   By: Nelson Chimes M.D.   On: 08/05/2014 20:45   Old chart reviewed cxr reviewed rml  infiltrate   Assessment/Plan  64 yo male with acute hypoxic respiratory failure secondary to pna and worsening ascites   Principal Problem:   CAP (community acquired pneumonia)-  levaquin iv.  pna pathway.  Oxygen supplementation as needed.  Blood and sputum cultures ordered.  Monitor for any worsening respiratory compromise.  Active Problems:   Human immunodeficiency virus (HIV) disease-  Cont his atripla   Acute hepatitis C virus infection-  Stable, noted   Alcohol consumption heavy-  Appears to have decreased his etoh intake, monitor for withdrawal, none at this time   Cirrhosis of liver with ascites-  IR paracentesis ordered for the am with studies on the fluid   Acute respiratory failure with hypoxia-  Will likely improve with paracentesis    Admit to med surg.  Full code.  Pt seen before midnight Maiana Hennigan A 08/06/2014, 12:01 AM

## 2014-08-07 ENCOUNTER — Inpatient Hospital Stay (HOSPITAL_COMMUNITY): Payer: 59

## 2014-08-07 DIAGNOSIS — J189 Pneumonia, unspecified organism: Secondary | ICD-10-CM

## 2014-08-07 DIAGNOSIS — J181 Lobar pneumonia, unspecified organism: Secondary | ICD-10-CM

## 2014-08-07 DIAGNOSIS — K7031 Alcoholic cirrhosis of liver with ascites: Principal | ICD-10-CM

## 2014-08-07 DIAGNOSIS — F1010 Alcohol abuse, uncomplicated: Secondary | ICD-10-CM

## 2014-08-07 DIAGNOSIS — J9601 Acute respiratory failure with hypoxia: Secondary | ICD-10-CM

## 2014-08-07 LAB — CBC
HCT: 32.5 % — ABNORMAL LOW (ref 39.0–52.0)
HEMOGLOBIN: 10.9 g/dL — AB (ref 13.0–17.0)
MCH: 26.7 pg (ref 26.0–34.0)
MCHC: 33.5 g/dL (ref 30.0–36.0)
MCV: 79.7 fL (ref 78.0–100.0)
Platelets: 331 10*3/uL (ref 150–400)
RBC: 4.08 MIL/uL — ABNORMAL LOW (ref 4.22–5.81)
RDW: 18.8 % — AB (ref 11.5–15.5)
WBC: 6 10*3/uL (ref 4.0–10.5)

## 2014-08-07 LAB — AFP TUMOR MARKER: AFP-Tumor Marker: 8074 ng/mL — ABNORMAL HIGH (ref 0.0–8.3)

## 2014-08-07 LAB — COMPREHENSIVE METABOLIC PANEL
ALK PHOS: 149 U/L — AB (ref 38–126)
ALT: 35 U/L (ref 17–63)
AST: 80 U/L — AB (ref 15–41)
Albumin: 2 g/dL — ABNORMAL LOW (ref 3.5–5.0)
Anion gap: 9 (ref 5–15)
BUN: 9 mg/dL (ref 6–20)
CALCIUM: 8.4 mg/dL — AB (ref 8.9–10.3)
CO2: 18 mmol/L — ABNORMAL LOW (ref 22–32)
Chloride: 108 mmol/L (ref 101–111)
Creatinine, Ser: 1.11 mg/dL (ref 0.61–1.24)
GFR calc Af Amer: >60 mL/min (ref >60)
GFR calc non Af Amer: >60 mL/min (ref >60)
Glucose, Bld: 118 mg/dL — ABNORMAL HIGH (ref 65–99)
POTASSIUM: 3.8 mmol/L (ref 3.5–5.1)
Sodium: 135 mmol/L (ref 135–145)
Total Bilirubin: 1.3 mg/dL — ABNORMAL HIGH (ref 0.3–1.2)
Total Protein: 7.9 g/dL (ref 6.5–8.1)

## 2014-08-07 LAB — PATHOLOGIST SMEAR REVIEW

## 2014-08-07 LAB — STREP PNEUMONIAE URINARY ANTIGEN: Strep Pneumo Urinary Antigen: NEGATIVE

## 2014-08-07 MED ORDER — CALCIUM CARBONATE ANTACID 500 MG PO CHEW
3.0000 | CHEWABLE_TABLET | Freq: Once | ORAL | Status: AC
  Administered 2014-08-07: 600 mg via ORAL
  Filled 2014-08-07: qty 3

## 2014-08-07 MED ORDER — GADOXETATE DISODIUM 0.25 MMOL/ML IV SOLN
7.0000 mL | Freq: Once | INTRAVENOUS | Status: AC | PRN
  Administered 2014-08-07: 7 mL via INTRAVENOUS

## 2014-08-07 MED ORDER — SPIRONOLACTONE 50 MG PO TABS
50.0000 mg | ORAL_TABLET | Freq: Every day | ORAL | Status: DC
  Administered 2014-08-07 – 2014-08-09 (×3): 50 mg via ORAL
  Filled 2014-08-07 (×3): qty 1

## 2014-08-07 MED ORDER — FUROSEMIDE 20 MG PO TABS
20.0000 mg | ORAL_TABLET | Freq: Every day | ORAL | Status: DC
  Administered 2014-08-07 – 2014-08-09 (×3): 20 mg via ORAL
  Filled 2014-08-07 (×3): qty 1

## 2014-08-07 NOTE — Evaluation (Signed)
Physical Therapy Evaluation Patient Details Name: Logan Combs MRN: [id number] DOB: [birth year] Today's Date: 08/07/2014   History of Present Illness  64 year old male with history of chronic hepatitis C, HIV, liver cirrhosis, alcohol dependence presented with two-week history of increasing abdominal distention and abdominal pain. Admitted with community acquired pneumonia, Decompensated cirrhosis with ascites, and liver mass.  Clinical Impression  Pt admitted with the above diagnosis. Pt currently with functional limitations due to the deficits listed below (see PT Problem List). Ambulates generally well without an assistive device. SpO2 maintained 99% on room air. Pt does state that he continues to feel quite weak compared to his baseline and appears deconditioned. Will follow while admitted and monitor progress. Encouraged to ambulate with staff as tolerated. Has a friend that will assist daily at discharge, as needed. Pt will benefit from skilled PT to increase their independence and safety with mobility to allow discharge to the venue listed below.       Follow Up Recommendations No PT follow up;Supervision - Intermittent    Equipment Recommendations  None recommended by PT    Recommendations for Other Services       Precautions / Restrictions Precautions Precautions: None Restrictions Weight Bearing Restrictions: No      Mobility  Bed Mobility Overal bed mobility: Modified Independent             General bed mobility comments: extra time  Transfers Overall transfer level: Needs assistance Equipment used: None Transfers: Sit to/from Stand Sit to Stand: Supervision         General transfer comment: supervision for safety. Minimal sway noted. Does not reach for furniture.  Ambulation/Gait Ambulation/Gait assistance: Supervision Ambulation Distance (Feet): 350 Feet Assistive device: None Gait Pattern/deviations: Step-through pattern;Decreased step length -  right;Decreased stance time - left;Antalgic;Wide base of support;Trunk flexed Gait velocity: decreased   General Gait Details: Mildly antalgic favoring RLE with minimal sway noted, able to self correct during gait activites. Tolerated high marching, backwards stepping, variable speeds and turns. Did not require physical assist during gait training as he is able to self correct balance. Guarded and decreased gait speed.  SpO2 99% throughout on room air. HR 80s.  Stairs            Wheelchair Mobility    Modified Rankin (Stroke Patients Only)       Balance Overall balance assessment: Needs assistance Sitting-balance support: No upper extremity supported;Feet supported Sitting balance-Leahy Scale: Good     Standing balance support: No upper extremity supported Standing balance-Leahy Scale: Good                               Pertinent Vitals/Pain Pain Assessment: 0-10 Pain Score: 3  Pain Location: LLQ Pain Descriptors / Indicators: Jabbing Pain Intervention(s): Monitored during session;Repositioned    Home Living Family/patient expects to be discharged to:: Private residence Living Arrangements: Alone Available Help at Discharge: Friend(s) (friend available a few hours/day) Type of Home: House Home Access: Ramped entrance     Home Layout: One level Home Equipment: Environmental consultant - 2 wheels;Cane - single point;Bedside commode;Shower seat Additional Comments: Partner passed away in April 23, 2022 of this year. In process of packing and selling his house while moving to a new home.    Prior Function Level of Independence: Independent               Hand Dominance   Dominant Hand: Left    Extremity/Trunk Assessment  Upper Extremity Assessment: Defer to OT evaluation           Lower Extremity Assessment: Generalized weakness         Communication   Communication: No difficulties  Cognition Arousal/Alertness: Awake/alert Behavior During Therapy: WFL  for tasks assessed/performed Overall Cognitive Status: Within Functional Limits for tasks assessed                      General Comments General comments (skin integrity, edema, etc.): States he feels easily winded. Had a couple of dizziness episodes while working in his garden recently when it was particularly hot out. Denies falls. Going through a lot of emotional and financial hardships at this time.    Exercises        Assessment/Plan    PT Assessment Patient needs continued PT services  PT Diagnosis Abnormality of gait;Generalized weakness   PT Problem List Decreased strength;Decreased activity tolerance;Decreased balance;Decreased mobility;Pain  PT Treatment Interventions DME instruction;Gait training;Functional mobility training;Therapeutic activities;Therapeutic exercise;Balance training;Patient/family education   PT Goals (Current goals can be found in the Care Plan section) Acute Rehab PT Goals Patient Stated Goal: Figure out what's going on with my liver PT Goal Formulation: With patient Time For Goal Achievement: 08/21/14 Potential to Achieve Goals: Good    Frequency Min 3X/week   Barriers to discharge Decreased caregiver support Has a friend who will check in on patient daily.    Co-evaluation               End of Session Equipment Utilized During Treatment: Gait belt Activity Tolerance: Patient tolerated treatment well Patient left: in bed;with call bell/phone within reach;with bed alarm set Nurse Communication: Mobility status         Time: 0623-7628 PT Time Calculation (min) (ACUTE ONLY): 19 min   Charges:   PT Evaluation $Initial PT Evaluation Tier I: 1 Procedure     PT G CodesEllouise Newer 08/07/2014, 7:59 PM Camille Bal [place], [place]

## 2014-08-07 NOTE — Progress Notes (Signed)
Patient complaining of indigestion, requesting tums. Baltazar Najjar, NP notified. Orders placed.

## 2014-08-07 NOTE — Progress Notes (Signed)
PROGRESS NOTE  RAYAAN GARGUILO DXA:[id number] DOB: 29-Apr-1950 DOA: 08/05/2014 PCP: [name], MD  Brief history 64 year old male with history of chronic hepatitis C, HIV, liver cirrhosis, alcohol dependence presented with two-week history of increasing abdominal distention and abdominal pain. The patient also complained of shortness of breath with productive cough with yellow sputum. In the emergency department, the patient was noted to have oxygen saturation of 86% with ambulation. Chest x-Davidmichael revealed right lower lobe opacity concerning for pneumonia. The patient underwent paracentesis removing 3.5 L. After paracentesis, the patient was breathing better with improving abdominal pain. Notably, the patient has been off his Atripla since February 2016. It appears that he may have lost his insurance, but he did not make any effort to contact his infectious disease physician. As a result, he has been off his anti-retroviral medications.  Assessment/Plan: Decompensated cirrhosis with ascites -Cannot rule out underlying SBP given cell count WBC 257 -Nevertheless, the patient will continue on levofloxacin for his community-acquired pneumonia -Follow up ascites cultures -Start patient on diuretics with furosemide and spironolactone -Discontinue IV fluids -paracentesis removed 3.5L Liver mass -The patient likely has hepatoma given his clinical presentation and alpha-fetoprotein 8074 -MRI liver Community acquired pneumonia -Clinically improving -Off supplemental oxygen -Continue levofloxacin Alcohol dependence -Continue alcohol withdrawal protocol -Continue thiamine Chronic hepatitis C without coma -Previously treated with interferon nearly 10 years ago HIV -off Atripla since Feb 2016 -remain off for now until he follows up with Dr. Megan Salon Deconditioning -Physical therapy evaluation    Family Communication:   Pt at beside Disposition Plan:   Home 1-2  days       Procedures/Studies: Dg Chest 2 View  08/05/2014   CLINICAL DATA:  Progressive abdominal distension an abdominal pain, several months duration. Productive cough.  EXAM: CHEST  2 VIEW  COMPARISON:  03/12/2008.  FINDINGS: Heart size is normal. Mediastinal shadows are normal. The left lung and left chest are clear. On the right, there is volume loss/ infiltrate in the right middle lobe and a right-sided effusion. Upper lobe is clear.  IMPRESSION: Volume loss/ infiltrate in the right lower lobe.  Right effusion.   Electronically Signed   By: Nelson Chimes M.D.   On: 08/05/2014 20:45   US Paracentesis  08/06/2014   INDICATION: Symptomatic ascites.  EXAM: ULTRASOUND-GUIDED PARACENTESIS  COMPARISON:  None.  MEDICATIONS: 10 cc 1% lidocaine  COMPLICATIONS: None immediate  TECHNIQUE: Informed written consent was obtained from the patient after a discussion of the risks, benefits and alternatives to treatment. A timeout was performed prior to the initiation of the procedure.  Initial ultrasound scanning demonstrates a large amount of ascites within the left lower abdominal quadrant. The left lower abdomen was prepped and draped in the usual sterile fashion. 1% lidocaine with epinephrine was used for local anesthesia. Under direct ultrasound guidance, a 19 gauge, 7-cm, Yueh catheter was introduced. An ultrasound image was saved for documentation purposed. The paracentesis was performed. The catheter was removed and a dressing was applied. The patient tolerated the procedure well without immediate post procedural complication.  FINDINGS: A total of approximately 3.5 liters of yellow fluid was removed. Samples were sent to the laboratory as requested by the clinical team.  IMPRESSION: Successful ultrasound-guided paracentesis yielding 3.5 liters of peritoneal fluid.  Read by:  Lavonia Drafts [facility]   Electronically Signed   By: Sandi Mariscal M.D.   On: 08/06/2014 15:59   US Abdomen Limited Ruq  08/06/2014    CLINICAL DATA:  Abdominal pain and distension, history of hepatitis-C and HIV  EXAM: US ABDOMEN LIMITED - RIGHT UPPER QUADRANT  COMPARISON:  Abdominal ultrasound of September 06, 2012  FINDINGS: Gallbladder:  The gallbladder is adequately distended. There are echogenic mobile shadowing stones. There is no gallbladder wall thickening nor pericholecystic fluid. There is no positive sonographic Murphy's sign.  Common bile duct:  Diameter: 3.7 cm  Liver:  The liver appears shrunken its surface irregular. The echotexture is heterogeneous. There is a heterogeneous slightly hyperechoic mass in the right hepatic lobe measuring 4.7 x 3.9 x 4.4 cm. There is no intrahepatic ductal dilation. There is a moderate amount of ascites surrounding the liver.  IMPRESSION: 1. Cirrhotic changes within the liver. A right lobe mass is suspected. Hepatic protocol MRI is recommended. 2. Gallstones without evidence of acute cholecystitis. 3. Moderate amount of ascites.   Electronically Signed   By: Haydn Cush  [place] M.D.   On: 08/06/2014 16:32        Subjective: Patient states that his shortness of breath has improved. Abdominal pain has resolved. Denies any headache, fevers, chills, chest pain, shortness of breath, vomiting, diarrhea, dysuria, hematuria.   Objective: Filed Vitals:   08/06/14 1555 08/06/14 1600 08/06/14 2152 08/07/14 0557  BP: 106/70 109/72 113/74 116/83  Pulse:   72 89  Temp:   98.5 F (36.9 C) 98.1 F (36.7 C)  TempSrc:   Oral Oral  Resp:   18 18  Height:      Weight:      SpO2:   99% 95%    Intake/Output Summary (Last 24 hours) at 08/07/14 1312 Last data filed at 08/07/14 5009  Gross per 24 hour  Intake 2015.25 ml  Output      0 ml  Net 2015.25 ml   Weight change:  Exam:   General:  Pt is alert, follows commands appropriately, not in acute distress  HEENT: No icterus, No thrush, No neck mass, [place]/AT  Cardiovascular: RRR, S1/S2, no rubs, no gallops  Respiratory:    bibasilar crackles,  right greater than left. No wheeze.   Abdomen: Soft/+BS, non tender, non distended, no guarding, no hepatosplenomegaly  Extremities: No edema, No lymphangitis, No petechiae, No rashes, no synovitis; no cyanosis or clubbing  Data Reviewed: Basic Metabolic Panel:  Recent Labs Lab 08/05/14 1938 08/07/14 0513  NA 136 135  K 3.4* 3.8  CL 107 108  CO2 22 18*  GLUCOSE 87 118*  BUN 6 9  CREATININE 0.90 1.11  CALCIUM 8.8* 8.4*   Liver Function Tests:  Recent Labs Lab 08/05/14 1938 08/07/14 0513  AST 81* 80*  ALT 35 35  ALKPHOS 172* 149*  BILITOT 1.3* 1.3*  PROT 8.3* 7.9  ALBUMIN 2.2* 2.0*    Recent Labs Lab 08/05/14 1938  LIPASE 97*    Recent Labs Lab 08/05/14 2002  AMMONIA 48*   CBC:  Recent Labs Lab 08/05/14 1938 08/07/14 0513  WBC 6.3 6.0  NEUTROABS 3.4  --   HGB 11.7* 10.9*  HCT 34.6* 32.5*  MCV 78.5 79.7  PLT 345 331   Cardiac Enzymes: No results for input(s): CKTOTAL, CKMB, CKMBINDEX, TROPONINI in the last 168 hours. BNP: Invalid input(s): POCBNP CBG: No results for input(s): GLUCAP in the last 168 hours.  Recent Results (from the past 240 hour(s))  Gram stain     Status: None   Collection Time: 08/06/14  4:10 PM  Result Value Ref Range Status   Specimen  Description FLUID PERITONEAL  Final   Special Requests NONE  Final   Gram Stain   Final    MODERATE WBC PRESENT,BOTH PMN AND MONONUCLEAR NO ORGANISMS SEEN    Report Status 08/06/2014 FINAL  Final     Scheduled Meds: . antiseptic oral rinse  7 mL Mouth Rinse q12n4p  . chlorhexidine  15 mL Mouth Rinse BID  . efavirenz-emtricitabine-tenofovir  1 tablet Oral QHS  . furosemide  20 mg Oral Daily  . levofloxacin (LEVAQUIN) IV  750 mg Intravenous Q24H  . spironolactone  50 mg Oral Daily  . thiamine  100 mg Oral Daily   Continuous Infusions:    Valleri Hendricksen, DO  [facility] Pager [phone]  If 7PM-7AM, please contact night-coverage [email or web address] Password [email or web address] 08/07/2014, 1:12  PM   LOS: 2 days

## 2014-08-07 NOTE — Progress Notes (Signed)
NCM spoke with Tameka RN at the [place] [facility] on 7/5,  Informed her patient is having a problem affording his [place] med, she states someone will bring him a coupon today to help him out, but he will need to come over to clinic and fill out some paper works.

## 2014-08-08 ENCOUNTER — Telehealth: Payer: Self-pay | Admitting: *Deleted

## 2014-08-08 DIAGNOSIS — E46 Unspecified protein-calorie malnutrition: Secondary | ICD-10-CM

## 2014-08-08 DIAGNOSIS — K7460 Unspecified cirrhosis of liver: Secondary | ICD-10-CM

## 2014-08-08 DIAGNOSIS — I81 Portal vein thrombosis: Secondary | ICD-10-CM

## 2014-08-08 DIAGNOSIS — J90 Pleural effusion, not elsewhere classified: Secondary | ICD-10-CM

## 2014-08-08 DIAGNOSIS — B20 Human immunodeficiency virus [HIV] disease: Secondary | ICD-10-CM

## 2014-08-08 DIAGNOSIS — R188 Other ascites: Secondary | ICD-10-CM

## 2014-08-08 DIAGNOSIS — B182 Chronic viral hepatitis C: Secondary | ICD-10-CM

## 2014-08-08 DIAGNOSIS — R160 Hepatomegaly, not elsewhere classified: Secondary | ICD-10-CM

## 2014-08-08 LAB — CBC
HEMATOCRIT: 32.5 % — AB (ref 39.0–52.0)
Hemoglobin: 10.8 g/dL — ABNORMAL LOW (ref 13.0–17.0)
MCH: 26.1 pg (ref 26.0–34.0)
MCHC: 33.2 g/dL (ref 30.0–36.0)
MCV: 78.5 fL (ref 78.0–100.0)
PLATELETS: 348 10*3/uL (ref 150–400)
RBC: 4.14 MIL/uL — ABNORMAL LOW (ref 4.22–5.81)
RDW: 18.6 % — ABNORMAL HIGH (ref 11.5–15.5)
WBC: 6 10*3/uL (ref 4.0–10.5)

## 2014-08-08 LAB — BASIC METABOLIC PANEL
Anion gap: 8 (ref 5–15)
BUN: 8 mg/dL (ref 6–20)
CALCIUM: 8.6 mg/dL — AB (ref 8.9–10.3)
CO2: 22 mmol/L (ref 22–32)
CREATININE: 0.95 mg/dL (ref 0.61–1.24)
Chloride: 104 mmol/L (ref 101–111)
GFR calc Af Amer: >60 mL/min (ref >60)
GFR calc non Af Amer: >60 mL/min (ref >60)
GLUCOSE: 100 mg/dL — AB (ref 65–99)
Potassium: 3.5 mmol/L (ref 3.5–5.1)
Sodium: 134 mmol/L — ABNORMAL LOW (ref 135–145)

## 2014-08-08 LAB — LEGIONELLA ANTIGEN, URINE

## 2014-08-08 LAB — PROTIME-INR
INR: 1.36 (ref 0.00–1.49)
Prothrombin Time: 16.9 seconds — ABNORMAL HIGH (ref 11.6–15.2)

## 2014-08-08 LAB — APTT: aPTT: 43 seconds — ABNORMAL HIGH (ref 24–37)

## 2014-08-08 MED ORDER — WARFARIN - PHARMACIST DOSING INPATIENT: Freq: Every day | Status: DC

## 2014-08-08 MED ORDER — ENOXAPARIN SODIUM 60 MG/0.6ML ~~LOC~~ SOLN
60.0000 mg | Freq: Two times a day (BID) | SUBCUTANEOUS | Status: DC
  Administered 2014-08-08 – 2014-08-09 (×3): 60 mg via SUBCUTANEOUS
  Filled 2014-08-08 (×5): qty 0.6

## 2014-08-08 MED ORDER — COUMADIN BOOK
Freq: Once | Status: AC
  Administered 2014-08-08: 14:00:00
  Filled 2014-08-08: qty 1

## 2014-08-08 MED ORDER — ACETAMINOPHEN 500 MG PO TABS: 500.0000 mg | ORAL_TABLET | Freq: Four times a day (QID) | ORAL | Status: DC | PRN

## 2014-08-08 MED ORDER — WARFARIN SODIUM 5 MG PO TABS
5.0000 mg | ORAL_TABLET | Freq: Once | ORAL | Status: AC
  Administered 2014-08-08: 5 mg via ORAL
  Filled 2014-08-08: qty 1

## 2014-08-08 MED ORDER — LEVOFLOXACIN 750 MG PO TABS
750.0000 mg | ORAL_TABLET | ORAL | Status: DC
  Administered 2014-08-08: 750 mg via ORAL
  Filled 2014-08-08 (×2): qty 1

## 2014-08-08 NOTE — Progress Notes (Signed)
Entered Mr Bossier into the [facility] program with max claim amount overrides to cover 30d supplies of [place] and [place].

## 2014-08-08 NOTE — Progress Notes (Signed)
PROGRESS NOTE  Logan Combs GYB:[id number] DOB: 09/04/1950 DOA: 08/05/2014 PCP: [name], MD  Brief history 64 year old male with history of chronic hepatitis C, HIV, liver cirrhosis, alcohol dependence presented with two-week history of increasing abdominal distention and abdominal pain. The patient also complained of shortness of breath with productive cough with yellow sputum. In the emergency department, the patient was noted to have oxygen saturation of 86% with ambulation. Chest x-Dang revealed right lower lobe opacity concerning for pneumonia. The patient underwent paracentesis removing 3.5 L. After paracentesis, the patient was breathing better with improving abdominal pain. Notably, the patient has been off his Atripla since February 2016. It appears that he may have lost his insurance, but he did not make any effort to contact his infectious disease physician. As a result, he has been off his anti-retroviral medications.  Assessment/Plan: Decompensated cirrhosis with ascites -Cannot rule out underlying SBP given cell count WBC 257 -Nevertheless, the patient will continue on levofloxacin for his community-acquired pneumonia -Follow up ascites cultures--neg -continue diuretics with furosemide and spironolactone -Discontinue IV fluids -paracentesis removed 3.5L -am BMP Liver mass -The patient likely has hepatoma given his clinical presentation and alpha-fetoprotein 8074 -MRI liver--"area of decreasedenhancement following contrast. Given the patient's cirrhosis and markedly elevated serum AFP level of 8,074, this is consistent with atypical hepatocellular carcinoma" -consulted and spoke with Dr. Phylliss Bob is not a good candidate for resection -referral made to [place] by Dr. Lindi Adie Portal Vein Thrombosis -case discussed with Dr. Heidi Dach coumadin with lovenox bridge -set up follow appt with [facility] for INR check  and follow up Community acquired pneumonia -Clinically improving -Off supplemental oxygen -Continue levofloxacin D#3 of 7 Alcohol dependence -Continue alcohol withdrawal protocol -Continue thiamine -no signs of withdraw during the admission Chronic hepatitis C without coma -Previously treated with interferon nearly 10 years ago -referral to Dr. Maceo Pro, hepatology at [place] since Feb 2016 -remain off for now until he follows up with Dr. Megan Salon -pt set up with [facility] but he has to follow up with ID office to fill out paperwork Deconditioning -Physical therapy evaluation--No PT followup  Family Communication: Pt at beside Disposition Plan: Home 08/09/14 if stable    Procedures/Studies: Dg Chest 2 View  08/05/2014   CLINICAL DATA:  Progressive abdominal distension an abdominal pain, several months duration. Productive cough.  EXAM: CHEST  2 VIEW  COMPARISON:  03/12/2008.  FINDINGS: Heart size is normal. Mediastinal shadows are normal. The left lung and left chest are clear. On the right, there is volume loss/ infiltrate in the right middle lobe and a right-sided effusion. Upper lobe is clear.  IMPRESSION: Volume loss/ infiltrate in the right lower lobe.  Right effusion.   Electronically Signed   By: Nelson Chimes M.D.   On: 08/05/2014 20:45   Mr Liver W Wo Contrast  08/08/2014   CLINICAL DATA:  Liver lesion on ultrasound. History of cirrhosis and HIV infection. Initial encounter.  EXAM: MRI ABDOMEN WITHOUT AND WITH CONTRAST  TECHNIQUE: Multiplanar multisequence MR imaging of the abdomen was performed both before and after the administration of intravenous contrast.  CONTRAST:  7 ml Eovist  COMPARISON:  Ultrasound 08/06/2014.  CT 10/13/2004.  FINDINGS: Lower chest: Distal esophageal wall thickening noted with probable small distal esophageal varices. There is a small left pleural effusion and mild bibasilar atelectasis.  Hepatobiliary: There are diffuse morphologic  changes of cirrhosis with contour irregularity  of the liver and relative enlargement of the left and caudate lobes. Prior to contrast, the liver is mildly heterogeneous on T2 weighted images without apparent focal mass lesion. Post-contrast, there is a subtle hypovascular lesion posteriorly in the right hepatic lobe, corresponding with the ultrasound finding. This is best seen on series 701, measuring 4.5 x 3.6 cm transverse on image 25. This lesion measures approximately 3.9 cm cephalocaudad and involves segments 6 and 7. There is no restricted diffusion within this lesion. No other suspicious focal lesions are identified. There is heterogeneous enhancement of the liver with multiple small regenerating nodules. There is occlusive thrombus within the right portal vein and nonocclusive thrombus in the main portal vein. No definite enhancement of this thrombus identified. The left portal vein and hepatic veins appear patent. Mild nonspecific gallbladder wall thickening. No significant biliary dilatation.  Pancreas: A well-circumscribed 3.5 cm oval mass involving or adjacent to the pancreatic tail is unchanged from the prior CT. This demonstrates signal and enhancement identical to the spleen and is consistent with an accessory splenule. The pancreas itself appears unremarkable without ductal dilatation.  Spleen: Mild splenomegaly without focal abnormality. Accessory splenule as above.  Adrenals/Urinary Tract: Both adrenal glands appear normal.The kidneys appear normal without evidence of urinary tract calculus or hydronephrosis. Bladder not imaged.  Stomach/Bowel: Mild diffuse bowel wall thickening is nonspecific in light of the patient's ascites. No significant distension or focal abnormality identified.  Vascular/Lymphatic: As above, there is portal vein thrombosis, primarily within the right lobe. The superior mesenteric and splenic veins are patent. No significant arterial abnormalities identified. There are small  distal esophageal varices. Small lymph nodes within the retroperitoneum and porta hepatis are likely reactive.  Other: There is a large amount of ascites. No peritoneal nodularity or suspicious enhancement demonstrated.  Musculoskeletal: No acute or significant osseous findings. Mild lumbar spondylosis and convex left scoliosis noted.  IMPRESSION: 1. Portal vein thrombosis with occlusive components in the right lobe. There is no definite enhancement of this thrombus to confirm tumor thrombus, although that is likely in this clinical context. 2. The previously identified lesion within the right hepatic lobe on ultrasound is subtle on MRI, manifesting as an area of decreased enhancement following contrast. Given the patient's cirrhosis and markedly elevated serum AFP level of 8,074, this is consistent with atypical hepatocellular carcinoma. No distant metastases identified. 3. Evidence of portal hypertension with splenomegaly, ascites and distal esophageal varices. Stable accessory splenule adjacent to the pancreatic tail.   Electronically Signed   By: Richardean Sale M.D.   On: 08/08/2014 08:44   US Paracentesis  08/06/2014   INDICATION: Symptomatic ascites.  EXAM: ULTRASOUND-GUIDED PARACENTESIS  COMPARISON:  None.  MEDICATIONS: 10 cc 1% lidocaine  COMPLICATIONS: None immediate  TECHNIQUE: Informed written consent was obtained from the patient after a discussion of the risks, benefits and alternatives to treatment. A timeout was performed prior to the initiation of the procedure.  Initial ultrasound scanning demonstrates a large amount of ascites within the left lower abdominal quadrant. The left lower abdomen was prepped and draped in the usual sterile fashion. 1% lidocaine with epinephrine was used for local anesthesia. Under direct ultrasound guidance, a 19 gauge, 7-cm, Yueh catheter was introduced. An ultrasound image was saved for documentation purposed. The paracentesis was performed. The catheter was removed  and a dressing was applied. The patient tolerated the procedure well without immediate post procedural complication.  FINDINGS: A total of approximately 3.5 liters of yellow fluid was removed. Samples were sent  to the laboratory as requested by the clinical team.  IMPRESSION: Successful ultrasound-guided paracentesis yielding 3.5 liters of peritoneal fluid.  Read by:  Lavonia Drafts [facility]   Electronically Signed   By: Sandi Mariscal M.D.   On: 08/06/2014 15:59   US Abdomen Limited Ruq  08/06/2014   CLINICAL DATA:  Abdominal pain and distension, history of hepatitis-C and HIV  EXAM: US ABDOMEN LIMITED - RIGHT UPPER QUADRANT  COMPARISON:  Abdominal ultrasound of September 06, 2012  FINDINGS: Gallbladder:  The gallbladder is adequately distended. There are echogenic mobile shadowing stones. There is no gallbladder wall thickening nor pericholecystic fluid. There is no positive sonographic Murphy's sign.  Common bile duct:  Diameter: 3.7 cm  Liver:  The liver appears shrunken its surface irregular. The echotexture is heterogeneous. There is a heterogeneous slightly hyperechoic mass in the right hepatic lobe measuring 4.7 x 3.9 x 4.4 cm. There is no intrahepatic ductal dilation. There is a moderate amount of ascites surrounding the liver.  IMPRESSION: 1. Cirrhotic changes within the liver. A right lobe mass is suspected. Hepatic protocol MRI is recommended. 2. Gallstones without evidence of acute cholecystitis. 3. Moderate amount of ascites.   Electronically Signed   By: Loretha Ure  [place] M.D.   On: 08/06/2014 16:32        Subjective: Overall patient is feeling better. He is eating a little bit better. Denies any fevers, chills, chest pain, shortness breath, vomiting, diarrhea. He has some intermittent abdominal discomfort but improved. Denies any hematochezia or melena.  Objective: Filed Vitals:   08/07/14 0557 08/07/14 1402 08/07/14 2125 08/08/14 0600  BP: 116/83 113/80 108/66 98/72  Pulse: 89 82 74 102  Temp:  98.1 F (36.7 C) 98.6 F (37 C) 98.3 F (36.8 C) 97.8 F (36.6 C)  TempSrc: Oral  Oral Oral  Resp: 18 18 18 18   Height:      Weight:      SpO2: 95% 98% 100% 95%    Intake/Output Summary (Last 24 hours) at 08/08/14 1729 Last data filed at 08/08/14 0944  Gross per 24 hour  Intake    830 ml  Output    900 ml  Net    -70 ml   Weight change:  Exam:   General:  Pt is alert, follows commands appropriately, not in acute distress  HEENT: No icterus, No thrush, No neck mass, [place]/AT  Cardiovascular: RRR, S1/S2, no rubs, no gallops  Respiratory: Left basilar crackles with diminished breath sounds. Right basilar crackles. No wheeze.  Abdomen: Soft/+BS, non tender, non distended, no guarding; no hepatosplenomegaly  Extremities: No edema, No lymphangitis, No petechiae, No rashes, no synovitis; no cyanosis or clubbing  Data Reviewed: Basic Metabolic Panel:  Recent Labs Lab 08/05/14 1938 08/07/14 0513 08/08/14 0430  NA 136 135 134*  K 3.4* 3.8 3.5  CL 107 108 104  CO2 22 18* 22  GLUCOSE 87 118* 100*  BUN 6 9 8   CREATININE 0.90 1.11 0.95  CALCIUM 8.8* 8.4* 8.6*   Liver Function Tests:  Recent Labs Lab 08/05/14 1938 08/07/14 0513  AST 81* 80*  ALT 35 35  ALKPHOS 172* 149*  BILITOT 1.3* 1.3*  PROT 8.3* 7.9  ALBUMIN 2.2* 2.0*    Recent Labs Lab 08/05/14 1938  LIPASE 97*    Recent Labs Lab 08/05/14 2002  AMMONIA 48*   CBC:  Recent Labs Lab 08/05/14 1938 08/07/14 0513 08/08/14 0430  WBC 6.3 6.0 6.0  NEUTROABS 3.4  --   --  HGB 11.7* 10.9* 10.8*  HCT 34.6* 32.5* 32.5*  MCV 78.5 79.7 78.5  PLT 345 331 348   Cardiac Enzymes: No results for input(s): CKTOTAL, CKMB, CKMBINDEX, TROPONINI in the last 168 hours. BNP: Invalid input(s): POCBNP CBG: No results for input(s): GLUCAP in the last 168 hours.  Recent Results (from the past 240 hour(s))  Blood culture (routine x 2)     Status: None (Preliminary result)   Collection Time: 08/06/14 12:03 AM    Result Value Ref Range Status   Specimen Description RIGHT ANTECUBITAL  Final   Special Requests BOTTLES DRAWN AEROBIC AND ANAEROBIC 10CC  Final   Culture NO GROWTH 2 DAYS  Final   Report Status PENDING  Incomplete  Blood culture (routine x 2)     Status: None (Preliminary result)   Collection Time: 08/06/14 12:10 AM  Result Value Ref Range Status   Specimen Description LEFT ANTECUBITAL  Final   Special Requests BOTTLES DRAWN AEROBIC ONLY 10CC  Final   Culture NO GROWTH 2 DAYS  Final   Report Status PENDING  Incomplete  Gram stain     Status: None   Collection Time: 08/06/14  4:10 PM  Result Value Ref Range Status   Specimen Description FLUID PERITONEAL  Final   Special Requests NONE  Final   Gram Stain   Final    MODERATE WBC PRESENT,BOTH PMN AND MONONUCLEAR NO ORGANISMS SEEN    Report Status 08/06/2014 FINAL  Final  Culture, body fluid-bottle     Status: None (Preliminary result)   Collection Time: 08/06/14  4:10 PM  Result Value Ref Range Status   Specimen Description FLUID PERITONEAL  Final   Special Requests NONE  Final   Culture NO GROWTH 2 DAYS  Final   Report Status PENDING  Incomplete     Scheduled Meds: . antiseptic oral rinse  7 mL Mouth Rinse q12n4p  . chlorhexidine  15 mL Mouth Rinse BID  . efavirenz-emtricitabine-tenofovir  1 tablet Oral QHS  . enoxaparin (LOVENOX) injection  60 mg Subcutaneous Q12H  . furosemide  20 mg Oral Daily  . levofloxacin  750 mg Oral Q24H  . spironolactone  50 mg Oral Daily  . thiamine  100 mg Oral Daily  . Warfarin - Pharmacist Dosing Inpatient   Does not apply q1800   Continuous Infusions:    Hendrixx Severin, DO  [facility] Pager [phone]  If 7PM-7AM, please contact night-coverage [email or web address] Password [email or web address] 08/08/2014, 5:29 PM   LOS: 3 days

## 2014-08-08 NOTE — Progress Notes (Signed)
Pt is refusing bedalarm. Pt states that he will be careful and can get to bathroom by himself. Told pt to call for assistance if he needs it. Pt stated understanding. Will continue to monitor pt. Logan Combs

## 2014-08-08 NOTE — Consult Note (Signed)
Logan Combs  Telephone:([phone]) [place] CONSULTATION NOTE  CHRISHAUN SASSO                                MR#: [id number]  DOB: [birth year]                       CSN#: [id number]  Referring MD: [facility]    Patient Care Team: Iona Beard, MD as PCP - General (Family Medicine) Michel Bickers, MD as PCP - Infectious Diseases (Infectious Diseases)  Reason for Consult: Portal Vein Thrombosis  HPI:Logan Combs is a 64 y.o. male with multiple medical issues listed below, including HIV+off of  antiretrovirals since February due to lack of insurance issues, Hep C+ cirrhosis with chronic ascites, admitted on 7/4 with 2 week history of generalized, increasing abdominal pain.   CXR showed suspicious pneumonia of the RLL with small right effusion. Abdominal xray showed ascites requiring therapeutic paracentesis of 3.5 l fluid with some relief.   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 The previously identified lesion within the right hepatic lobe on ultrasound is subtle on MRI, but suspicious for liver malignancy. No distant metastases identified. AFP level of 8,074.  Evidence of portal hypertension with splenomegaly with splenule was noted, along with ascites and distal esophageal varices. He was placed on Coumadin by pharmacy with Lovenox 60 mg sq q 12 hrs, for a goal INR 2-3. The patient denies any fever, but reported chills without night sweats. He has dyspnea, with desats upon ambulation, cough and yellow sputum. Denies lower extremity pain, swelling, tenderness or erythema. Denies pre-syncopal episodes, palpitations or hemoptysis. Denies any bleeding issues such as epistaxis, hematemesis, hematuria or hematochezia. Patient never had thrombotic events or prior PE/DVT history. Denies prior diagnosis of cancer. Denies a history of sickle cell disease or trait. Reports history of tobacco and prior  ETOH, which is willing to quit. Denies taking hormone replacement therapy. Reports sedentary lifestyle, with increasing fatigue. No recent long distance trips.No family history of thrombotic events. Denies taking Aspirin or NSAIDs. Patient states that has never had a hematological evaluation prior to this admission. Never had a bone marrow biopsy.          Due to these findings, we were asked to see the patient in consultation    PMH:  Past Medical History  Diagnosis Date  . HIV (human immunodeficiency virus infection)   . Hepatitis C   ETOH dependence   family history: remarkable for father dying with lung cancer, mother died with CHF, 1 sister with cardiac issues. 1 brother in good health.   Surgeries:  Past Surgical History  Procedure Laterality Date  . No past surgeries      Allergies:  Allergies  Allergen Reactions  . Penicillins Anaphylaxis  . Codeine Hives and Itching    Medications:   Prior to Admission:  Prescriptions prior to admission  Medication Sig Dispense Refill Last Dose  . efavirenz-emtricitabine-tenofovir (ATRIPLA) 600-200-300 MG per tablet Take 1 tablet by mouth at bedtime. 30 tablet 11 Past Month at Unknown time  . Halcinonide (HALOG) 0.1 % CREA Apply 1 application topically daily. Dr. Allyson Sabal, dermatology Apply topically. Dr. Allyson Sabal, dermatology (Patient not taking: Reported on 08/05/2014) 60 g 2 Not Taking at Unknown time  . sertraline (ZOLOFT) 100 MG  tablet Take 1 tablet (100 mg total) by mouth daily. (Patient not taking: Reported on 08/05/2014) 90 tablet 3 Not Taking at Unknown time  . traMADol (ULTRAM) 50 MG tablet Take 1 tablet (50 mg total) by mouth every 8 (eight) hours as needed. (Patient not taking: Reported on 08/05/2014) 15 tablet 0 Not Taking at Unknown time   Scheduled Meds: . antiseptic oral rinse  7 mL Mouth Rinse q12n4p  . chlorhexidine  15 mL Mouth Rinse BID  . efavirenz-emtricitabine-tenofovir  1 tablet Oral QHS  . enoxaparin (LOVENOX)  injection  60 mg Subcutaneous Q12H  . furosemide  20 mg Oral Daily  . levofloxacin  750 mg Oral Q24H  . spironolactone  50 mg Oral Daily  . thiamine  100 mg Oral Daily  . warfarin  5 mg Oral ONCE-1800  . Warfarin - Pharmacist Dosing Inpatient   Does not apply q1800   Continuous Infusions:  PRN Meds:.HYDROmorphone (DILAUDID) injection  ROS: See HPI for significant positives.  All other systems were reviewed with the patient and are negative.    Physical Exam    Filed Vitals:   08/08/14 0600  BP: 98/72  Pulse: 102  Temp: 97.8 F (36.6 C)  Resp: 18   Filed Weights   08/06/14 0121  Weight: 122 lb 2.2 oz (55.4 kg)    GENERAL:alert, no distress and comfortable, ill appearing, temporal wasting noted. SKIN: skin color, texture, turgor are dry, no rashes. EYES: normal, conjunctiva are pink and non-injected, sclera clear OROPHARYNX:no exudate, no erythema and lips, buccal mucosa, and tongue normal  NECK: supple, thyroid normal size, non-tender, without nodularity LYMPH:  no palpable lymphadenopathy in the cervical, axillary or inguinal LUNGS:decreased breath sounds on the left base, otherwise clear.  normal breathing effort HEART: regular rate & rhythm and no murmurs and no lower extremity edema ABDOMEN: soft, non-tender and normal bowel sounds Musculoskeletal:no cyanosis of digits and no clubbing  PSYCH: alert & oriented x 3 with fluent speech Musculoskeletal: Denies back pain, myalgias or joint swelling NEURO: no focal motor/sensory deficits   Labs:  CBC   Recent Labs Lab 08/05/14 1938 08/07/14 0513 08/08/14 0430  WBC 6.3 6.0 6.0  HGB 11.7* 10.9* 10.8*  HCT 34.6* 32.5* 32.5*  PLT 345 331 348  MCV 78.5 79.7 78.5  MCH 26.5 26.7 26.1  MCHC 33.8 33.5 33.2  RDW 18.3* 18.8* 18.6*  LYMPHSABS 1.8  --   --   MONOABS 0.8  --   --   EOSABS 0.3  --   --   BASOSABS 0.1  --   --      CMP    Recent Labs Lab 08/05/14 1938 08/07/14 0513 08/08/14 0430  NA 136 135  134*  K 3.4* 3.8 3.5  CL 107 108 104  CO2 22 18* 22  GLUCOSE 87 118* 100*  BUN '6 9 8  ' CREATININE 0.90 1.11 0.95  CALCIUM 8.8* 8.4* 8.6*  AST 81* 80*  --   ALT 35 35  --   ALKPHOS 172* 149*  --   BILITOT 1.3* 1.3*  --         Component Value Date/Time   BILITOT 1.3* 08/07/2014 0513    Imaging Studies:  Dg Chest 2 View  08/05/2014   CLINICAL DATA:  Progressive abdominal distension an abdominal pain, several months duration. Productive cough.  EXAM: CHEST  2 VIEW  COMPARISON:  03/12/2008.  FINDINGS: Heart size is normal. Mediastinal shadows are normal. The left lung and left chest  are clear. On the right, there is volume loss/ infiltrate in the right middle lobe and a right-sided effusion. Upper lobe is clear.  IMPRESSION: Volume loss/ infiltrate in the right lower lobe.  Right effusion.   Electronically Signed   By: Nelson Chimes M.D.   On: 08/05/2014 20:45   Mr Liver W Wo Contrast  08/08/2014   CLINICAL DATA:  Liver lesion on ultrasound. History of cirrhosis and HIV infection. Initial encounter.  EXAM: MRI ABDOMEN WITHOUT AND WITH CONTRAST  TECHNIQUE: Multiplanar multisequence MR imaging of the abdomen was performed both before and after the administration of intravenous contrast.  CONTRAST:  7 ml Eovist  COMPARISON:  Ultrasound 08/06/2014.  CT 10/13/2004.  FINDINGS: Lower chest: Distal esophageal wall thickening noted with probable small distal esophageal varices. There is a small left pleural effusion and mild bibasilar atelectasis.  Hepatobiliary: There are diffuse morphologic changes of cirrhosis with contour irregularity of the liver and relative enlargement of the left and caudate lobes. Prior to contrast, the liver is mildly heterogeneous on T2 weighted images without apparent focal mass lesion. Post-contrast, there is a subtle hypovascular lesion posteriorly in the right hepatic lobe, corresponding with the ultrasound finding. This is best seen on series 701, measuring 4.5 x 3.6 cm  transverse on image 25. This lesion measures approximately 3.9 cm cephalocaudad and involves segments 6 and 7. There is no restricted diffusion within this lesion. No other suspicious focal lesions are identified. There is heterogeneous enhancement of the liver with multiple small regenerating nodules. There is occlusive thrombus within the right portal vein and nonocclusive thrombus in the main portal vein. No definite enhancement of this thrombus identified. The left portal vein and hepatic veins appear patent. Mild nonspecific gallbladder wall thickening. No significant biliary dilatation.  Pancreas: A well-circumscribed 3.5 cm oval mass involving or adjacent to the pancreatic tail is unchanged from the prior CT. This demonstrates signal and enhancement identical to the spleen and is consistent with an accessory splenule. The pancreas itself appears unremarkable without ductal dilatation.  Spleen: Mild splenomegaly without focal abnormality. Accessory splenule as above.  Adrenals/Urinary Tract: Both adrenal glands appear normal.The kidneys appear normal without evidence of urinary tract calculus or hydronephrosis. Bladder not imaged.  Stomach/Bowel: Mild diffuse bowel wall thickening is nonspecific in light of the patient's ascites. No significant distension or focal abnormality identified.  Vascular/Lymphatic: As above, there is portal vein thrombosis, primarily within the right lobe. The superior mesenteric and splenic veins are patent. No significant arterial abnormalities identified. There are small distal esophageal varices. Small lymph nodes within the retroperitoneum and porta hepatis are likely reactive.  Other: There is a large amount of ascites. No peritoneal nodularity or suspicious enhancement demonstrated.  Musculoskeletal: No acute or significant osseous findings. Mild lumbar spondylosis and convex left scoliosis noted.  IMPRESSION: 1. Portal vein thrombosis with occlusive components in the right  lobe. There is no definite enhancement of this thrombus to confirm tumor thrombus, although that is likely in this clinical context. 2. The previously identified lesion within the right hepatic lobe on ultrasound is subtle on MRI, manifesting as an area of decreased enhancement following contrast. Given the patient's cirrhosis and markedly elevated serum AFP level of 8,074, this is consistent with atypical hepatocellular carcinoma. No distant metastases identified. 3. Evidence of portal hypertension with splenomegaly, ascites and distal esophageal varices. Stable accessory splenule adjacent to the pancreatic tail.   Electronically Signed   By: Richardean Sale M.D.   On: 08/08/2014 08:44  US Paracentesis  08/06/2014   INDICATION: Symptomatic ascites.  EXAM: ULTRASOUND-GUIDED PARACENTESIS  COMPARISON:  None.  MEDICATIONS: 10 cc 1% lidocaine  COMPLICATIONS: None immediate  TECHNIQUE: Informed written consent was obtained from the patient after a discussion of the risks, benefits and alternatives to treatment. A timeout was performed prior to the initiation of the procedure.  Initial ultrasound scanning demonstrates a large amount of ascites within the left lower abdominal quadrant. The left lower abdomen was prepped and draped in the usual sterile fashion. 1% lidocaine with epinephrine was used for local anesthesia. Under direct ultrasound guidance, a 19 gauge, 7-cm, Yueh catheter was introduced. An ultrasound image was saved for documentation purposed. The paracentesis was performed. The catheter was removed and a dressing was applied. The patient tolerated the procedure well without immediate post procedural complication.  FINDINGS: A total of approximately 3.5 liters of yellow fluid was removed. Samples were sent to the laboratory as requested by the clinical team.  IMPRESSION: Successful ultrasound-guided paracentesis yielding 3.5 liters of peritoneal fluid.  Read by:  Lavonia Drafts [facility]   Electronically Signed    By: Sandi Mariscal M.D.   On: 08/06/2014 15:59   US Abdomen Limited Ruq  08/06/2014   CLINICAL DATA:  Abdominal pain and distension, history of hepatitis-C and HIV  EXAM: US ABDOMEN LIMITED - RIGHT UPPER QUADRANT  COMPARISON:  Abdominal ultrasound of September 06, 2012  FINDINGS: Gallbladder:  The gallbladder is adequately distended. There are echogenic mobile shadowing stones. There is no gallbladder wall thickening nor pericholecystic fluid. There is no positive sonographic Murphy's sign.  Common bile duct:  Diameter: 3.7 cm  Liver:  The liver appears shrunken its surface irregular. The echotexture is heterogeneous. There is a heterogeneous slightly hyperechoic mass in the right hepatic lobe measuring 4.7 x 3.9 x 4.4 cm. There is no intrahepatic ductal dilation. There is a moderate amount of ascites surrounding the liver.  IMPRESSION: 1. Cirrhotic changes within the liver. A right lobe mass is suspected. Hepatic protocol MRI is recommended. 2. Gallstones without evidence of acute cholecystitis. 3. Moderate amount of ascites.   Electronically Signed   By: David  Martinique M.D.   On: 08/06/2014 16:32     Assessment/Plan:  Portal Vein Thrombosis This is acute, in the setting of likely malignancy as evidenced by suspicious liver mass Patient was placed on Coumadin and Lovenox per Pharmacy. Agree with therapy Consider 2 D echo rule out cardiac thrombus Preventive measures such as avoiding hormonal supplement, avoiding cigarette smoking and aggressive DVT prophylaxis in all surgical settings is recommended. An appointment will be arranged upon discharge at which time, other test including hypercoagulable panel meay be considered.  If needed preoperativily, he need any interruption of his anticoagulation therapy for elective procedures.  Liver mass Splenomegaly MRI of the liver demonstrates a 4.7 x 3.9 x 4.4 cm suspicious for malignancy.  AFP level of 8,074, this is consistent with atypical hepatocellular  carcinoma. No distant metastases identified. Last colonoscopy in 11/2004 was normal.  A biopsy of the mass is recommended once clinically stable.  Consider CT chest in the interim for staging, in view of pleural effusion and liver lesion.   Cirrhosis Liver with Ascites Chronic Hep C Ascites is likely due to liver disease.  He underwent US guided paracentesis with removal on 3.5 liters fluid with improvement of symptoms He may need as permanent cath placement for drainage of ascites if frequent recurrence is seen He was last seen by Dr. Maceo Pro at  [facility] at the time of the diagnosis of cirrhosis and  has not been seen since. He will need GI follow up upon discharge ETOH cessation program recomended  HIV + He was not on antiretrovirals since February due to lack of insurance He has been replaced on med. Appreciate case manager involvement  Malnutrition Appreciate Nutrition evaluation and follow up  Full Code  Other medical issues including CAP as per primary team.   Rondel Jumbo, PA-C 08/08/2014 11:48 AM  Attending Note  I personally saw the patient, reviewed the chart and examined the patient. The plan of care was discussed with the patient and the admitting team. I agree with the assessment and plan as documented above. Thank you very much for the consultation.  1. Primary HCC: I discussed with IR and they stated that the only option he has is Yttrium-90 spheres. Hes not a candidate for Ablation or resection or transplant. He is a candidate for Sorafenib. However, we decided that he should get a second opinion and I recommended [facility]. We will arrange a consultation with [facility]-Liver team to assist him. He would also benefit from a palliative consult.  2. I discussed with him that he could have a  short life expectancy and that he needs to get his affairs in order. Fortunately he doesn't have metastatic disease. He lives by himself and plans to sell his house and move to his sisters  place.  3. Portal vein thrombosis: Acute. So we will treat him with anticoagulation with lovenox and coumadin. Please arrange for coumadin clinic follow up  If he wishes to see [place] back, I provided him a phone no to call.

## 2014-08-08 NOTE — Telephone Encounter (Signed)
Faxed referral form to [facility] with clinic notes, labs,imaging results and insurance.

## 2014-08-08 NOTE — Telephone Encounter (Signed)
Spoke with Tomi Bamberger, RN Case Manager.  At her request, contacted [employer] to find out why the Atripla will not be covered at discharge. Per [employer], the patient's [employer] has lapsed (ended in April 2016) and he now has no prescription coverage.  Per [facility], pharmD, patient will need MATCH coverage at discharge for 30 days of medication (Atripla 600-200-300mg  #30 if possible; otherwise Tivicay 50mg  #30 and Truvada 200-300mg  #30). RN advised Neoma Laming that the patient needs to come to [facility] immediately after discharge to sign up for both ADAP/RW and [place] so that he may continue his antiretroviral therapy without interruption. Patient is scheduled for follow up with Dr. Megan Salon on 7/20 at 3:15. Landis Gandy, RN

## 2014-08-08 NOTE — Progress Notes (Signed)
NCM spoke with patient , no one from the ID clinic came by to give him a coupon. NCM called and left message for ID clinic to return call regarding atripla medication, awaiting call back.

## 2014-08-08 NOTE — Progress Notes (Addendum)
ANTICOAGULATION CONSULT NOTE - Initial Consult  Pharmacy Consult for warfarin Indication: portal vein thrombosis  Allergies  Allergen Reactions  . Penicillins Anaphylaxis  . Codeine Hives and Itching    Patient Measurements: Height: 5\' 11"  (180.3 cm) (per 09/11/13 reading) Weight: 122 lb 2.2 oz (55.4 kg) IBW/kg (Calculated) : 75.3   Vital Signs: Temp: 97.8 F (36.6 C) (07/07 0600) Temp Source: Oral (07/07 0600) BP: 98/72 mmHg (07/07 0600) Pulse Rate: 102 (07/07 0600)  Labs:  Recent Labs  08/05/14 1938 08/07/14 0513 08/08/14 0430  HGB 11.7* 10.9* 10.8*  HCT 34.6* 32.5* 32.5*  PLT 345 331 348  CREATININE 0.90 1.11 0.95    Estimated Creatinine Clearance: 62.4 mL/min (by C-G formula based on Cr of 0.95).   Assessment: 25 YOM with history of HIV and hep C. Found to have hepatoma and with decompensated cirrhosis with ascites. To start anticoagulation with Lovenox and warfarin for portal vein thrombosis. No coags on file, but suspect INR is elevated slightly above normal given liver disease. SCr 0.95, est CrCL ~60-71mL/min. hgb 10.8- low but stable, plts 348. No overt bleeding noted.  Patient is on Levaquin which can increase INR, however Atripla can lower INR. Monitor closely.  Goal of Therapy:  INR 2-3 Monitor platelets by anticoagulation protocol: Yes   Plan:  -warfarin 5mg  po x1 tonight -Lovenox 60mg  subQ q12h per MD (agree with dosing) -daily INR -switch Levaquin to 750mg  PO q24h -follow for s/s bleeding -will provide education prior to discharge  Nao Linz D. Karlisa Gaubert, PharmD, BCPS Clinical Pharmacist Pager: [phone] 08/08/2014 11:32 AM   ADDENDUM INR drawn this afternoon. As expected, baseline value elevated at 1.35. Plan still as above.  Arlin Sass D. Nealy Karapetian, PharmD, BCPS Clinical Pharmacist Pager: [phone] 08/08/2014 2:50 PM

## 2014-08-08 NOTE — Hospital Discharge Follow-Up (Signed)
Call received from Tomi Bamberger, RN CM regarding patient needing hospital follow-up appointment at [place]. Per Neoma Laming, patient uninsured and without PCP. Patient entered into [facility] program so he can obtain Tivicay and Truvada. She indicates patient set-up with [facility]. She indicates patient to discharge on Coumadin and will need hospital follow-up appointment and PT/INR on 08/12/14.  Spoke with Environmental education officer and no available appointments on 08/12/14.  Neoma Laming indicates she spoke with Hospitalist who she indicated stated patient okay to have appointment and PT/INR on 08/13/14.  Hospital follow-up appointment obtained on 08/13/14 at 1100 with Zettie Pho, PA. Appointment time placed on patient's AVS. Voicemail left for Tomi Bamberger, RN CM informing her of hospital follow-up appointment. No other needs identified.

## 2014-08-09 ENCOUNTER — Other Ambulatory Visit: Payer: Self-pay | Admitting: Internal Medicine

## 2014-08-09 DIAGNOSIS — B20 Human immunodeficiency virus [HIV] disease: Secondary | ICD-10-CM

## 2014-08-09 LAB — BASIC METABOLIC PANEL
Anion gap: 7 (ref 5–15)
BUN: 10 mg/dL (ref 6–20)
CHLORIDE: 105 mmol/L (ref 101–111)
CO2: 20 mmol/L — ABNORMAL LOW (ref 22–32)
Calcium: 8.8 mg/dL — ABNORMAL LOW (ref 8.9–10.3)
Creatinine, Ser: 1.14 mg/dL (ref 0.61–1.24)
GFR calc non Af Amer: >60 mL/min (ref >60)
GLUCOSE: 98 mg/dL (ref 65–99)
Potassium: 3.3 mmol/L — ABNORMAL LOW (ref 3.5–5.1)
Sodium: 132 mmol/L — ABNORMAL LOW (ref 135–145)

## 2014-08-09 LAB — PROTIME-INR
INR: 1.32 (ref 0.00–1.49)
Prothrombin Time: 16.5 seconds — ABNORMAL HIGH (ref 11.6–15.2)

## 2014-08-09 MED ORDER — SPIRONOLACTONE 50 MG PO TABS: 50.0000 mg | ORAL_TABLET | Freq: Every day | ORAL | Status: DC

## 2014-08-09 MED ORDER — LEVOFLOXACIN 750 MG PO TABS: 750.0000 mg | ORAL_TABLET | ORAL | Status: DC

## 2014-08-09 MED ORDER — EMTRICITABINE-TENOFOVIR DF 200-300 MG PO TABS: 1.0000 | ORAL_TABLET | Freq: Every day | ORAL | Status: AC

## 2014-08-09 MED ORDER — WARFARIN SODIUM 2.5 MG PO TABS: 2.5000 mg | ORAL_TABLET | Freq: Every day | ORAL | Status: DC

## 2014-08-09 MED ORDER — FUROSEMIDE 20 MG PO TABS: 20.0000 mg | ORAL_TABLET | Freq: Every day | ORAL | Status: AC

## 2014-08-09 MED ORDER — WARFARIN SODIUM 3 MG PO TABS: 3.0000 mg | ORAL_TABLET | Freq: Every day | ORAL | Status: DC

## 2014-08-09 MED ORDER — ENOXAPARIN SODIUM 60 MG/0.6ML ~~LOC~~ SOLN: 60.0000 mg | Freq: Two times a day (BID) | SUBCUTANEOUS | Status: AC

## 2014-08-09 MED ORDER — DOLUTEGRAVIR SODIUM 50 MG PO TABS: 50.0000 mg | ORAL_TABLET | Freq: Every day | ORAL | Status: AC

## 2014-08-09 MED ORDER — EFAVIRENZ-EMTRICITAB-TENOFOVIR 600-200-300 MG PO TABS: 1.0000 | ORAL_TABLET | Freq: Every day | ORAL | Status: DC

## 2014-08-09 NOTE — Discharge Instructions (Addendum)
Information on my medicine - Coumadin   (Warfarin)  This medication education was reviewed with me or my healthcare representative as part of my discharge preparation.  The pharmacist that spoke with me during my hospital stay was:  Roma Schanz, [facility]  Why was Coumadin prescribed for you? Coumadin was prescribed for you because you have a blood clot or a medical condition that can cause an increased risk of forming blood clots. Blood clots can cause serious health problems by blocking the flow of blood to the heart, lung, or brain. Coumadin can prevent harmful blood clots from forming. As a reminder your indication for Coumadin is:   Deep Vein Thrombosis Treatment  What test will check on my response to Coumadin? While on Coumadin (warfarin) you will need to have an INR test regularly to ensure that your dose is keeping you in the desired range. The INR (international normalized ratio) number is calculated from the result of the laboratory test called prothrombin time (PT).  If an INR APPOINTMENT HAS NOT ALREADY BEEN MADE FOR YOU please schedule an appointment to have this lab work done by your health care provider within 7 days. Your INR goal is usually a number between:  2 to 3 or your provider may give you a more narrow range like 2-2.5.  Ask your health care provider during an office visit what your goal INR is.  What  do you need to  know  About  COUMADIN? Take Coumadin (warfarin) exactly as prescribed by your healthcare provider about the same time each day.  DO NOT stop taking without talking to the doctor who prescribed the medication.  Stopping without other blood clot prevention medication to take the place of Coumadin may increase your risk of developing a new clot or stroke.  Get refills before you run out.  What do you do if you miss a dose? If you miss a dose, take it as soon as you remember on the same day then continue your regularly scheduled regimen the next day.  Do not take  two doses of Coumadin at the same time.  Important Safety Information A possible side effect of Coumadin (Warfarin) is an increased risk of bleeding. You should call your healthcare provider right away if you experience any of the following: ? Bleeding from an injury or your nose that does not stop. ? Unusual colored urine (red or dark brown) or unusual colored stools (red or black). ? Unusual bruising for unknown reasons. ? A serious fall or if you hit your head (even if there is no bleeding).  Some foods or medicines interact with Coumadin (warfarin) and might alter your response to warfarin. To help avoid this: ? Eat a balanced diet, maintaining a consistent amount of Vitamin K. ? Notify your provider about major diet changes you plan to make. ? Avoid alcohol or limit your intake to 1 drink for women and 2 drinks for men per day. (1 drink is 5 oz. wine, 12 oz. beer, or 1.5 oz. liquor.)  Make sure that ANY health care provider who prescribes medication for you knows that you are taking Coumadin (warfarin).  Also make sure the healthcare provider who is monitoring your Coumadin knows when you have started a new medication including herbals and non-prescription products.  Coumadin (Warfarin)  Major Drug Interactions  Increased Warfarin Effect Decreased Warfarin Effect  Alcohol (large quantities) Antibiotics (esp. Septra/Bactrim, Flagyl, Cipro) Amiodarone (Cordarone) Aspirin (ASA) Cimetidine (Tagamet) Megestrol (Megace) NSAIDs (ibuprofen, naproxen, etc.)  Piroxicam (Feldene) °Propafenone (Rythmol SR) °Propranolol (Inderal) °Isoniazid (INH) °Posaconazole (Noxafil) Barbiturates (Phenobarbital) °Carbamazepine (Tegretol) °Chlordiazepoxide (Librium) °Cholestyramine (Questran) °Griseofulvin °Oral Contraceptives °Rifampin °Sucralfate (Carafate) °Vitamin K  ° °Coumadin® (Warfarin) Major Herbal Interactions  °Increased Warfarin Effect Decreased Warfarin Effect  °Garlic °Ginseng °Ginkgo biloba  Coenzyme Q10 °Green tea °St. John’s wort   ° °Coumadin® (Warfarin) FOOD Interactions  °Eat a consistent number of servings per week of foods HIGH in Vitamin K °(1 serving = ½ cup)  °Collards (cooked, or boiled & drained) °Kale (cooked, or boiled & drained) °Mustard greens (cooked, or boiled & drained) °Parsley *serving size only = ¼ cup °Spinach (cooked, or boiled & drained) °Swiss chard (cooked, or boiled & drained) °Turnip greens (cooked, or boiled & drained)  °Eat a consistent number of servings per week of foods MEDIUM-HIGH in Vitamin K °(1 serving = 1 cup)  °Asparagus (cooked, or boiled & drained) °Broccoli (cooked, boiled & drained, or raw & chopped) °Brussel sprouts (cooked, or boiled & drained) *serving size only = ½ cup °Lettuce, raw (green leaf, endive, romaine) °Spinach, raw °Turnip greens, raw & chopped  ° °These websites have more information on Coumadin (warfarin):  www.coumadin.com; °www.ahrq.gov/consumer/coumadin.htm; ° ° °

## 2014-08-09 NOTE — Discharge Summary (Addendum)
Physician Discharge Summary  Logan Combs SLH:[id number] DOB: [birth year] DOA: 08/05/2014  PCP: [name], MD  Admit date: 08/05/2014 Discharge date: 08/09/2014  Recommendations for Outpatient Follow-up:  1. Pt will need to follow up with PCP in 2 weeks post discharge 2. Please obtain BMP and CBC on 08/13/14 3. Please check INR on 08/13/14 and adjust coumadin dose accordingly 4. Discontinue lovenox [place] when INR >2.0  Discharge Diagnoses:  Decompensated cirrhosis with ascites -Cannot rule out underlying SBP given cell count WBC 257 -Nevertheless, the patient will continue on levofloxacin for his community-acquired pneumonia -Follow up ascites cultures--neg -continue diuretics with furosemide and spironolactone after discharge -Medications were arranged for the patient. MATCH program -Discontinue IV fluids -paracentesis removed 3.5L -am BMP--serum creatinine 1.14 on the day of discharge Liver mass -The patient likely has hepatoma given his clinical presentation and alpha-fetoprotein 8074 -MRI liver--"area of decreased enhancement following contrast. Given the patient's cirrhosis and markedly elevated serum AFP level of 8,074, this is consistent with atypical hepatocellular carcinoma" -consulted and spoke with Dr. Phylliss Bob is not a good candidate for resection or ablation  -referral made to [place] by Dr. Lindi Adie Portal Vein Thrombosis -case discussed with Dr. Heidi Dach coumadin with lovenox bridge -set up follow appt with [facility] for INR check and follow up--appointment 08/13/2014 at 11 AM -Patient will go home with warfarin and Lovenox bridge  -Due to the patient's cirrhosis and concomitant levofloxacin, the patient will be sent home with warfarin 2.5 mg daily  Community acquired pneumonia -Clinically improving -Off supplemental oxygen -Continue levofloxacin for 4 additional days which will complete 7 days of therapy   Alcohol  dependence -Continue alcohol withdrawal protocol -Continue thiamine -no signs of withdraw during the admission Chronic hepatitis C without coma -Previously treated with interferon nearly 10 years ago -referral to Dr. Maceo Pro, hepatology at [facility] since Feb 2016 -ID changed him to [place] and truvada at time of d/c -remain off for now until he follows up with Dr. Megan Salon -pt set up with [facility] program but he has to follow up with ID office to fill out paperwork Deconditioning -Physical therapy evaluation--No PT followup  Discharge Condition: home  Disposition: home Follow-up Information    Follow up with [facility]     On 08/13/2014.   Why:  Appointment on 08/13/14 at 11:00 am with Zettie Pho, PA , for pt/inr draw   Contact information:   [address] [place] [address] [phone]      Follow up with Rulon Eisenmenger, MD In 2 weeks.   Specialty:  Hematology and Oncology   Contact information:   [place] [address] [phone]       Follow up with Michel Bickers, MD In 2 weeks.   Specialty:  Infectious Diseases   Contact information:   [address] [place] [address] [phone]       Diet:low sodium Wt Readings from Last 3 Encounters:  08/06/14 55.4 kg (122 lb 2.2 oz)  03/26/14 56.133 kg (123 lb 12 oz)  09/11/13 55.452 kg (122 lb 4 oz)    History of present illness:  64 year old male with history of chronic hepatitis C, HIV, liver cirrhosis, alcohol dependence presented with two-week history of increasing abdominal distention and abdominal pain. The patient also complained of shortness of breath with productive cough with yellow sputum. In the emergency department, the patient was noted to have  oxygen saturation of 86% with ambulation. Chest x-Quay revealed right lower lobe opacity concerning for pneumonia. The patient underwent paracentesis removing 3.5 L.  After paracentesis, the patient was breathing better with improving abdominal pain. Notably, the patient has been off his Atripla since February 2016. It appears that he may have lost his insurance, but he did not make any effort to contact his infectious disease physician. As a result, he has been off his anti-retroviral medications. After admission, workup revealed that the patient had a liver mass. MRI confirmed this was likely hepatoma. Medical oncology was consulted. The patient was ultimately referred to [facility] faxed.  The patient was started on warfarin for peripheral vein thrombosis. He was at home with Lovenox bridge. Follow-up appointment was made at [facility] on 08/13/14 for CBC, BMP and INR checked.    Consultants: Med Onc--Dr. Lindi Adie IR  Discharge Exam: Filed Vitals:   08/09/14 0547  BP: 91/66  Pulse: 95  Temp: 98 F (36.7 C)  Resp: 18   Filed Vitals:   08/07/14 2125 08/08/14 0600 08/08/14 2204 08/09/14 0547  BP: 108/66 98/72 108/83 91/66  Pulse: 74 102 84 95  Temp: 98.3 F (36.8 C) 97.8 F (36.6 C) 98 F (36.7 C) 98 F (36.7 C)  TempSrc: Oral Oral Oral Oral  Resp: 18 18 18 18   Height:      Weight:      SpO2: 100% 95% 98% 95%   General: A&O x 3, NAD, pleasant, cooperative Cardiovascular: RRR, no rub, no gallop, no S3 Respiratory: Bibasilar crackles. No wheezing. Lungs clear to auscultation  Abdomen:soft, nontender, nondistended, positive bowel sounds Extremities: No edema, No lymphangitis, no petechiae  Discharge Instructions      Discharge Instructions    Diet - low sodium heart healthy    Complete by:  As directed      Increase activity slowly    Complete by:  As directed             Medication List    STOP taking these medications        efavirenz-emtricitabine-tenofovir 600-200-300 MG per tablet  Commonly known as:  ATRIPLA     Halcinonide 0.1 % Crea  Commonly known as:  HALOG     sertraline 100  MG tablet  Commonly known as:  ZOLOFT     traMADol 50 MG tablet  Commonly known as:  ULTRAM      TAKE these medications        dolutegravir 50 MG tablet  Commonly known as:  TIVICAY  Take 1 tablet (50 mg total) by mouth daily.     emtricitabine-tenofovir 200-300 MG per tablet  Commonly known as:  TRUVADA  Take 1 tablet by mouth daily.     enoxaparin 60 MG/0.6ML injection  Commonly known as:  LOVENOX  Inject 0.6 mLs (60 mg total) into the skin every 12 (twelve) hours.     furosemide 20 MG tablet  Commonly known as:  LASIX  Take 1 tablet (20 mg total) by mouth daily.     levofloxacin 750 MG tablet  Commonly known as:  LEVAQUIN  Take 1 tablet (750 mg total) by mouth daily.     spironolactone 50 MG tablet  Commonly known as:  ALDACTONE  Take 1 tablet (50 mg total) by mouth daily.     warfarin 2.5 MG tablet  Commonly known as:  COUMADIN  Take 1 tablet (2.5 mg total) by mouth daily.  The results of significant diagnostics from this hospitalization (including imaging, microbiology, ancillary and laboratory) are listed below for reference.    Significant Diagnostic Studies: Dg Chest 2 View  08/05/2014   CLINICAL DATA:  Progressive abdominal distension an abdominal pain, several months duration. Productive cough.  EXAM: CHEST  2 VIEW  COMPARISON:  03/12/2008.  FINDINGS: Heart size is normal. Mediastinal shadows are normal. The left lung and left chest are clear. On the right, there is volume loss/ infiltrate in the right middle lobe and a right-sided effusion. Upper lobe is clear.  IMPRESSION: Volume loss/ infiltrate in the right lower lobe.  Right effusion.   Electronically Signed   By: Nelson Chimes M.D.   On: 08/05/2014 20:45   Mr Liver W Wo Contrast  08/08/2014   CLINICAL DATA:  Liver lesion on ultrasound. History of cirrhosis and HIV infection. Initial encounter.  EXAM: MRI ABDOMEN WITHOUT AND WITH CONTRAST  TECHNIQUE: Multiplanar multisequence MR imaging of the  abdomen was performed both before and after the administration of intravenous contrast.  CONTRAST:  7 ml Eovist  COMPARISON:  Ultrasound 08/06/2014.  CT 10/13/2004.  FINDINGS: Lower chest: Distal esophageal wall thickening noted with probable small distal esophageal varices. There is a small left pleural effusion and mild bibasilar atelectasis.  Hepatobiliary: There are diffuse morphologic changes of cirrhosis with contour irregularity of the liver and relative enlargement of the left and caudate lobes. Prior to contrast, the liver is mildly heterogeneous on T2 weighted images without apparent focal mass lesion. Post-contrast, there is a subtle hypovascular lesion posteriorly in the right hepatic lobe, corresponding with the ultrasound finding. This is best seen on series 701, measuring 4.5 x 3.6 cm transverse on image 25. This lesion measures approximately 3.9 cm cephalocaudad and involves segments 6 and 7. There is no restricted diffusion within this lesion. No other suspicious focal lesions are identified. There is heterogeneous enhancement of the liver with multiple small regenerating nodules. There is occlusive thrombus within the right portal vein and nonocclusive thrombus in the main portal vein. No definite enhancement of this thrombus identified. The left portal vein and hepatic veins appear patent. Mild nonspecific gallbladder wall thickening. No significant biliary dilatation.  Pancreas: A well-circumscribed 3.5 cm oval mass involving or adjacent to the pancreatic tail is unchanged from the prior CT. This demonstrates signal and enhancement identical to the spleen and is consistent with an accessory splenule. The pancreas itself appears unremarkable without ductal dilatation.  Spleen: Mild splenomegaly without focal abnormality. Accessory splenule as above.  Adrenals/Urinary Tract: Both adrenal glands appear normal.The kidneys appear normal without evidence of urinary tract calculus or hydronephrosis.  Bladder not imaged.  Stomach/Bowel: Mild diffuse bowel wall thickening is nonspecific in light of the patient's ascites. No significant distension or focal abnormality identified.  Vascular/Lymphatic: As above, there is portal vein thrombosis, primarily within the right lobe. The superior mesenteric and splenic veins are patent. No significant arterial abnormalities identified. There are small distal esophageal varices. Small lymph nodes within the retroperitoneum and porta hepatis are likely reactive.  Other: There is a large amount of ascites. No peritoneal nodularity or suspicious enhancement demonstrated.  Musculoskeletal: No acute or significant osseous findings. Mild lumbar spondylosis and convex left scoliosis noted.  IMPRESSION: 1. Portal vein thrombosis with occlusive components in the right lobe. There is no definite enhancement of this thrombus to confirm tumor thrombus, although that is likely in this clinical context. 2. The previously identified lesion within the right hepatic lobe on ultrasound  is subtle on MRI, manifesting as an area of decreased enhancement following contrast. Given the patient's cirrhosis and markedly elevated serum AFP level of 8,074, this is consistent with atypical hepatocellular carcinoma. No distant metastases identified. 3. Evidence of portal hypertension with splenomegaly, ascites and distal esophageal varices. Stable accessory splenule adjacent to the pancreatic tail.   Electronically Signed   By: Richardean Sale M.D.   On: 08/08/2014 08:44   US Paracentesis  08/06/2014   INDICATION: Symptomatic ascites.  EXAM: ULTRASOUND-GUIDED PARACENTESIS  COMPARISON:  None.  MEDICATIONS: 10 cc 1% lidocaine  COMPLICATIONS: None immediate  TECHNIQUE: Informed written consent was obtained from the patient after a discussion of the risks, benefits and alternatives to treatment. A timeout was performed prior to the initiation of the procedure.  Initial ultrasound scanning demonstrates a  large amount of ascites within the left lower abdominal quadrant. The left lower abdomen was prepped and draped in the usual sterile fashion. 1% lidocaine with epinephrine was used for local anesthesia. Under direct ultrasound guidance, a 19 gauge, 7-cm, Yueh catheter was introduced. An ultrasound image was saved for documentation purposed. The paracentesis was performed. The catheter was removed and a dressing was applied. The patient tolerated the procedure well without immediate post procedural complication.  FINDINGS: A total of approximately 3.5 liters of yellow fluid was removed. Samples were sent to the laboratory as requested by the clinical team.  IMPRESSION: Successful ultrasound-guided paracentesis yielding 3.5 liters of peritoneal fluid.  Read by:  Lavonia Drafts [facility]   Electronically Signed   By: Sandi Mariscal M.D.   On: 08/06/2014 15:59   US Abdomen Limited Ruq  08/06/2014   CLINICAL DATA:  Abdominal pain and distension, history of hepatitis-C and HIV  EXAM: US ABDOMEN LIMITED - RIGHT UPPER QUADRANT  COMPARISON:  Abdominal ultrasound of September 06, 2012  FINDINGS: Gallbladder:  The gallbladder is adequately distended. There are echogenic mobile shadowing stones. There is no gallbladder wall thickening nor pericholecystic fluid. There is no positive sonographic Murphy's sign.  Common bile duct:  Diameter: 3.7 cm  Liver:  The liver appears shrunken its surface irregular. The echotexture is heterogeneous. There is a heterogeneous slightly hyperechoic mass in the right hepatic lobe measuring 4.7 x 3.9 x 4.4 cm. There is no intrahepatic ductal dilation. There is a moderate amount of ascites surrounding the liver.  IMPRESSION: 1. Cirrhotic changes within the liver. A right lobe mass is suspected. Hepatic protocol MRI is recommended. 2. Gallstones without evidence of acute cholecystitis. 3. Moderate amount of ascites.   Electronically Signed   By: Trinidad Ingle  [place] M.D.   On: 08/06/2014 16:32      Microbiology: Recent Results (from the past 240 hour(s))  Blood culture (routine x 2)     Status: None (Preliminary result)   Collection Time: 08/06/14 12:03 AM  Result Value Ref Range Status   Specimen Description RIGHT ANTECUBITAL  Final   Special Requests BOTTLES DRAWN AEROBIC AND ANAEROBIC 10CC  Final   Culture NO GROWTH 2 DAYS  Final   Report Status PENDING  Incomplete  Blood culture (routine x 2)     Status: None (Preliminary result)   Collection Time: 08/06/14 12:10 AM  Result Value Ref Range Status   Specimen Description LEFT ANTECUBITAL  Final   Special Requests BOTTLES DRAWN AEROBIC ONLY 10CC  Final   Culture NO GROWTH 2 DAYS  Final   Report Status PENDING  Incomplete  Gram stain     Status: None  Collection Time: 08/06/14  4:10 PM  Result Value Ref Range Status   Specimen Description FLUID PERITONEAL  Final   Special Requests NONE  Final   Gram Stain   Final    MODERATE WBC PRESENT,BOTH PMN AND MONONUCLEAR NO ORGANISMS SEEN    Report Status 08/06/2014 FINAL  Final  Culture, body fluid-bottle     Status: None (Preliminary result)   Collection Time: 08/06/14  4:10 PM  Result Value Ref Range Status   Specimen Description FLUID PERITONEAL  Final   Special Requests NONE  Final   Culture NO GROWTH 2 DAYS  Final   Report Status PENDING  Incomplete     Labs: Basic Metabolic Panel:  Recent Labs Lab 08/05/14 1938 08/07/14 0513 08/08/14 0430 08/09/14 0538  NA 136 135 134* 132*  K 3.4* 3.8 3.5 3.3*  CL 107 108 104 105  CO2 22 18* 22 20*  GLUCOSE 87 118* 100* 98  BUN 6 9 8 10   CREATININE 0.90 1.11 0.95 1.14  CALCIUM 8.8* 8.4* 8.6* 8.8*   Liver Function Tests:  Recent Labs Lab 08/05/14 1938 08/07/14 0513  AST 81* 80*  ALT 35 35  ALKPHOS 172* 149*  BILITOT 1.3* 1.3*  PROT 8.3* 7.9  ALBUMIN 2.2* 2.0*    Recent Labs Lab 08/05/14 1938  LIPASE 97*    Recent Labs Lab 08/05/14 2002  AMMONIA 48*   CBC:  Recent Labs Lab 08/05/14 1938  08/07/14 0513 08/08/14 0430  WBC 6.3 6.0 6.0  NEUTROABS 3.4  --   --   HGB 11.7* 10.9* 10.8*  HCT 34.6* 32.5* 32.5*  MCV 78.5 79.7 78.5  PLT 345 331 348   Cardiac Enzymes: No results for input(s): CKTOTAL, CKMB, CKMBINDEX, TROPONINI in the last 168 hours. BNP: Invalid input(s): POCBNP CBG: No results for input(s): GLUCAP in the last 168 hours.  Time coordinating discharge:  Greater than 30 minutes  Signed:  Kinnie Kaupp, DO [facility] Pager: [phone] 08/09/2014, 1:00 PM

## 2014-08-09 NOTE — Progress Notes (Signed)
Patient was discharged home by MD order; discharged instructions review and give to patient with care notes and prescriptions; IV DIC; patient will be escorted to the car by a volunteer via wheelchair.  

## 2014-08-09 NOTE — Progress Notes (Addendum)
ANTICOAGULATION CONSULT NOTE - Follow Up Consult  Pharmacy Consult for warfarin Indication: portal vein thrombosis  Allergies  Allergen Reactions  . Penicillins Anaphylaxis  . Codeine Hives and Itching    Patient Measurements: Height: 5\' 11"  (180.3 cm) (per 09/11/13 reading) Weight: 122 lb 2.2 oz (55.4 kg) IBW/kg (Calculated) : 75.3   Vital Signs: Temp: 98 F (36.7 C) (07/08 0547) Temp Source: Oral (07/08 0547) BP: 91/66 mmHg (07/08 0547) Pulse Rate: 95 (07/08 0547)  Labs:  Recent Labs  08/07/14 0513 08/08/14 0430 08/08/14 1239 08/09/14 0538  HGB 10.9* 10.8*  --   --   HCT 32.5* 32.5*  --   --   PLT 331 348  --   --   APTT  --   --  43*  --   LABPROT  --   --  16.9* 16.5*  INR  --   --  1.36 1.32  CREATININE 1.11 0.95  --  1.14    Estimated Creatinine Clearance: 52 mL/min (by C-G formula based on Cr of 1.14).   Assessment: 43 YOM with history of HIV and hep C. Found to have hepatoma and with decompensated cirrhosis with ascites. To start anticoagulation with Lovenox and warfarin for portal vein thrombosis. No coags on file, but INR is elevated slightly above normal given liver disease. SCr 0.95, est CrCL ~60-71mL/min. hgb 10.8- low but stable, plts 348. No overt bleeding noted.  Patient is on Levaquin which can increase INR, however Atripla can lower INR. Monitor closely.  Goal of Therapy:  INR 2-3 Monitor platelets by anticoagulation protocol: Yes   Plan:  Spoke with Dr. Carles Collet, agree with continuing warfarin 2.5 mg daily and check INR Tuesday -Lovenox 60mg  subQ q12h per MD (agree with dosing). Continue for 5-7 days and until INR 2-3. -follow for s/s bleeding -will provide education prior to discharge  Joya San, PharmD Clinical Pharmacist 08/09/2014 10:28 AM

## 2014-08-09 NOTE — Care Management Note (Signed)
Case Management Note  Patient Details  Name: Logan Combs MRN: [id number] Date of Birth: 1950/08/13  Subjective/Objective:   Patient is for dc today, he was informed to go to the ID clinic to fill out paper work for his meds, also NCM did Match for patient meds, he was informed to pick these up at the [facility] outpatient pharmacy.  Also patient has a f/u apt at [facility] clinic for his pt/inr to be checked on 7/12 at 2pm, patient informed not to miss this apt.  Patient stated he understands and he will go to the ID clinic first before he goes to the pharmacy at dc today.                   Action/Plan:   Expected Discharge Date:                  Expected Discharge Plan:  [place]  In-House Referral:     Discharge planning Services  CM Consult, [place] Program (Overrides entered for 30d supplies of Tivicay and Truvada.)  Post Acute Care Choice:    Choice offered to:     DME Arranged:    DME Agency:     HH Arranged:    [place] Agency:     Status of Service:  Completed, signed off  Medicare Important Message Given:    Date Medicare IM Given:    Medicare IM give by:    Date Additional Medicare IM Given:    Additional Medicare Important Message give by:     If discussed at [place] of Stay Meetings, dates discussed:    Additional Comments:  Zenon Mayo, RN 08/09/2014, 6:59 PM

## 2014-08-09 NOTE — Progress Notes (Signed)
Utilization Review completed. Latroya Ng RN BSN CM 

## 2014-08-11 LAB — CULTURE, BLOOD (ROUTINE X 2)
Culture: NO GROWTH
Culture: NO GROWTH

## 2014-08-11 LAB — CULTURE, BODY FLUID W GRAM STAIN -BOTTLE

## 2014-08-11 LAB — CULTURE, BODY FLUID-BOTTLE: CULTURE: NO GROWTH

## 2014-08-12 ENCOUNTER — Inpatient Hospital Stay (HOSPITAL_COMMUNITY)
Admission: EM | Admit: 2014-08-12 | Discharge: 2014-08-23 | DRG: 981 | Disposition: A | Discharge status: Skilled Nursing Facility | Payer: Self-pay | Attending: Family Medicine | Admitting: Family Medicine

## 2014-08-12 ENCOUNTER — Inpatient Hospital Stay (HOSPITAL_COMMUNITY): Payer: Self-pay

## 2014-08-12 ENCOUNTER — Encounter (HOSPITAL_COMMUNITY): Payer: Self-pay

## 2014-08-12 DIAGNOSIS — E872 Acidosis: Secondary | ICD-10-CM | POA: Diagnosis present

## 2014-08-12 DIAGNOSIS — K922 Gastrointestinal hemorrhage, unspecified: Secondary | ICD-10-CM | POA: Diagnosis present

## 2014-08-12 DIAGNOSIS — K767 Hepatorenal syndrome: Secondary | ICD-10-CM | POA: Diagnosis present

## 2014-08-12 DIAGNOSIS — F329 Major depressive disorder, single episode, unspecified: Secondary | ICD-10-CM | POA: Diagnosis present

## 2014-08-12 DIAGNOSIS — R000 Tachycardia, unspecified: Secondary | ICD-10-CM | POA: Diagnosis present

## 2014-08-12 DIAGNOSIS — E870 Hyperosmolality and hypernatremia: Secondary | ICD-10-CM | POA: Diagnosis present

## 2014-08-12 DIAGNOSIS — W19XXXA Unspecified fall, initial encounter: Secondary | ICD-10-CM

## 2014-08-12 DIAGNOSIS — Z21 Asymptomatic human immunodeficiency virus [HIV] infection status: Secondary | ICD-10-CM | POA: Diagnosis present

## 2014-08-12 DIAGNOSIS — K219 Gastro-esophageal reflux disease without esophagitis: Secondary | ICD-10-CM | POA: Diagnosis present

## 2014-08-12 DIAGNOSIS — R627 Adult failure to thrive: Secondary | ICD-10-CM | POA: Diagnosis present

## 2014-08-12 DIAGNOSIS — N189 Chronic kidney disease, unspecified: Secondary | ICD-10-CM | POA: Diagnosis present

## 2014-08-12 DIAGNOSIS — B182 Chronic viral hepatitis C: Secondary | ICD-10-CM | POA: Diagnosis present

## 2014-08-12 DIAGNOSIS — I8511 Secondary esophageal varices with bleeding: Secondary | ICD-10-CM | POA: Diagnosis present

## 2014-08-12 DIAGNOSIS — Z9911 Dependence on respirator [ventilator] status: Secondary | ICD-10-CM

## 2014-08-12 DIAGNOSIS — K9429 Other complications of gastrostomy: Secondary | ICD-10-CM

## 2014-08-12 DIAGNOSIS — I248 Other forms of acute ischemic heart disease: Secondary | ICD-10-CM | POA: Diagnosis present

## 2014-08-12 DIAGNOSIS — R160 Hepatomegaly, not elsewhere classified: Secondary | ICD-10-CM | POA: Diagnosis present

## 2014-08-12 DIAGNOSIS — Z87891 Personal history of nicotine dependence: Secondary | ICD-10-CM

## 2014-08-12 DIAGNOSIS — R490 Dysphonia: Secondary | ICD-10-CM | POA: Diagnosis present

## 2014-08-12 DIAGNOSIS — D699 Hemorrhagic condition, unspecified: Secondary | ICD-10-CM

## 2014-08-12 DIAGNOSIS — Z789 Other specified health status: Secondary | ICD-10-CM | POA: Diagnosis present

## 2014-08-12 DIAGNOSIS — E878 Other disorders of electrolyte and fluid balance, not elsewhere classified: Secondary | ICD-10-CM | POA: Diagnosis present

## 2014-08-12 DIAGNOSIS — D72829 Elevated white blood cell count, unspecified: Secondary | ICD-10-CM | POA: Diagnosis present

## 2014-08-12 DIAGNOSIS — B1920 Unspecified viral hepatitis C without hepatic coma: Secondary | ICD-10-CM | POA: Diagnosis present

## 2014-08-12 DIAGNOSIS — D638 Anemia in other chronic diseases classified elsewhere: Secondary | ICD-10-CM | POA: Diagnosis present

## 2014-08-12 DIAGNOSIS — B370 Candidal stomatitis: Secondary | ICD-10-CM

## 2014-08-12 DIAGNOSIS — B20 Human immunodeficiency virus [HIV] disease: Secondary | ICD-10-CM | POA: Diagnosis present

## 2014-08-12 DIAGNOSIS — K7040 Alcoholic hepatic failure without coma: Secondary | ICD-10-CM | POA: Diagnosis present

## 2014-08-12 DIAGNOSIS — F1020 Alcohol dependence, uncomplicated: Secondary | ICD-10-CM | POA: Diagnosis present

## 2014-08-12 DIAGNOSIS — K920 Hematemesis: Secondary | ICD-10-CM | POA: Insufficient documentation

## 2014-08-12 DIAGNOSIS — D62 Acute posthemorrhagic anemia: Secondary | ICD-10-CM | POA: Insufficient documentation

## 2014-08-12 DIAGNOSIS — R188 Other ascites: Secondary | ICD-10-CM

## 2014-08-12 DIAGNOSIS — J9690 Respiratory failure, unspecified, unspecified whether with hypoxia or hypercapnia: Secondary | ICD-10-CM

## 2014-08-12 DIAGNOSIS — Z8505 Personal history of malignant neoplasm of liver: Secondary | ICD-10-CM

## 2014-08-12 DIAGNOSIS — I5041 Acute combined systolic (congestive) and diastolic (congestive) heart failure: Secondary | ICD-10-CM | POA: Diagnosis not present

## 2014-08-12 DIAGNOSIS — N170 Acute kidney failure with tubular necrosis: Secondary | ICD-10-CM | POA: Diagnosis not present

## 2014-08-12 DIAGNOSIS — I81 Portal vein thrombosis: Secondary | ICD-10-CM | POA: Diagnosis present

## 2014-08-12 DIAGNOSIS — R0902 Hypoxemia: Secondary | ICD-10-CM

## 2014-08-12 DIAGNOSIS — Z7901 Long term (current) use of anticoagulants: Secondary | ICD-10-CM

## 2014-08-12 DIAGNOSIS — Z880 Allergy status to penicillin: Secondary | ICD-10-CM

## 2014-08-12 DIAGNOSIS — I8390 Asymptomatic varicose veins of unspecified lower extremity: Secondary | ICD-10-CM | POA: Diagnosis present

## 2014-08-12 DIAGNOSIS — T45515A Adverse effect of anticoagulants, initial encounter: Secondary | ICD-10-CM | POA: Diagnosis present

## 2014-08-12 DIAGNOSIS — R7989 Other specified abnormal findings of blood chemistry: Secondary | ICD-10-CM

## 2014-08-12 DIAGNOSIS — E43 Unspecified severe protein-calorie malnutrition: Secondary | ICD-10-CM | POA: Diagnosis present

## 2014-08-12 DIAGNOSIS — Z6820 Body mass index (BMI) 20.0-20.9, adult: Secondary | ICD-10-CM

## 2014-08-12 DIAGNOSIS — N179 Acute kidney failure, unspecified: Secondary | ICD-10-CM | POA: Diagnosis present

## 2014-08-12 DIAGNOSIS — J189 Pneumonia, unspecified organism: Secondary | ICD-10-CM | POA: Diagnosis not present

## 2014-08-12 DIAGNOSIS — E861 Hypovolemia: Secondary | ICD-10-CM | POA: Diagnosis not present

## 2014-08-12 DIAGNOSIS — R739 Hyperglycemia, unspecified: Secondary | ICD-10-CM | POA: Diagnosis present

## 2014-08-12 DIAGNOSIS — E876 Hypokalemia: Secondary | ICD-10-CM | POA: Diagnosis not present

## 2014-08-12 DIAGNOSIS — R1084 Generalized abdominal pain: Secondary | ICD-10-CM | POA: Insufficient documentation

## 2014-08-12 DIAGNOSIS — D509 Iron deficiency anemia, unspecified: Secondary | ICD-10-CM | POA: Diagnosis present

## 2014-08-12 DIAGNOSIS — Z79899 Other long term (current) drug therapy: Secondary | ICD-10-CM

## 2014-08-12 DIAGNOSIS — E0781 Sick-euthyroid syndrome: Secondary | ICD-10-CM | POA: Diagnosis present

## 2014-08-12 DIAGNOSIS — R791 Abnormal coagulation profile: Secondary | ICD-10-CM | POA: Diagnosis present

## 2014-08-12 DIAGNOSIS — C220 Liver cell carcinoma: Secondary | ICD-10-CM | POA: Diagnosis present

## 2014-08-12 DIAGNOSIS — Z978 Presence of other specified devices: Secondary | ICD-10-CM | POA: Insufficient documentation

## 2014-08-12 DIAGNOSIS — D6832 Hemorrhagic disorder due to extrinsic circulating anticoagulants: Secondary | ICD-10-CM | POA: Diagnosis present

## 2014-08-12 DIAGNOSIS — Z452 Encounter for adjustment and management of vascular access device: Secondary | ICD-10-CM

## 2014-08-12 DIAGNOSIS — R740 Nonspecific elevation of levels of transaminase and lactic acid dehydrogenase [LDH]: Secondary | ICD-10-CM | POA: Diagnosis present

## 2014-08-12 DIAGNOSIS — Z515 Encounter for palliative care: Secondary | ICD-10-CM | POA: Insufficient documentation

## 2014-08-12 DIAGNOSIS — G9341 Metabolic encephalopathy: Secondary | ICD-10-CM | POA: Diagnosis present

## 2014-08-12 DIAGNOSIS — R570 Cardiogenic shock: Secondary | ICD-10-CM | POA: Diagnosis present

## 2014-08-12 DIAGNOSIS — Z66 Do not resuscitate: Secondary | ICD-10-CM | POA: Clinically undetermined

## 2014-08-12 DIAGNOSIS — I10 Essential (primary) hypertension: Secondary | ICD-10-CM | POA: Diagnosis present

## 2014-08-12 DIAGNOSIS — Z885 Allergy status to narcotic agent status: Secondary | ICD-10-CM

## 2014-08-12 DIAGNOSIS — J9811 Atelectasis: Secondary | ICD-10-CM | POA: Diagnosis not present

## 2014-08-12 DIAGNOSIS — I129 Hypertensive chronic kidney disease with stage 1 through stage 4 chronic kidney disease, or unspecified chronic kidney disease: Secondary | ICD-10-CM | POA: Diagnosis present

## 2014-08-12 DIAGNOSIS — F32A Depression, unspecified: Secondary | ICD-10-CM | POA: Diagnosis present

## 2014-08-12 DIAGNOSIS — I214 Non-ST elevation (NSTEMI) myocardial infarction: Secondary | ICD-10-CM | POA: Diagnosis not present

## 2014-08-12 DIAGNOSIS — R29898 Other symptoms and signs involving the musculoskeletal system: Secondary | ICD-10-CM | POA: Insufficient documentation

## 2014-08-12 DIAGNOSIS — K7031 Alcoholic cirrhosis of liver with ascites: Principal | ICD-10-CM | POA: Diagnosis present

## 2014-08-12 DIAGNOSIS — R0600 Dyspnea, unspecified: Secondary | ICD-10-CM

## 2014-08-12 DIAGNOSIS — R579 Shock, unspecified: Secondary | ICD-10-CM

## 2014-08-12 LAB — CBC
HEMATOCRIT: 22.9 % — AB (ref 39.0–52.0)
Hemoglobin: 7.7 g/dL — ABNORMAL LOW (ref 13.0–17.0)
MCH: 27.1 pg (ref 26.0–34.0)
MCHC: 33.6 g/dL (ref 30.0–36.0)
MCV: 80.6 fL (ref 78.0–100.0)
Platelets: 294 10*3/uL (ref 150–400)
RBC: 2.84 MIL/uL — ABNORMAL LOW (ref 4.22–5.81)
RDW: 19 % — AB (ref 11.5–15.5)
WBC: 12.1 10*3/uL — ABNORMAL HIGH (ref 4.0–10.5)

## 2014-08-12 LAB — I-STAT CG4 LACTIC ACID, ED
LACTIC ACID, VENOUS: 8.34 mmol/L — AB (ref 0.5–2.0)
Lactic Acid, Venous: 8.39 mmol/L (ref 0.5–2.0)

## 2014-08-12 LAB — I-STAT CHEM 8, ED
BUN: 24 mg/dL — AB (ref 6–20)
CALCIUM ION: 1.06 mmol/L — AB (ref 1.13–1.30)
Chloride: 110 mmol/L (ref 101–111)
Creatinine, Ser: 1.4 mg/dL — ABNORMAL HIGH (ref 0.61–1.24)
GLUCOSE: 146 mg/dL — AB (ref 65–99)
HEMATOCRIT: 27 % — AB (ref 39.0–52.0)
Hemoglobin: 9.2 g/dL — ABNORMAL LOW (ref 13.0–17.0)
Potassium: 4 mmol/L (ref 3.5–5.1)
Sodium: 136 mmol/L (ref 135–145)
TCO2: 13 mmol/L (ref 0–100)

## 2014-08-12 LAB — ABO/RH: ABO/RH(D): A POS

## 2014-08-12 LAB — COMPREHENSIVE METABOLIC PANEL
ALK PHOS: 142 U/L — AB (ref 38–126)
ALT: 48 U/L (ref 17–63)
AST: 140 U/L — AB (ref 15–41)
Albumin: 2.1 g/dL — ABNORMAL LOW (ref 3.5–5.0)
Anion gap: 12 (ref 5–15)
BILIRUBIN TOTAL: 1.3 mg/dL — AB (ref 0.3–1.2)
BUN: 24 mg/dL — AB (ref 6–20)
CALCIUM: 8.5 mg/dL — AB (ref 8.9–10.3)
CHLORIDE: 108 mmol/L (ref 101–111)
CO2: 16 mmol/L — ABNORMAL LOW (ref 22–32)
CREATININE: 1.59 mg/dL — AB (ref 0.61–1.24)
GFR calc Af Amer: 52 mL/min — ABNORMAL LOW (ref >60)
GFR calc non Af Amer: 45 mL/min — ABNORMAL LOW (ref >60)
GLUCOSE: 153 mg/dL — AB (ref 65–99)
Potassium: 3.7 mmol/L (ref 3.5–5.1)
Sodium: 136 mmol/L (ref 135–145)
Total Protein: 6.9 g/dL (ref 6.5–8.1)

## 2014-08-12 LAB — PROTIME-INR
INR: 2.99 — ABNORMAL HIGH (ref 0.00–1.49)
INR: 1.67 — ABNORMAL HIGH (ref 0.00–1.49)
PROTHROMBIN TIME: 19.7 s — AB (ref 11.6–15.2)
Prothrombin Time: 30.5 seconds — ABNORMAL HIGH (ref 11.6–15.2)

## 2014-08-12 LAB — PREPARE RBC (CROSSMATCH)

## 2014-08-12 LAB — LIPASE, BLOOD: LIPASE: 44 U/L (ref 22–51)

## 2014-08-12 LAB — CORTISOL: Cortisol, Plasma: 65.6 ug/dL

## 2014-08-12 LAB — POC OCCULT BLOOD, ED: FECAL OCCULT BLD: POSITIVE — AB

## 2014-08-12 MED ORDER — SODIUM CHLORIDE 0.9 % IV SOLN
80.0000 mg | Freq: Once | INTRAVENOUS | Status: AC
  Administered 2014-08-12: 80 mg via INTRAVENOUS
  Filled 2014-08-12: qty 80

## 2014-08-12 MED ORDER — PANTOPRAZOLE SODIUM 40 MG IV SOLR
40.0000 mg | Freq: Two times a day (BID) | INTRAVENOUS | Status: DC
  Administered 2014-08-16 – 2014-08-23 (×15): 40 mg via INTRAVENOUS
  Filled 2014-08-12 (×16): qty 40

## 2014-08-12 MED ORDER — MORPHINE SULFATE 2 MG/ML IJ SOLN
1.0000 mg | Freq: Once | INTRAMUSCULAR | Status: AC
  Administered 2014-08-12: 1 mg via INTRAVENOUS
  Filled 2014-08-12: qty 1

## 2014-08-12 MED ORDER — OCTREOTIDE LOAD VIA INFUSION
50.0000 ug | Freq: Once | INTRAVENOUS | Status: AC
  Administered 2014-08-12: 50 ug via INTRAVENOUS
  Filled 2014-08-12: qty 25

## 2014-08-12 MED ORDER — ONDANSETRON HCL 4 MG/2ML IJ SOLN
4.0000 mg | Freq: Once | INTRAMUSCULAR | Status: AC
  Administered 2014-08-12: 4 mg via INTRAVENOUS
  Filled 2014-08-12: qty 2

## 2014-08-12 MED ORDER — ONDANSETRON HCL 4 MG PO TABS: 4.0000 mg | ORAL_TABLET | Freq: Four times a day (QID) | ORAL | Status: DC | PRN

## 2014-08-12 MED ORDER — SODIUM CHLORIDE 0.9 % IV SOLN
8.0000 mg/h | INTRAVENOUS | Status: AC
  Administered 2014-08-12 – 2014-08-14 (×5): 8 mg/h via INTRAVENOUS
  Filled 2014-08-12 (×14): qty 80

## 2014-08-12 MED ORDER — SODIUM CHLORIDE 0.9 % IV BOLUS (SEPSIS)
1000.0000 mL | Freq: Once | INTRAVENOUS | Status: AC
Start: 2014-08-12 — End: 2014-08-12
  Administered 2014-08-12: 1000 mL via INTRAVENOUS

## 2014-08-12 MED ORDER — EMPTY CONTAINERS FLEXIBLE MISC
1500.0000 [IU] | Status: AC
  Administered 2014-08-12: 1500 [IU] via INTRAVENOUS
  Filled 2014-08-12: qty 60

## 2014-08-12 MED ORDER — ONDANSETRON HCL 4 MG/2ML IJ SOLN
4.0000 mg | Freq: Four times a day (QID) | INTRAMUSCULAR | Status: DC | PRN
  Administered 2014-08-13 – 2014-08-20 (×7): 4 mg via INTRAVENOUS
  Filled 2014-08-12 (×7): qty 2

## 2014-08-12 MED ORDER — VITAMIN K1 10 MG/ML IJ SOLN
10.0000 mg | INTRAVENOUS | Status: AC
  Administered 2014-08-12: 10 mg via INTRAVENOUS
  Filled 2014-08-12: qty 1

## 2014-08-12 MED ORDER — SODIUM CHLORIDE 0.9 % IV SOLN
Freq: Once | INTRAVENOUS | Status: AC
  Administered 2014-08-12: 17:00:00 via INTRAVENOUS

## 2014-08-12 MED ORDER — SODIUM CHLORIDE 0.9 % IV SOLN
50.0000 ug/h | INTRAVENOUS | Status: DC
  Administered 2014-08-12 – 2014-08-18 (×12): 50 ug/h via INTRAVENOUS
  Filled 2014-08-12 (×28): qty 1

## 2014-08-12 NOTE — ED Notes (Signed)
MD at bedside. 

## 2014-08-12 NOTE — ED Notes (Signed)
Per [facility]- Recent discharge from [facility] last Friday. DX with liver Ca. Vomiting started Friday however no blood. Bloody diarrhea started Sunday. Bloody emesis started yesterday. [facility] observed bright red emesis with large clots in sink. Hypotensive on scene. Given 400cc NS in route. Total of 8mg  Zofran IVP. Pt remained alert and active with care. Pt is weak. Pt c/o of generalized stomach pain. Pt states "they removed a lot of fluid from my stomach" appears to be retaining fluid at this time. Pt has multiple skin tears and lacerations. Pt states syncopal episodes multiple times before arrival. c-collar intact.Marland Kitchen Pt placed on coumadin this last hospital stay

## 2014-08-12 NOTE — ED Notes (Signed)
Bed: WA20 Expected date:  Expected time:  Means of arrival:  Comments: EMS/57F/hematamesis

## 2014-08-12 NOTE — ED Notes (Signed)
Awake. Verbally responsive. A/O x4. Resp even and unlabored. No audible adventitious breath sounds noted. ABC's intact. SR on monitor. IVs infusing medication as ordered. Pt tolerating well.

## 2014-08-12 NOTE — Consult Note (Signed)
Reason for Consult: GI bleeding Referring Physician: Hospital team  Logan Combs is an 64 y.o. male.  HPI: Patient seen and examined and case discussed with the ER physician and his hospital computer chart was reviewed and he does have a history of reflux and a previous colonoscopy with questionable polyps but he cannot remember when those were but believes they were at [facility] but he has not had any previous bleeding until 2 days ago when he started having black stools and today he's been throwing up bright red blood and did have a little bright red blood in his stool as well and he was started on Coumadin for portal vein thrombosis a week ago and he probably has hepatoma based on ultrasound and increased alpha-fetoprotein and he has a history of hepatitis C and HIV positivity and he has not been on any extra aspirin or non-steroidal's and has not had any other GI complaints and his case was briefly discussed with the hospital team as well  Past Medical History  Diagnosis Date  . HIV (human immunodeficiency virus infection)   . Hepatitis C     Past Surgical History  Procedure Laterality Date  . No past surgeries      History reviewed. No pertinent family history.  Social History:  reports that he quit smoking about 3 months ago. He has never used smokeless tobacco. He reports that he drinks about 13.2 oz of alcohol per week. He reports that he does not use illicit drugs.  Allergies:  Allergies  Allergen Reactions  . Penicillins Anaphylaxis  . Codeine Hives and Itching    Medications: I have reviewed the patient's current medications.  Results for orders placed or performed during the hospital encounter of 08/12/14 (from the past 48 hour(s))  POC occult blood, ED Provider will collect     Status: Abnormal   Collection Time: 08/12/14  4:10 PM  Result Value Ref Range   Fecal Occult Bld POSITIVE (A) NEGATIVE  I-stat chem 8, ed     Status: Abnormal   Collection Time: 08/12/14   4:26 PM  Result Value Ref Range   Sodium 136 135 - 145 mmol/L   Potassium 4.0 3.5 - 5.1 mmol/L   Chloride 110 101 - 111 mmol/L   BUN 24 (H) 6 - 20 mg/dL   Creatinine, Ser 1.40 (H) 0.61 - 1.24 mg/dL   Glucose, Bld 146 (H) 65 - 99 mg/dL   Calcium, Ion 1.06 (L) 1.13 - 1.30 mmol/L   TCO2 13 0 - 100 mmol/L   Hemoglobin 9.2 (L) 13.0 - 17.0 g/dL   HCT 27.0 (L) 39.0 - 52.0 %  CBC     Status: Abnormal   Collection Time: 08/12/14  4:27 PM  Result Value Ref Range   WBC 12.1 (H) 4.0 - 10.5 K/uL   RBC 2.84 (L) 4.22 - 5.81 MIL/uL   Hemoglobin 7.7 (L) 13.0 - 17.0 g/dL   HCT 22.9 (L) 39.0 - 52.0 %   MCV 80.6 78.0 - 100.0 fL   MCH 27.1 26.0 - 34.0 pg   MCHC 33.6 30.0 - 36.0 g/dL   RDW 19.0 (H) 11.5 - 15.5 %   Platelets 294 150 - 400 K/uL  Comprehensive metabolic panel     Status: Abnormal   Collection Time: 08/12/14  4:27 PM  Result Value Ref Range   Sodium 136 135 - 145 mmol/L   Potassium 3.7 3.5 - 5.1 mmol/L   Chloride 108 101 - 111 mmol/L  CO2 16 (L) 22 - 32 mmol/L   Glucose, Bld 153 (H) 65 - 99 mg/dL   BUN 24 (H) 6 - 20 mg/dL   Creatinine, Ser 1.59 (H) 0.61 - 1.24 mg/dL   Calcium 8.5 (L) 8.9 - 10.3 mg/dL   Total Protein 6.9 6.5 - 8.1 g/dL   Albumin 2.1 (L) 3.5 - 5.0 g/dL   AST 140 (H) 15 - 41 U/L   ALT 48 17 - 63 U/L   Alkaline Phosphatase 142 (H) 38 - 126 U/L   Total Bilirubin 1.3 (H) 0.3 - 1.2 mg/dL   GFR calc non Af Amer 45 (L) >60 mL/min   GFR calc Af Amer 52 (L) >60 mL/min    Comment: (NOTE) The eGFR has been calculated using the CKD EPI equation. This calculation has not been validated in all clinical situations. eGFR's persistently <60 mL/min signify possible Chronic Kidney Disease.    Anion gap 12 5 - 15  Lipase, blood     Status: None   Collection Time: 08/12/14  4:27 PM  Result Value Ref Range   Lipase 44 22 - 51 U/L  Protime-INR     Status: Abnormal   Collection Time: 08/12/14  4:27 PM  Result Value Ref Range   Prothrombin Time 30.5 (H) 11.6 - 15.2 seconds    INR 2.99 (H) 0.00 - 1.49  I-Stat CG4 Lactic Acid, ED     Status: Abnormal   Collection Time: 08/12/14  4:27 PM  Result Value Ref Range   Lactic Acid, Venous 8.39 (HH) 0.5 - 2.0 mmol/L   Comment NOTIFIED PHYSICIAN   Type and screen     Status: None (Preliminary result)   Collection Time: 08/12/14  4:28 PM  Result Value Ref Range   ABO/RH(D) A POS    Antibody Screen NEG    Sample Expiration 08/15/2014    Unit Number [id number]    Blood Component Type RED CELLS,LR    Unit division 00    Status of Unit ISSUED    Transfusion Status OK TO TRANSFUSE    Crossmatch Result Compatible    Unit Number [id number]    Blood Component Type RED CELLS,LR    Unit division 00    Status of Unit ALLOCATED    Transfusion Status OK TO TRANSFUSE    Crossmatch Result Compatible   ABO/Rh     Status: None   Collection Time: 08/12/14  4:30 PM  Result Value Ref Range   ABO/RH(D) A POS   Prepare RBC     Status: None   Collection Time: 08/12/14  5:30 PM  Result Value Ref Range   Order Confirmation ORDER PROCESSED BY BLOOD BANK   I-Stat CG4 Lactic Acid, ED     Status: Abnormal   Collection Time: 08/12/14  5:41 PM  Result Value Ref Range   Lactic Acid, Venous 8.34 (HH) 0.5 - 2.0 mmol/L   Comment NOTIFIED PHYSICIAN     No results found.  ROS negative except above he is in a neck collar and they are waiting on a neck CT Blood pressure 115/73, pulse 100, temperature 97.5 F (36.4 C), temperature source Oral, resp. rate 15, SpO2 99 %. Physical Exam vital signs stable afebrile currently no acute distress answers all questions sclera nonicteric decreased breath sounds bilaterally decreased heart sounds abdomen is soft nontender probably some ascites labs reviewed supposedly increased INR reversed by ER physician  Assessment/Plan: Upper GI bleeding in a patient on Coumadin with history of cirrhosis and portal  vein thrombosis Plan: Since he appears stable and is waiting on a neck CT I have gone ahead and  scheduled him for a endoscopy tomorrow at 12:30 which was discussed with the patient but please call me sooner if signs of active bleeding otherwise supportive care and ICU monitoring per hospital team and agree with pump inhibitors and octreotide in the meantime and holding anticoagulation with further workup and plans pending findings at endoscopy  [facility] E 08/12/2014, 7:41 PM

## 2014-08-12 NOTE — ED Notes (Signed)
Lab results given to B. Mingo Amber

## 2014-08-12 NOTE — ED Provider Notes (Signed)
CSN: [id number]     Arrival date & time 08/12/14  1537 History   First MD Initiated Contact with Patient 08/12/14 1546     Chief Complaint  Patient presents with  . Hematemesis  . Rectal Bleeding  . HIV Positive/AIDS     (Consider location/radiation/quality/duration/timing/severity/associated sxs/prior Treatment) HPI Comments: Acute onset bloody hematemesis today. Associated large volume black stools. Recently put on coumadin and lovenox for a portal vein thrombosis.  Was just admitted for HCAP recently.  Patient is a 64 y.o. male presenting with hematochezia and HIV/AIDS. The history is provided by the patient.  Rectal Bleeding Quality:  Black and tarry Amount:  Moderate Timing:  Constant Progression:  Unchanged Chronicity:  New Context: spontaneously   Context: not rectal pain   Similar prior episodes: no   Relieved by:  Nothing Worsened by:  Nothing tried Associated symptoms: no abdominal pain, no fever and no vomiting   Risk factors: anticoagulant use (just put on coumadin and lovenox for portal vein thrombosis) and liver disease (hepatic mass)   HIV Positive/AIDS Pertinent negatives include no chest pain, no abdominal pain and no shortness of breath.    Past Medical History  Diagnosis Date  . HIV (human immunodeficiency virus infection)   . Hepatitis C    Past Surgical History  Procedure Laterality Date  . No past surgeries     No family history on file. History  Substance Use Topics  . Smoking status: Former Smoker    Quit date: 04/27/2014  . Smokeless tobacco: Never Used  . Alcohol Use: 13.2 oz/week    21 Standard drinks or equivalent, 1 Glasses of wine per week    Review of Systems  Constitutional: Negative for fever.  Respiratory: Negative for cough and shortness of breath.   Cardiovascular: Negative for chest pain.  Gastrointestinal: Positive for hematochezia. Negative for vomiting and abdominal pain.  All other systems reviewed and are  negative.     Allergies  Penicillins and Codeine  Home Medications   Prior to Admission medications   Medication Sig Start Date End Date Taking? Authorizing Provider  dolutegravir (TIVICAY) 50 MG tablet Take 1 tablet (50 mg total) by mouth daily. 08/09/14   Michel Bickers, MD  emtricitabine-tenofovir (TRUVADA) 200-300 MG per tablet Take 1 tablet by mouth daily. 08/09/14   Michel Bickers, MD  enoxaparin (LOVENOX) 60 MG/0.6ML injection Inject 0.6 mLs (60 mg total) into the skin every 12 (twelve) hours. 08/09/14   Orson Eva, MD  furosemide (LASIX) 20 MG tablet Take 1 tablet (20 mg total) by mouth daily. 08/09/14   Orson Eva, MD  levofloxacin (LEVAQUIN) 750 MG tablet Take 1 tablet (750 mg total) by mouth daily. 08/09/14   Orson Eva, MD  spironolactone (ALDACTONE) 50 MG tablet Take 1 tablet (50 mg total) by mouth daily. 08/09/14   Orson Eva, MD  warfarin (COUMADIN) 2.5 MG tablet Take 1 tablet (2.5 mg total) by mouth daily. 08/09/14   Orson Eva, MD   BP 120/69 mmHg  Pulse 88  Temp(Src) 97.5 F (36.4 C) (Oral)  Resp 18  SpO2 100% Physical Exam  Constitutional: He is oriented to person, place, and time. He appears well-developed. No distress.  Cachectic  HENT:  Head: Normocephalic and atraumatic.  Mouth/Throat: No oropharyngeal exudate.  Eyes: EOM are normal. Pupils are equal, round, and reactive to light.  Neck: Normal range of motion. Neck supple.  Cardiovascular: Normal rate and regular rhythm.  Exam reveals no friction rub.   No murmur heard.  Pulmonary/Chest: Effort normal and breath sounds normal. No respiratory distress. He has no wheezes. He has no rales.  Abdominal: Soft. He exhibits no distension. There is no tenderness. There is no rebound.  Genitourinary: Guaiac positive stool (positive, black stool).  Musculoskeletal: Normal range of motion. He exhibits no edema.  Neurological: He is alert and oriented to person, place, and time.  Skin: No rash noted. He is not diaphoretic.  Nursing  note and vitals reviewed.   ED Course  Procedures (including critical care time) Labs Review Labs Reviewed  CBC  COMPREHENSIVE METABOLIC PANEL  LIPASE, BLOOD  PROTIME-INR  I-STAT CG4 LACTIC ACID, ED  I-STAT CHEM 8, ED  POC OCCULT BLOOD, ED  TYPE AND SCREEN    Imaging Review Ct Cervical Spine Wo Contrast  08/12/2014   CLINICAL DATA:  63 year old male with history of trauma from a fall this morning. Cervical spine clearance prior to ICU admission.  EXAM: CT CERVICAL SPINE WITHOUT CONTRAST  TECHNIQUE: Multidetector CT imaging of the cervical spine was performed without intravenous contrast. Multiplanar CT image reconstructions were also generated.  COMPARISON:  No priors.  FINDINGS: No acute displaced cervical spine fracture. Alignment is anatomic. Prevertebral soft tissues are normal. Multilevel degenerative disc disease, most severe at C6-C7. Mild multilevel facet arthropathy. Visualized portions of the upper thorax demonstrate extensive pleuroparenchymal thickening, most compatible with chronic post infectious or inflammatory scarring. Several small calcified granulomas are also incidentally noted.  IMPRESSION: 1. No evidence of significant acute traumatic injury to the cervical spine. 2. Mild multilevel degenerative disc disease and cervical spondylosis, as above.   Electronically Signed   By: Vinnie Langton M.D.   On: 08/12/2014 21:20     EKG Interpretation None     CRITICAL CARE Performed by: Osvaldo Shipper   Total critical care time: 60 minutes  Critical care time was exclusive of separately billable procedures and treating other patients.  Critical care was necessary to treat or prevent imminent or life-threatening deterioration.  Critical care was time spent personally by me on the following activities: development of treatment plan with patient and/or surrogate as well as nursing, discussions with consultants, evaluation of patient's response to treatment,  examination of patient, obtaining history from patient or surrogate, ordering and performing treatments and interventions, ordering and review of laboratory studies, ordering and review of radiographic studies, pulse oximetry and re-evaluation of patient's condition.  MDM   Final diagnoses:  Hematemesis with nausea  Upper GI bleed  Elevated lactic acid level  Acute posthemorrhagic anemia    64 year old male with history of hepatitis C, HIV, portal vein thrombosis on Coumadin with current Lovenox bridge presents with acute onset of bloody hematemesis and large volume black stools. Began today. Was just discharged from the hospital after a diagnosis of HCAP 3 days ago.  He's week here and appears ill. Vitals are stable. He does have black stool that is heme positive. Will check labs, hydrate, send type and screen and lactate. IV Protonix given. IV octreotide given. No history of esophageal varices, however he has hep C with cirrhotic features with recent paracentesis and portal vein thrombosis. He has risk factors for elevated portal pressures which could lead to new varices.   Hemoglobin 7.7, dropped to 3 g in 2 days. Pressures are remaining stable. Lactate 8.4. One hour after fluids, repeat lactate was normal. Spoke with critical care who will see patient but asked the hospital cement ICU [facility]. I spoke with Dr. Watt Climes with GI who will  see patient tonight. Admitted by Dr. Cloyde Reams, MD 08/12/14 [phone]

## 2014-08-12 NOTE — ED Notes (Signed)
Awake. Verbally responsive. A/O x4. Resp even and unlabored. No audible adventitious breath sounds noted. ABC's intact. SR on monitor. IVs patent and intact. Dry dsg to lt arm. Color pale.

## 2014-08-12 NOTE — ED Notes (Signed)
OSCAR RN AT BS. PT CONTINUES TO HAVE BLOODY EMESIS. CHARGE MACON RN MADE AWARE. PT PLACED IN RES A. REPORT GIVEN TO BETH RN AND BLANCH RN OSCAR RN ASSISTED. [place]

## 2014-08-12 NOTE — Consult Note (Signed)
PULMONARY / CRITICAL CARE MEDICINE   Name: Logan Combs MRN: [id number] DOB: 12/10/1950    ADMISSION DATE:  08/12/2014 CONSULTATION DATE:  08/12/2014  REFERRING MD :  EDP  CHIEF COMPLAINT:  Hematemesis  INITIAL PRESENTATION: 64 year old male with PMH of HIV, HCV and etoh abuse, liver failure, ascites who was recently admitted to [facility] with SOB and was noted to have a portal vein thrombosis.  He was discharged on coumadin.  Returns to the hospital with hematemesis and melena.  Lactic acid was noted to be 8.39 and Hg dropped from 10.2 3 days ago to 7.7. He was transfused and given volume with minimal improvement. PCCM to see.   STUDIES:    SIGNIFICANT EVENTS: 7/5-7/8 > admission for CAP, cirrhosis, portal vein thrombosis.    HISTORY OF PRESENT ILLNESS:  64 year old male with PMH as below, which includes HIV/AIDS, ETOH abuse, and Hepatitis C. He was recently admitted 7/5 for CAP, portal vein thrombosis,  and decompensated cirrhosis. Also found to have liver mass, which was suspected to be hepatoma given his presentation and alpha-fetoprotein level of > 8000. He had paracentesis that removed 3.5 liters. SBP was considered. He was treated with levaquin for CAP. He was discharged on warfarin with lovenox bridge for portal vein thrombosis. Currently holding HIV meds until follows up with Dr. Megan Salon.  He was discharged 7/8.   7/11 he again presented to [facility] ED with complaints of acute onset hematemesis and large volume black stools in setting of recently starting warfarin/lovenox for portal vein thrombus.  Vital signs were stable, however hgb was decreased at 7.7. Lactic acid noted to be 8.3. Which did not clear with volume and blood transfusion. He was also coagulopathic with INR 2.99. This was reversed with vitamin K and Kcentra. He was started on protonix and octreotide infusions and PCCM was called for admission.   PAST MEDICAL HISTORY :   has a past medical history of HIV (human  immunodeficiency virus infection) and Hepatitis C.  has past surgical history that includes No past surgeries. Prior to Admission medications   Medication Sig Start Date End Date Taking? Authorizing Provider  dolutegravir (TIVICAY) 50 MG tablet Take 1 tablet (50 mg total) by mouth daily. 08/09/14  Yes Michel Bickers, MD  emtricitabine-tenofovir (TRUVADA) 200-300 MG per tablet Take 1 tablet by mouth daily. 08/09/14  Yes Michel Bickers, MD  enoxaparin (LOVENOX) 60 MG/0.6ML injection Inject 0.6 mLs (60 mg total) into the skin every 12 (twelve) hours. 08/09/14  Yes Orson Eva, MD  furosemide (LASIX) 20 MG tablet Take 1 tablet (20 mg total) by mouth daily. 08/09/14  Yes Orson Eva, MD  levofloxacin (LEVAQUIN) 750 MG tablet Take 1 tablet (750 mg total) by mouth daily. 08/09/14  Yes Orson Eva, MD  Omega-3 Fatty Acids (OMEGA 3 PO) Take 2 capsules by mouth daily.   Yes Historical Provider, MD  Probiotic Product (PROBIOTIC DAILY PO) Take 1 tablet by mouth daily.   Yes Historical Provider, MD  spironolactone (ALDACTONE) 50 MG tablet Take 1 tablet (50 mg total) by mouth daily. 08/09/14  Yes Orson Eva, MD  warfarin (COUMADIN) 2.5 MG tablet Take 1 tablet (2.5 mg total) by mouth daily. 08/09/14  Yes Orson Eva, MD   Allergies  Allergen Reactions  . Penicillins Anaphylaxis  . Codeine Hives and Itching    FAMILY HISTORY:  has no family status information on file.  SOCIAL HISTORY:  reports that he quit smoking about 3 months ago. He has  never used smokeless tobacco. He reports that he drinks about 13.2 oz of alcohol per week. He reports that he does not use illicit drugs.  REVIEW OF SYSTEMS:  12 point ROS is positive only for abdominal pain and pain from the c-collar.  SUBJECTIVE: c/o abdominal pain.  VITAL SIGNS: Temp:  [97.5 F (36.4 C)] 97.5 F (36.4 C) (07/11 1815) Pulse Rate:  [85-92] 92 (07/11 1800) Resp:  [17-21] 17 (07/11 1800) BP: (118-131)/(67-79) 122/71 mmHg (07/11 1800) SpO2:  [96 %-100 %] 100 % (07/11  1800) HEMODYNAMICS:   VENTILATOR SETTINGS:   INTAKE / OUTPUT: No intake or output data in the 24 hours ending 08/12/14 1819  PHYSICAL EXAMINATION: General:  Chronically ill appearing male, emaciated, in moderate discomfort from epigastrium. Neuro:  Alert and interactive, moving all ext to command, neck is not painful to movement inside the collar, patient is moving it spontaneously, asked not ot. HEENT:  [place]/AT, PERRL, EOM-I and MMM. Cardiovascular:  RRR, Nl S1/S2, -M/R/G. Lungs:  CTA bilaterally. Abdomen:  Soft, epigastric tenderness, mild distention and MMM. Musculoskeletal:  No edema and no tenderness.. Skin:  Intact.  LABS:  CBC  Recent Labs Lab 08/07/14 0513 08/08/14 0430 08/12/14 1626 08/12/14 1627  WBC 6.0 6.0  --  12.1*  HGB 10.9* 10.8* 9.2* 7.7*  HCT 32.5* 32.5* 27.0* 22.9*  PLT 331 348  --  294   Coag's  Recent Labs Lab 08/08/14 1239 08/09/14 0538 08/12/14 1627  APTT 43*  --   --   INR 1.36 1.32 2.99*   BMET  Recent Labs Lab 08/08/14 0430 08/09/14 0538 08/12/14 1626 08/12/14 1627  NA 134* 132* 136 136  K 3.5 3.3* 4.0 3.7  CL 104 105 110 108  CO2 22 20*  --  16*  BUN 8 10 24* 24*  CREATININE 0.95 1.14 1.40* 1.59*  GLUCOSE 100* 98 146* 153*   Electrolytes  Recent Labs Lab 08/08/14 0430 08/09/14 0538 08/12/14 1627  CALCIUM 8.6* 8.8* 8.5*   Sepsis Markers  Recent Labs Lab 08/05/14 2346 08/12/14 1627 08/12/14 1741  LATICACIDVEN 1.47 8.39* 8.34*   ABG No results for input(s): PHART, PCO2ART, PO2ART in the last 168 hours. Liver Enzymes  Recent Labs Lab 08/05/14 1938 08/07/14 0513 08/12/14 1627  AST 81* 80* 140*  ALT 35 35 48  ALKPHOS 172* 149* 142*  BILITOT 1.3* 1.3* 1.3*  ALBUMIN 2.2* 2.0* 2.1*   Cardiac Enzymes No results for input(s): TROPONINI, PROBNP in the last 168 hours. Glucose No results for input(s): GLUCAP in the last 168 hours.  Imaging No results found.   ASSESSMENT / PLAN:  PULMONARY A: Recent  CAP  P:   Supplemental O2 as needed to keep SpO2 > 92% See ID Titrate O2 for sat of 88-92%. Will f/u post procedure incase patient is left intubated post procedure.  CARDIOVASCULAR A:  Hypotension in setting GI bleed (improved with volume) Elevated lactic acid  P:  Aggressive IVF resuscitation Transfuse as indicated Telemetry monitoring  Repeat lactic acid. Check cortisol level.  RENAL A:   Acute kidney injury, suspect prerenal in setting hypotension  P:   Follow Bmet Replace electrolytes as indicated. Hydrate.  GASTROINTESTINAL A:   Upper GI bleed suspicious for varices due to below Alcoholic cirrhosis Portal vein thrombosis Liver mass, hepatoma suspected last admit. Consistent with atypical hepatocellular carcinoma per CT (not candidate for resection/ablation) from last admission  P:   Hold lasix/spironolactone NPO GI to see/scope Protonix, octreotide gtt's. Monitor for signs of bleeding.  HEMATOLOGIC A:   Acute blood loss anemia  P:  Hold coumadin/lovenox Kcentra, Vit K given in ED Transfuse for Hgb < 7  Or for active bleeding. Will need to address needs for anti-coagulation pending findings on EGD. Monitor in the ICU for evidence of bleeding. H&H.  INFECTIOUS A:   Recent CAP HIV/AIDS  P:   Levaquin from prior admit. Last day 7/12 Follow WBC and fever curve Followed by Dr. Megan Salon ID Has been holding ART medication until ID follow up.  Will likely need ID to see in AM for HIV medications and appropriateness of starting medications.  ENDOCRINE A:   No acute issues  P:   Monitor  NEUROLOGIC A:   No acute issues  P:   Monitor    FAMILY  - Updates:   - Inter-disciplinary family meet or Palliative Care meeting due by:  08/19/2014  Georgann Housekeeper, AGACNP-BC [facility]/Critical [facility] Pager [phone] or [phone]  Above note edited in full.  Will admit to the ICU for observation, TRH to admit and PCCM as consult.   Will need to f/u post EGD in case patient is left intubated.  H&H as ordered.  Transfusion as ordered.  Will order a neck CT for clearance.  Neck exam is clean.  If neck CT is negative will d/c.  Cortisol level ordered for shock.  If develops further hypotension then will need TLC placement and pressors started.  Not necessary for now.  The patient is critically ill with multiple organ systems failure and requires high complexity decision making for assessment and support, frequent evaluation and titration of therapies, application of advanced monitoring technologies and extensive interpretation of multiple databases.   Critical Care Time devoted to patient care services described in this note is  35  Minutes. This time reflects time of care of this signee Dr Jennet Maduro. This critical care time does not reflect procedure time, or teaching time or supervisory time of PA/NP/Med student/Med Resident etc but could involve care discussion time.  Rush Farmer, M.D. [facility] Pulmonary/Critical Care Medicine. Pager: [phone]. After hours pager: [phone].  08/12/2014 6:51 PM

## 2014-08-12 NOTE — Progress Notes (Signed)
ED CM noted CM consult to assist pt with medications This pt is NOT eligible for medication assistance  Previous CM notes from 08/09/14 Pt received MATCH assistance   Prior to his recent [facility] d/c he was seen by CM & [place] Cm for follow-up appointment at [facility] and [facility]. Pt uninsured.  Patient was entered into [facility] program so he could obtain Tivicay and Truvada. She indicates patient set-up with [facility]. She indicates patient to discharge on Coumadin and will need hospital follow-up appointment and PT/INR on 08/12/14.  Neoma Laming indicates she spoke with Hospitalist who she indicated stated patient okay to have appointment and PT/INR on 08/13/14. Hospital follow-up appointment obtained on 08/13/14 at 1100 with Zettie Pho, PA. Appointment time placed on patient's AVS.  This ED CM contacted [place] CM, Opal Sidles to updated her on pt [facility] ED visit for 08/12/14 with possible admission, will most likely miss 08/13/14 appointment. Jane received update and will follow pt for needs/rescheduled of [facility] appt

## 2014-08-13 ENCOUNTER — Inpatient Hospital Stay (HOSPITAL_COMMUNITY): Payer: Self-pay

## 2014-08-13 ENCOUNTER — Inpatient Hospital Stay: Payer: 59

## 2014-08-13 ENCOUNTER — Telehealth: Payer: Self-pay

## 2014-08-13 ENCOUNTER — Encounter (HOSPITAL_COMMUNITY)
Admission: EM | Disposition: A | Discharge status: Skilled Nursing Facility | Payer: Self-pay | Source: Home / Self Care | Attending: Internal Medicine

## 2014-08-13 ENCOUNTER — Inpatient Hospital Stay (HOSPITAL_COMMUNITY): Payer: Self-pay | Admitting: Anesthesiology

## 2014-08-13 ENCOUNTER — Encounter (HOSPITAL_COMMUNITY): Payer: Self-pay

## 2014-08-13 DIAGNOSIS — C220 Liver cell carcinoma: Secondary | ICD-10-CM | POA: Diagnosis present

## 2014-08-13 DIAGNOSIS — I10 Essential (primary) hypertension: Secondary | ICD-10-CM | POA: Diagnosis present

## 2014-08-13 DIAGNOSIS — I8510 Secondary esophageal varices without bleeding: Secondary | ICD-10-CM

## 2014-08-13 DIAGNOSIS — B20 Human immunodeficiency virus [HIV] disease: Secondary | ICD-10-CM

## 2014-08-13 DIAGNOSIS — K7460 Unspecified cirrhosis of liver: Secondary | ICD-10-CM

## 2014-08-13 DIAGNOSIS — I81 Portal vein thrombosis: Secondary | ICD-10-CM

## 2014-08-13 DIAGNOSIS — D72829 Elevated white blood cell count, unspecified: Secondary | ICD-10-CM | POA: Diagnosis present

## 2014-08-13 DIAGNOSIS — D62 Acute posthemorrhagic anemia: Secondary | ICD-10-CM | POA: Insufficient documentation

## 2014-08-13 DIAGNOSIS — E43 Unspecified severe protein-calorie malnutrition: Secondary | ICD-10-CM | POA: Diagnosis present

## 2014-08-13 DIAGNOSIS — B1920 Unspecified viral hepatitis C without hepatic coma: Secondary | ICD-10-CM

## 2014-08-13 DIAGNOSIS — N179 Acute kidney failure, unspecified: Secondary | ICD-10-CM | POA: Diagnosis present

## 2014-08-13 DIAGNOSIS — K920 Hematemesis: Secondary | ICD-10-CM | POA: Insufficient documentation

## 2014-08-13 DIAGNOSIS — J9601 Acute respiratory failure with hypoxia: Secondary | ICD-10-CM

## 2014-08-13 DIAGNOSIS — R160 Hepatomegaly, not elsewhere classified: Secondary | ICD-10-CM

## 2014-08-13 DIAGNOSIS — Z978 Presence of other specified devices: Secondary | ICD-10-CM | POA: Insufficient documentation

## 2014-08-13 DIAGNOSIS — Z21 Asymptomatic human immunodeficiency virus [HIV] infection status: Secondary | ICD-10-CM

## 2014-08-13 DIAGNOSIS — D638 Anemia in other chronic diseases classified elsewhere: Secondary | ICD-10-CM | POA: Diagnosis present

## 2014-08-13 DIAGNOSIS — Z9911 Dependence on respirator [ventilator] status: Secondary | ICD-10-CM

## 2014-08-13 DIAGNOSIS — E46 Unspecified protein-calorie malnutrition: Secondary | ICD-10-CM

## 2014-08-13 DIAGNOSIS — F1010 Alcohol abuse, uncomplicated: Secondary | ICD-10-CM

## 2014-08-13 HISTORY — PX: ESOPHAGOGASTRODUODENOSCOPY: SHX5428

## 2014-08-13 LAB — COMPREHENSIVE METABOLIC PANEL
ALK PHOS: 189 U/L — AB (ref 38–126)
ALT: 290 U/L — AB (ref 17–63)
ALT: 317 U/L — ABNORMAL HIGH (ref 17–63)
ANION GAP: 7 (ref 5–15)
AST: 805 U/L — AB (ref 15–41)
AST: 884 U/L — ABNORMAL HIGH (ref 15–41)
Albumin: 2.1 g/dL — ABNORMAL LOW (ref 3.5–5.0)
Albumin: 1.7 g/dL — ABNORMAL LOW (ref 3.5–5.0)
Alkaline Phosphatase: 196 U/L — ABNORMAL HIGH (ref 38–126)
Anion gap: 7 (ref 5–15)
BUN: 35 mg/dL — ABNORMAL HIGH (ref 6–20)
BUN: 45 mg/dL — AB (ref 6–20)
CALCIUM: 7.8 mg/dL — AB (ref 8.9–10.3)
CHLORIDE: 110 mmol/L (ref 101–111)
CHLORIDE: 114 mmol/L — AB (ref 101–111)
CO2: 19 mmol/L — ABNORMAL LOW (ref 22–32)
CO2: 16 mmol/L — AB (ref 22–32)
CREATININE: 1.74 mg/dL — AB (ref 0.61–1.24)
Calcium: 6.5 mg/dL — ABNORMAL LOW (ref 8.9–10.3)
Creatinine, Ser: 1.91 mg/dL — ABNORMAL HIGH (ref 0.61–1.24)
GFR calc Af Amer: 46 mL/min — ABNORMAL LOW (ref >60)
GFR calc Af Amer: 41 mL/min — ABNORMAL LOW (ref >60)
GFR calc non Af Amer: 40 mL/min — ABNORMAL LOW (ref >60)
GFR calc non Af Amer: 36 mL/min — ABNORMAL LOW (ref >60)
GLUCOSE: 149 mg/dL — AB (ref 65–99)
Glucose, Bld: 200 mg/dL — ABNORMAL HIGH (ref 65–99)
POTASSIUM: 4.2 mmol/L (ref 3.5–5.1)
Potassium: 3.8 mmol/L (ref 3.5–5.1)
Sodium: 136 mmol/L (ref 135–145)
Sodium: 137 mmol/L (ref 135–145)
Total Bilirubin: 2.3 mg/dL — ABNORMAL HIGH (ref 0.3–1.2)
Total Bilirubin: 2.2 mg/dL — ABNORMAL HIGH (ref 0.3–1.2)
Total Protein: 6.3 g/dL — ABNORMAL LOW (ref 6.5–8.1)
Total Protein: 4.6 g/dL — ABNORMAL LOW (ref 6.5–8.1)

## 2014-08-13 LAB — BLOOD GAS, ARTERIAL
ACID-BASE DEFICIT: 13.4 mmol/L — AB (ref 0.0–2.0)
Acid-base deficit: 10.1 mmol/L — ABNORMAL HIGH (ref 0.0–2.0)
Bicarbonate: 13 mEq/L — ABNORMAL LOW (ref 20.0–24.0)
Bicarbonate: 15.4 mEq/L — ABNORMAL LOW (ref 20.0–24.0)
DRAWN BY: 441371
Drawn by: 345601
FIO2: 0.4 %
FIO2: 0.4 %
LHR: 12 {breaths}/min
MECHVT: 600 mL
O2 SAT: 96.8 %
O2 Saturation: 98.4 %
PATIENT TEMPERATURE: 35.3
PCO2 ART: 30.6 mmHg — AB (ref 35.0–45.0)
PEEP/CPAP: 5 cmH2O
PEEP: 5 cmH2O
PH ART: 7.29 — AB (ref 7.350–7.450)
Patient temperature: 97
RATE: 12 resp/min
TCO2: 12.3 mmol/L (ref 0–100)
TCO2: 14.5 mmol/L (ref 0–100)
VT: 600 mL
pCO2 arterial: 32.6 mmHg — ABNORMAL LOW (ref 35.0–45.0)
pH, Arterial: 7.242 — ABNORMAL LOW (ref 7.350–7.450)
pO2, Arterial: 88.5 mmHg (ref 80.0–100.0)
pO2, Arterial: 124 mmHg — ABNORMAL HIGH (ref 80.0–100.0)

## 2014-08-13 LAB — CBC
HCT: 30.2 % — ABNORMAL LOW (ref 39.0–52.0)
HCT: 29 % — ABNORMAL LOW (ref 39.0–52.0)
HCT: 29.1 % — ABNORMAL LOW (ref 39.0–52.0)
Hemoglobin: 10.1 g/dL — ABNORMAL LOW (ref 13.0–17.0)
Hemoglobin: 9.9 g/dL — ABNORMAL LOW (ref 13.0–17.0)
Hemoglobin: 9.9 g/dL — ABNORMAL LOW (ref 13.0–17.0)
MCH: 26.9 pg (ref 26.0–34.0)
MCH: 26.5 pg (ref 26.0–34.0)
MCH: 28.9 pg (ref 26.0–34.0)
MCHC: 33.4 g/dL (ref 30.0–36.0)
MCHC: 34.1 g/dL (ref 30.0–36.0)
MCHC: 34 g/dL (ref 30.0–36.0)
MCV: 80.5 fL (ref 78.0–100.0)
MCV: 77.7 fL — AB (ref 78.0–100.0)
MCV: 85.1 fL (ref 78.0–100.0)
PLATELETS: 239 10*3/uL (ref 150–400)
Platelets: 257 10*3/uL (ref 150–400)
Platelets: 110 10*3/uL — ABNORMAL LOW (ref 150–400)
RBC: 3.75 MIL/uL — ABNORMAL LOW (ref 4.22–5.81)
RBC: 3.73 MIL/uL — AB (ref 4.22–5.81)
RBC: 3.42 MIL/uL — ABNORMAL LOW (ref 4.22–5.81)
RDW: 17.1 % — AB (ref 11.5–15.5)
RDW: 17.4 % — AB (ref 11.5–15.5)
RDW: 16.9 % — AB (ref 11.5–15.5)
WBC: 13.1 10*3/uL — AB (ref 4.0–10.5)
WBC: 14.9 10*3/uL — AB (ref 4.0–10.5)
WBC: 14.2 10*3/uL — AB (ref 4.0–10.5)

## 2014-08-13 LAB — RAPID URINE DRUG SCREEN, HOSP PERFORMED
AMPHETAMINES: NOT DETECTED
Barbiturates: NOT DETECTED
Benzodiazepines: NOT DETECTED
Cocaine: NOT DETECTED
Opiates: POSITIVE — AB
Tetrahydrocannabinol: NOT DETECTED

## 2014-08-13 LAB — GLUCOSE, CAPILLARY
GLUCOSE-CAPILLARY: 149 mg/dL — AB (ref 65–99)
Glucose-Capillary: 173 mg/dL — ABNORMAL HIGH (ref 65–99)
Glucose-Capillary: 211 mg/dL — ABNORMAL HIGH (ref 65–99)

## 2014-08-13 LAB — HEMOGLOBIN AND HEMATOCRIT, BLOOD
HCT: 30.2 % — ABNORMAL LOW (ref 39.0–52.0)
Hemoglobin: 10.6 g/dL — ABNORMAL LOW (ref 13.0–17.0)

## 2014-08-13 LAB — MAGNESIUM: MAGNESIUM: 2.2 mg/dL (ref 1.7–2.4)

## 2014-08-13 LAB — ETHANOL: Alcohol, Ethyl (B): <5 mg/dL (ref <5)

## 2014-08-13 LAB — PHOSPHORUS: PHOSPHORUS: 5.8 mg/dL — AB (ref 2.5–4.6)

## 2014-08-13 LAB — PROTIME-INR
INR: 1.41 (ref 0.00–1.49)
INR: 1.53 — ABNORMAL HIGH (ref 0.00–1.49)
Prothrombin Time: 17.3 seconds — ABNORMAL HIGH (ref 11.6–15.2)
Prothrombin Time: 18.5 seconds — ABNORMAL HIGH (ref 11.6–15.2)

## 2014-08-13 LAB — PREPARE RBC (CROSSMATCH)

## 2014-08-13 LAB — MRSA PCR SCREENING: MRSA BY PCR: NEGATIVE

## 2014-08-13 LAB — LACTIC ACID, PLASMA: Lactic Acid, Venous: 1.9 mmol/L (ref 0.5–2.0)

## 2014-08-13 LAB — TROPONIN I: Troponin I: 7.43 ng/mL (ref <0.031)

## 2014-08-13 SURGERY — EGD (ESOPHAGOGASTRODUODENOSCOPY)
Anesthesia: General

## 2014-08-13 MED ORDER — SODIUM CHLORIDE 0.9 % IV SOLN: INTRAVENOUS | Status: DC

## 2014-08-13 MED ORDER — IPRATROPIUM BROMIDE 0.02 % IN SOLN: 0.5000 mg | RESPIRATORY_TRACT | Status: DC | PRN

## 2014-08-13 MED ORDER — PROPOFOL 10 MG/ML IV BOLUS
INTRAVENOUS | Status: AC
  Filled 2014-08-13: qty 20

## 2014-08-13 MED ORDER — INSULIN ASPART 100 UNIT/ML ~~LOC~~ SOLN
0.0000 [IU] | SUBCUTANEOUS | Status: DC
  Administered 2014-08-13: 3 [IU] via SUBCUTANEOUS
  Administered 2014-08-14 – 2014-08-15 (×9): 2 [IU] via SUBCUTANEOUS
  Administered 2014-08-16 (×2): 3 [IU] via SUBCUTANEOUS
  Administered 2014-08-17: 2 [IU] via SUBCUTANEOUS
  Administered 2014-08-17: 3 [IU] via SUBCUTANEOUS
  Administered 2014-08-17 – 2014-08-19 (×6): 2 [IU] via SUBCUTANEOUS

## 2014-08-13 MED ORDER — FENTANYL CITRATE (PF) 100 MCG/2ML IJ SOLN: 25.0000 ug | INTRAMUSCULAR | Status: DC | PRN

## 2014-08-13 MED ORDER — DEXMEDETOMIDINE HCL IN NACL 200 MCG/50ML IV SOLN
0.0000 ug/kg/h | INTRAVENOUS | Status: DC
  Administered 2014-08-13: 0.1 ug/kg/h via INTRAVENOUS
  Administered 2014-08-14: 0.2 ug/kg/h via INTRAVENOUS
  Administered 2014-08-14: 0.9 ug/kg/h via INTRAVENOUS
  Administered 2014-08-14: 0.6 ug/kg/h via INTRAVENOUS
  Administered 2014-08-14 – 2014-08-15 (×3): 0.9 ug/kg/h via INTRAVENOUS
  Filled 2014-08-13 (×8): qty 50

## 2014-08-13 MED ORDER — SODIUM CHLORIDE 0.9 % IJ SOLN
INTRAMUSCULAR | Status: DC | PRN
  Administered 2014-08-13: 3 mL
  Administered 2014-08-13: 4 mL
  Administered 2014-08-13: 3 mL
  Administered 2014-08-13: 5.5 mL
  Administered 2014-08-13: 3 mL

## 2014-08-13 MED ORDER — FENTANYL CITRATE (PF) 100 MCG/2ML IJ SOLN
100.0000 ug | INTRAMUSCULAR | Status: DC | PRN
Start: 2014-08-13 — End: 2014-08-17
  Administered 2014-08-14 – 2014-08-17 (×21): 100 ug via INTRAVENOUS
  Filled 2014-08-13 (×16): qty 2

## 2014-08-13 MED ORDER — GLUCAGON HCL RDNA (DIAGNOSTIC) 1 MG IJ SOLR
INTRAMUSCULAR | Status: AC
  Filled 2014-08-13: qty 1

## 2014-08-13 MED ORDER — PROPOFOL 10 MG/ML IV BOLUS
INTRAVENOUS | Status: DC | PRN
  Administered 2014-08-13: 70 mg via INTRAVENOUS

## 2014-08-13 MED ORDER — STERILE WATER FOR INJECTION IV SOLN
INTRAVENOUS | Status: DC
  Administered 2014-08-13 – 2014-08-15 (×4): via INTRAVENOUS
  Filled 2014-08-13 (×7): qty 850

## 2014-08-13 MED ORDER — CIPROFLOXACIN IN D5W 400 MG/200ML IV SOLN
400.0000 mg | Freq: Two times a day (BID) | INTRAVENOUS | Status: DC
  Administered 2014-08-13: 400 mg via INTRAVENOUS
  Filled 2014-08-13: qty 200

## 2014-08-13 MED ORDER — FENTANYL CITRATE (PF) 100 MCG/2ML IJ SOLN
INTRAMUSCULAR | Status: DC | PRN
  Administered 2014-08-13: 25 ug via INTRAVENOUS
  Administered 2014-08-13: 50 ug via INTRAVENOUS
  Administered 2014-08-13: 25 ug via INTRAVENOUS

## 2014-08-13 MED ORDER — CETYLPYRIDINIUM CHLORIDE 0.05 % MT LIQD
7.0000 mL | Freq: Four times a day (QID) | OROMUCOSAL | Status: DC
  Administered 2014-08-14 – 2014-08-18 (×16): 7 mL via OROMUCOSAL

## 2014-08-13 MED ORDER — EPINEPHRINE HCL 0.1 MG/ML IJ SOSY
PREFILLED_SYRINGE | INTRAMUSCULAR | Status: AC
  Filled 2014-08-13: qty 10

## 2014-08-13 MED ORDER — MEPERIDINE HCL 25 MG/ML IJ SOLN: 6.2500 mg | INTRAMUSCULAR | Status: DC | PRN

## 2014-08-13 MED ORDER — ETHANOLAMINE OLEATE 5 % IV SOLN
INTRAVENOUS | Status: AC
  Filled 2014-08-13: qty 2

## 2014-08-13 MED ORDER — SODIUM CHLORIDE 0.9 % IV BOLUS (SEPSIS)
1000.0000 mL | Freq: Once | INTRAVENOUS | Status: AC
  Administered 2014-08-13: 1000 mL via INTRAVENOUS

## 2014-08-13 MED ORDER — ALBUTEROL SULFATE (2.5 MG/3ML) 0.083% IN NEBU: 2.5000 mg | INHALATION_SOLUTION | RESPIRATORY_TRACT | Status: DC | PRN

## 2014-08-13 MED ORDER — LACTATED RINGERS IV SOLN
INTRAVENOUS | Status: DC
Start: 2014-08-13 — End: 2014-08-13
  Administered 2014-08-13: 1000 mL via INTRAVENOUS
  Administered 2014-08-13: 15:00:00 via INTRAVENOUS

## 2014-08-13 MED ORDER — PROMETHAZINE HCL 25 MG/ML IJ SOLN
6.2500 mg | INTRAMUSCULAR | Status: DC | PRN
Start: 2014-08-13 — End: 2014-08-13

## 2014-08-13 MED ORDER — EMTRICITABINE-TENOFOVIR DF 200-300 MG PO TABS
1.0000 | ORAL_TABLET | ORAL | Status: DC
  Filled 2014-08-13: qty 1

## 2014-08-13 MED ORDER — DOLUTEGRAVIR SODIUM 50 MG PO TABS
50.0000 mg | ORAL_TABLET | Freq: Every day | ORAL | Status: DC
  Administered 2014-08-17 – 2014-08-23 (×7): 50 mg via ORAL
  Filled 2014-08-13 (×12): qty 1

## 2014-08-13 MED ORDER — MORPHINE SULFATE 2 MG/ML IJ SOLN
2.0000 mg | Freq: Once | INTRAMUSCULAR | Status: AC
  Administered 2014-08-13: 2 mg via INTRAVENOUS
  Filled 2014-08-13: qty 1

## 2014-08-13 MED ORDER — PROMETHAZINE HCL 25 MG/ML IJ SOLN: 6.2500 mg | INTRAMUSCULAR | Status: DC | PRN

## 2014-08-13 MED ORDER — LACTATED RINGERS IV SOLN: INTRAVENOUS | Status: DC

## 2014-08-13 MED ORDER — PHENYLEPHRINE 40 MCG/ML (10ML) SYRINGE FOR IV PUSH (FOR BLOOD PRESSURE SUPPORT)
PREFILLED_SYRINGE | INTRAVENOUS | Status: AC
  Filled 2014-08-13: qty 10

## 2014-08-13 MED ORDER — DEXTROSE-NACL 5-0.45 % IV SOLN
INTRAVENOUS | Status: DC
  Administered 2014-08-13: 18:00:00 via INTRAVENOUS

## 2014-08-13 MED ORDER — LIDOCAINE HCL (CARDIAC) 20 MG/ML IV SOLN
INTRAVENOUS | Status: DC | PRN
  Administered 2014-08-13: 75 mg via INTRAVENOUS

## 2014-08-13 MED ORDER — VASOPRESSIN 20 UNIT/ML IV SOLN
0.0300 [IU]/min | INTRAVENOUS | Status: DC
  Administered 2014-08-14 (×2): 0.03 [IU]/min via INTRAVENOUS
  Filled 2014-08-13 (×2): qty 2

## 2014-08-13 MED ORDER — FENTANYL CITRATE (PF) 100 MCG/2ML IJ SOLN
100.0000 ug | INTRAMUSCULAR | Status: DC | PRN
  Administered 2014-08-13 (×2): 100 ug via INTRAVENOUS
  Filled 2014-08-13 (×7): qty 2

## 2014-08-13 MED ORDER — NOREPINEPHRINE BITARTRATE 1 MG/ML IV SOLN
0.0000 ug/min | INTRAVENOUS | Status: DC
  Administered 2014-08-13: 20 ug/min via INTRAVENOUS
  Administered 2014-08-14: 10 ug/min via INTRAVENOUS
  Administered 2014-08-15: 25 ug/min via INTRAVENOUS
  Administered 2014-08-16: 17 ug/min via INTRAVENOUS
  Filled 2014-08-13 (×4): qty 16

## 2014-08-13 MED ORDER — EMTRICITABINE-TENOFOVIR DF 200-300 MG PO TABS
1.0000 | ORAL_TABLET | ORAL | Status: DC
  Administered 2014-08-17 – 2014-08-23 (×4): 1
  Filled 2014-08-13 (×5): qty 1

## 2014-08-13 MED ORDER — CHLORHEXIDINE GLUCONATE 0.12 % MT SOLN
15.0000 mL | Freq: Two times a day (BID) | OROMUCOSAL | Status: DC
  Administered 2014-08-13 – 2014-08-19 (×12): 15 mL via OROMUCOSAL
  Filled 2014-08-13 (×12): qty 15

## 2014-08-13 MED ORDER — LACTATED RINGERS IV SOLN
INTRAVENOUS | Status: DC | PRN
  Administered 2014-08-13: 14:00:00 via INTRAVENOUS

## 2014-08-13 MED ORDER — CISATRACURIUM BESYLATE (PF) 10 MG/5ML IV SOLN
INTRAVENOUS | Status: DC | PRN
  Administered 2014-08-13: 20 mg via INTRAVENOUS

## 2014-08-13 MED ORDER — LIDOCAINE HCL (CARDIAC) 20 MG/ML IV SOLN
INTRAVENOUS | Status: AC
  Filled 2014-08-13: qty 5

## 2014-08-13 MED ORDER — SUCCINYLCHOLINE CHLORIDE 20 MG/ML IJ SOLN
INTRAMUSCULAR | Status: DC | PRN
  Administered 2014-08-13: 80 mg via INTRAVENOUS

## 2014-08-13 MED ORDER — EMTRICITABINE-TENOFOVIR DF 200-300 MG PO TABS: 1.0000 | ORAL_TABLET | Freq: Every day | ORAL | Status: DC

## 2014-08-13 MED ORDER — PHENYLEPHRINE HCL 10 MG/ML IJ SOLN
INTRAMUSCULAR | Status: DC | PRN
  Administered 2014-08-13: 120 ug via INTRAVENOUS

## 2014-08-13 MED ORDER — CIPROFLOXACIN IN D5W 400 MG/200ML IV SOLN: 400.0000 mg | Freq: Once | INTRAVENOUS | Status: DC

## 2014-08-13 MED ORDER — SODIUM CHLORIDE 0.9 % IV BOLUS (SEPSIS)
2000.0000 mL | Freq: Once | INTRAVENOUS | Status: AC
  Administered 2014-08-13: 2000 mL via INTRAVENOUS

## 2014-08-13 MED ORDER — SODIUM CHLORIDE 0.9 % IV SOLN
INTRAVENOUS | Status: DC | PRN
  Administered 2014-08-13: 14:00:00 via INTRAVENOUS

## 2014-08-13 MED ORDER — FENTANYL CITRATE (PF) 100 MCG/2ML IJ SOLN
INTRAMUSCULAR | Status: AC
  Filled 2014-08-13: qty 2

## 2014-08-13 MED ORDER — SODIUM CHLORIDE 0.9 % IV SOLN
INTRAVENOUS | Status: DC
  Administered 2014-08-13: 1000 mL via INTRAVENOUS

## 2014-08-13 NOTE — Progress Notes (Addendum)
Patient ID: Logan Combs, male   DOB: [birth year], 64 y.o.   MRN: [id number] [facility] PRO[facility]ESS NOTE  RANDOM DOBROWSKI XBL:[id number] DOB: [birth year] DOA: 08/12/2014 PCP: [name], MD  Brief narrative:    64 y.o. male with past medical history of liver cirrhosis, alcohol abuse, hepatitis C, HIV on anti-retroviral therapy (last CD4 in 03/2014 670), recent hospitalization from 08/05/2014 through 08/09/2014 for decompensated liver cirrhosis with ascites, pneumonia, portal vein thrombosis. He was started on Coumadin. He presented to [facility] hospital with progressively worsening abdominal pain with associated nausea and hematemesis. His symptoms started about 3-4 days prior to this admission. Patient was hemodynamically stable on the admission. His blood work revealed white blood cell count of 12.1, hemoglobin of 7.7, creatinine of 1.40, INR 2.99. Chest x-Eliot revealed slight improvement of right base subsegmental atelectasis and/or infiltrate. He was on Levaquin at home which he was supposed to complete by 08/13/2014 for pneumonia. He was given 2 units of PRBC transfusion and admitted to stepdown unit for observation due to risk of bleeding. Critical care medicine and gastroenterology has seen the patient in consultation.  Barrier to discharge: Per GI, recommendation is for endoscopy tomorrow for evaluation of upper GI bleed.  Assessment/Plan:     Principal problem: Upper GI bleed / hematemesis / acute blood loss anemia / anemia of chronic disease - In patient with known history of alcohol abuse. Concern for esophageal varices. Patient also has underlying anemia which is likely secondary to bone marrow suppression from history of hepatitis C, HIV and alcohol abuse. - Bleed likely exacerbated by anticoagulation with Coumadin which patient recently started for portal vein thrombosis. Coumadin on hold. - Plan for endoscopy tomorrow per GI recommendations. - Hemoglobin was 7.7 on  admission. Patient is status post 1 unit of PRBC transfusion. Posttransfusion hemoglobin is stable at 10.1. - Continue octreotide and Protonix drip. Continue Protonix 40 mg IV every 12 hours. - Continue to monitor for bleed in step down unit for next 24 hours.  Active problems: Liver cirrhosis with ascites - Patient was recently hospitalized during which time he had paracentesis with about 3.5 L of fluid removed. - We'll continue to monitor clinically when paracentesis is needed - Lasix and spironolactone are on hold because of currently soft blood pressure, blood pressure is 104/63.  Liver mass - Patient recently found to have hepatoma. He is alpha-fetoprotein on 08/06/2014 was 8074. This is consistent with atypical hepatocellular carcinoma. - He has been referred to Dr. Lindi Adie of oncology who recommended and made referral to [facility] hepatology. Patient is not a good candidate for resection or ablation.  Portal Vein Thrombosis - As mentioned above, Coumadin on hold because of risk of bleeding.  Alcohol dependence / Alcohol abuse - Alcohol level not checked at the time of the admission. Order placed for alcohol and urine drug screen checked.  Chronic hepatitis C without coma - Previously treated with interferon.  Leukocytosis - Patient was on Levaquin for pneumonia since previous admission. Last dose today 08/13/2014. - He has no fever but his white blood cell count is trending up. - Critical care was okayed that Levaquin is stopped today. Low threshold to resume anti-biotics especially if white blood cell count continues to rise and patient experiences more abdominal pain. Cannot really rule out spontaneous bacterial peritonitis.  HIV - Last CD4 count 03/2014 670  - Resume anti-retroviral therapy.  Severe protein calorie malnutrition  - In the context of chronic illness. Nutrition consulted.  DVT Prophylaxis  - SCD's bilaterally due to risk of bleeding    Code  Status: Full.  Family Communication:  plan of care discussed with the patient Disposition Plan: Plan for EGD tomorrow   IV access:  Peripheral IV  Procedures and diagnostic studies:    Ct Cervical Spine Wo Contrast 08/12/2014   1. No evidence of significant acute traumatic injury to the cervical spine. 2. Mild multilevel degenerative disc disease and cervical spondylosis, as above.   Electronically Signed   By: Vinnie Langton M.D.   On: 08/12/2014 21:20   Dg Chest Port 1 View 08/13/2014    1. Slight improvement of right base subsegmental atelectasis and or infiltrate. 2. Mild left mid lung field subsegmental atelectasis.   Electronically Signed   By: Marcello Moores  Register   On: 08/13/2014 07:08   Medical Consultants:  Gastroenterology, Dr. Clarene Essex PCCM, Dr. Arizona Constable   Other Consultants:  Nutrition  IAnti-Infectives:   None    Leisa Lenz, MD  [facility] Pager [phone]  Time spent in minutes: 25 minutes  If 7PM-7AM, please contact night-coverage [email or web address] Password [facility] 08/13/2014, 11:39 AM   LOS: 1 day    HPI/Subjective: No acute overnight events. Patient reports 1 episode of hematemesis this am.   Objective: Filed Vitals:   08/13/14 0700 08/13/14 0740 08/13/14 0800 08/13/14 0900  BP: 115/79  111/75 104/63  Pulse: 104  98 94  Temp:  98.3 F (36.8 C)    TempSrc:  Oral    Resp: 15  18 18   Height:      Weight:      SpO2: 94%  93% 97%    Intake/Output Summary (Last 24 hours) at 08/13/14 1139 Last data filed at 08/13/14 0700  Gross per 24 hour  Intake 1435.42 ml  Output      0 ml  Net 1435.42 ml    Exam:   General:  Pt is alert, no distress  Cardiovascular: tachycardic, S1/S2 appreciated   Respiratory: Clear to auscultation bilaterally, no wheezing, no crackles, no rhonchi  Abdomen: mildly distended, non tender, bowel sounds present  Extremities: No edema, pulses DP and PT palpable bilaterally  Neuro: Grossly nonfocal  Data  Reviewed: Basic Metabolic Panel:  Recent Labs Lab 08/07/14 0513 08/08/14 0430 08/09/14 0538 08/12/14 1626 08/12/14 1627 08/13/14 0343  NA 135 134* 132* 136 136 136  K 3.8 3.5 3.3* 4.0 3.7 4.2  CL 108 104 105 110 108 110  CO2 18* 22 20*  --  16* 19*  GLUCOSE 118* 100* 98 146* 153* 149*  BUN 9 8 10  24* 24* 35*  CREATININE 1.11 0.95 1.14 1.40* 1.59* 1.74*  CALCIUM 8.4* 8.6* 8.8*  --  8.5* 7.8*  MG  --   --   --   --   --  2.2  PHOS  --   --   --   --   --  5.8*   Liver Function Tests:  Recent Labs Lab 08/07/14 0513 08/12/14 1627 08/13/14 0343  AST 80* 140* 805*  ALT 35 48 290*  ALKPHOS 149* 142* 196*  BILITOT 1.3* 1.3* 2.3*  PROT 7.9 6.9 6.3*  ALBUMIN 2.0* 2.1* 2.1*    Recent Labs Lab 08/12/14 1627  LIPASE 44   No results for input(s): AMMONIA in the last 168 hours. CBC:  Recent Labs Lab 08/07/14 0513 08/08/14 0430 08/12/14 1626 08/12/14 1627 08/13/14 0343 08/13/14 0915  WBC 6.0 6.0  --  12.1* 13.1* 14.9*  HGB  10.9* 10.8* 9.2* 7.7* 10.1* 9.9*  HCT 32.5* 32.5* 27.0* 22.9* 30.2* 29.0*  MCV 79.7 78.5  --  80.6 80.5 77.7*  PLT 331 348  --  294 257 239   Cardiac Enzymes: No results for input(s): CKTOTAL, CKMB, CKMBINDEX, TROPONINI in the last 168 hours. BNP: Invalid input(s): POCBNP CBG:  Recent Labs Lab 08/13/14 0003  GLUCAP 149*   Results for orders placed or performed during the hospital encounter of 08/12/14  MRSA PCR Screening     Status: None   Collection Time: 08/12/14  9:36 PM  Result Value Ref Range Status   MRSA by PCR NEGATIVE NEGATIVE Final     Scheduled Meds: . [START ON 08/16/2014] pantoprazole (PROTONIX) IV  40 mg Intravenous Q12H   Continuous Infusions: . octreotide  (SANDOSTATIN)    IV infusion 50 mcg/hr (08/13/14 1123)  . pantoprozole (PROTONIX) infusion 8 mg/hr (08/13/14 1123)

## 2014-08-13 NOTE — Anesthesia Postprocedure Evaluation (Signed)
  Anesthesia Post-op Note  Patient: Logan Combs  Procedure(s) Performed: Procedure(s): ESOPHAGOGASTRODUODENOSCOPY (EGD) (N/A)  Patient Location: PACU and ICU  Anesthesia Type:General  Level of Consciousness: sedated  Airway and Oxygen Therapy: Patient remains intubated per anesthesia plan and Patient placed on Ventilator (see vital sign flow sheet for setting)  Post-op Pain: none  Post-op Assessment: Post-op Vital signs reviewed, PATIENT'S CARDIOVASCULAR STATUS UNSTABLE and Respiratory Function Stable              Post-op Vital Signs: Reviewed  Last Vitals:  Filed Vitals:   08/13/14 1500  BP: 125/37  Pulse:   Temp: 36.4 C  Resp: 14    Complications: No apparent anesthesia complications. Still bleeding.

## 2014-08-13 NOTE — Telephone Encounter (Signed)
Message was received from Joellyn Quails, CM that the patient is hospitalized.  Appointment for today at the [facility] has been cancelled. Will follow his hospital progress and reschedule medical follow up as recommended,

## 2014-08-13 NOTE — Op Note (Addendum)
[place], [id number]   ENDOSCOPY PROCEDURE REPORT  PATIENT: Logan Combs, Logan Combs  MR#: [id number] BIRTHDATE: [birth year] , 63  yrs. old GENDER: male ENDOSCOPIST: Clarene Essex, MD REFERRED BY: PROCEDURE DATE:  2014/08/20 PROCEDURE:  EGD w/ control of bleeding and EGD w/ band ligation of varices ASA CLASS:     Class III INDICATIONS:  hematemesis. MEDICATIONS: Per Anesthesia TOPICAL ANESTHETIC: none  DESCRIPTION OF PROCEDURE: After the risks benefits and alternatives of the procedure were thoroughly explained, informed consent was obtained.  The Pentax Gastroscope [id number] endoscope was introduced through the mouth and advanced to the second portion of the duodenum , Without limitations.  The instrument was slowly withdrawn as the mucosa was fully examined. Estimated blood loss was difficult to quantitate based on lots of washing.    the findings are recorded below       Retroflexed views revealed Clots.     The scope was then withdrawn from the patient and the procedure completed.  COMPLICATIONS: There were no immediate complications.  ENDOSCOPIC IMPRESSION: 1. 1 distal varix with white nipple sign and red wheal 2. minimal other distal  esophageal varices        3. significant clots in the proximal stomach 4. otherwise normal endoscopy to include good look at the antrum  bulb and second portion of the duodenum     therapy :  we tried to place the band directly over the white nipple sign but that band fell off and then we saw spurting of blood distal to that and placed 2 bands distally which seemed to stop that spurting however there was some oozing in the distal esophagus which seem to be coming from a tear probably from the banding apparatus or the band falling off which was ozing fairly heavily and we went ahead and injected  epinephrine multiple times into the probable tear and used a total of 4 clips and we proceeded with a few more  injections of  epinephrine after the clips were placed using a total of 20 mL however some was not injected submucosally  and the oozing was much decreased but not completely stopped but no obvious lesion to treat was remaining although with lots of blood   and fluid requiring suctioning and washed with the scope visualization was difficult but after our prolonged effort we elected to stop the procedure at this point  RECOMMENDATIONS: observe for delayed complications. try to stabilize the patient medically and keep him intubated in the ICU and we'll probably need a repeat endoscopy at some point   and continue to treat with octreotide and Protonix for now as well as support with blood products as needed  REPEAT EXAM:  as above  eSigned:  Clarene Essex, MD 20-Aug-2014 2:44 PM Revised: 2014-08-20 2:44 PM   CC:  CPT CODES: ICD CODES:  The ICD and CPT codes recommended by this software are interpretations from the data that the clinical staff has captured with the software.  The verification of the translation of this report to the ICD and CPT codes and modifiers is the sole responsibility of the health care institution and practicing physician where this report was generated.  [employer] will not be held responsible for the validity of the ICD and CPT codes included on this report.  AMA assumes no liability for data contained or not contained herein. CPT is a Designer, television/film set of the [employer].  PATIENT NAME:  Logan Combs  M MR#: [id number]

## 2014-08-13 NOTE — Anesthesia Preprocedure Evaluation (Addendum)
Anesthesia Evaluation  Patient identified by MRN, date of birth, ID band Patient awake    Reviewed: Allergy & Precautions, NPO status , Patient's Chart, lab work & pertinent test results  Airway Mallampati: II  TM Distance: >3 FB Neck ROM: Full    Dental no notable dental hx. (+) Poor Dentition, Missing   Pulmonary neg pulmonary ROS, former smoker,  breath sounds clear to auscultation  Pulmonary exam normal       Cardiovascular negative cardio ROS Normal cardiovascular examRhythm:Regular Rate:Normal     Neuro/Psych negative neurological ROS  negative psych ROS   GI/Hepatic negative GI ROS, (+) Hepatitis -, CLiver mass.   Elevated LFTs.   Endo/Other  negative endocrine ROS  Renal/GU negative Renal ROS  negative genitourinary   Musculoskeletal negative musculoskeletal ROS (+)   Abdominal   Peds negative pediatric ROS (+)  Hematology negative hematology ROS (+) anemia , HIV,   Anesthesia Other Findings   Reproductive/Obstetrics negative OB ROS                            Anesthesia Physical Anesthesia Plan  ASA: IV  Anesthesia Plan: General   Post-op Pain Management:    Induction: Intravenous, Rapid sequence and Cricoid pressure planned  Airway Management Planned: Oral ETT  Additional Equipment:   Intra-op Plan:   Post-operative Plan: Extubation in OR  Informed Consent: I have reviewed the patients History and Physical, chart, labs and discussed the procedure including the risks, benefits and alternatives for the proposed anesthesia with the patient or authorized representative who has indicated his/her understanding and acceptance.   Dental advisory given  Plan Discussed with: CRNA  Anesthesia Plan Comments: (Type and cross for 2 units.  Low threshold for central line and aline if needed.)       Anesthesia Quick Evaluation

## 2014-08-13 NOTE — Progress Notes (Signed)
[facility] Progress Note Patient Name: Logan Combs DOB: 07-Sep-1950 MRN: [id number]   Date of Service  08/13/2014  HPI/Events of Note  Hypotension after fentanyl  eICU Interventions  Bolus 1L.      Intervention Category Major Interventions: Hypotension - evaluation and management  Laverle Hobby 08/13/2014, 5:28 PM

## 2014-08-13 NOTE — Progress Notes (Signed)
PULMONARY / CRITICAL CARE MEDICINE   Name: Logan Combs MRN: [id number] DOB: 1950/02/06    ADMISSION DATE:  08/12/2014 CONSULTATION DATE:  08/12/2014   REFERRING MD :  EDP  CHIEF COMPLAINT:  Hematemesis  INITIAL PRESENTATION: 64 year old male with PMH of HIV, HCV and etoh abuse, liver failure, ascites who was recently admitted to [facility] with SOB and was noted to have a portal vein thrombosis and liver mass consistent with hepatocellular carcinoma.  He was discharged on coumadin.  Returns to the hospital with hematemesis and melena.  Lactic acid was noted to be 8.39 and Hg dropped from 10.2 3 days ago to 7.7. He was transfused and given volume with minimal improvement.  STUDIES:  7/11 CT cervial Spine wo contrast: no acute injury; multilevel degenerative disc disease  SIGNIFICANT EVENTS: 7/5-7/8 > admission for CAP, cirrhosis, portal vein thrombosis 7/11 > Re-admit, GI consulted, vitK and [facility] admin in ED.  Intubated.  EGD with varices noted s/p banding x 2, injection with epinephrine, and placement of 4 clips.  Trop bump with inferior ST elevations.  SUBJECTIVE: Called by Warren Lacy MD and asked to assess pt at bedside.  Pt admitted with hematemesis and melena.  Required intubation 7/12 prior to EGD which revealed distal varix noted s/p banding x 2, injection with epinephrine, and placement of 4 clips.    Later that PM, began to have agitation so bolused with fentanyl.  Shortly thereafter, became profoundly hypotensive with SBP in 50's despite multiple IVF boluses.  Course further complicated by ST changes noted on monitor by RN and troponin bump of 7.43   VITAL SIGNS: Temp:  [94.1 F (34.5 C)-98.5 F (36.9 C)] 97.7 F (36.5 C) (07/12 2100) Pulse Rate:  [81-107] 81 (07/12 1945) Resp:  [12-20] 13 (07/12 2100) BP: (61-162)/(37-96) 61/46 mmHg (07/12 2015) SpO2:  [70 %-100 %] 100 % (07/12 2100) Arterial Line BP: (60-130)/(47-83) 65/49 mmHg (07/12 2100) FiO2 (%):  [40 %-50 %] 40 %  (07/12 1945) Weight:  [60.8 kg (134 lb 0.6 oz)] 60.8 kg (134 lb 0.6 oz) (07/12 1458) INTAKE / OUTPUT:  Intake/Output Summary (Last 24 hours) at 08/13/14 2153 Last data filed at 08/13/14 2000  Gross per 24 hour  Intake 4712.84 ml  Output   2400 ml  Net 2312.84 ml    PHYSICAL EXAMINATION: General:  Chronically ill appearing male, emaciated, intubated. Neuro:  Sedated, does not follow commands. HEENT:  [place]/AT, PERRL.  ETT in place. Cardiovascular:  Heart sounds distant, no M/R/G appreciated. Lungs:  CTA bilaterally. Abdomen:  BS x 4, Soft, NT/ND. Musculoskeletal:  No edema and no tenderness.. Skin:  Multiple wounds covered in drsg on BUE and BLE (on admission, pt attributed them to dog scratches).  LABS:  CBC  Recent Labs Lab 08/13/14 0343 08/13/14 0915 08/13/14 1820  WBC 13.1* 14.9* 14.2*  HGB 10.1* 9.9* 9.9*  HCT 30.2* 29.0* 29.1*  PLT 257 239 110*   Coag's  Recent Labs Lab 08/08/14 1239  08/12/14 1948 08/13/14 0600 08/13/14 1820  APTT 43*  --   --   --   --   INR 1.36  < > 1.67* 1.41 1.53*  < > = values in this interval not displayed.   BMET  Recent Labs Lab 08/12/14 1627 08/13/14 0343 08/13/14 1945  NA 136 136 137  K 3.7 4.2 3.8  CL 108 110 114*  CO2 16* 19* 16*  BUN 24* 35* 45*  CREATININE 1.59* 1.74* 1.91*  GLUCOSE 153* 149* 200*  Electrolytes  Recent Labs Lab 08/12/14 1627 08/13/14 0343 08/13/14 1945  CALCIUM 8.5* 7.8* 6.5*  MG  --  2.2  --   PHOS  --  5.8*  --    Sepsis Markers  Recent Labs Lab 08/12/14 1627 08/12/14 1741 08/13/14 0343  LATICACIDVEN 8.39* 8.34* 1.9   Liver Enzymes  Recent Labs Lab 08/12/14 1627 08/13/14 0343 08/13/14 1945  AST 140* 805* 884*  ALT 48 290* 317*  ALKPHOS 142* 196* 189*  BILITOT 1.3* 2.3* 2.2*  ALBUMIN 2.1* 2.1* 1.7*   Glucose  Recent Labs Lab 08/13/14 0003 08/13/14 1902 08/13/14 2003  GLUCAP 149* 173* 211*    Imaging Dg Chest Port 1 View  08/13/2014   CLINICAL DATA:  Central  line placement.  EXAM: PORTABLE CHEST - 1 VIEW  COMPARISON:  08/13/2014  FINDINGS: The endotracheal tube is 5 cm above the carina. There is a right IJ central venous Cordis with its tip in the mid SVC. No complicating features. Persistent areas of subsegmental atelectasis at the lung bases but no edema or effusions.  IMPRESSION: Right IJ central venous Cordis in good position without complicating features.  Endotracheal tube is 5 cm above the carina.  Bibasilar atelectasis but infiltrates or effusions.   Electronically Signed   By: Marijo Sanes M.D.   On: 08/13/2014 15:23   Dg Chest Port 1 View  08/13/2014   CLINICAL DATA:  Hypoxia.  EXAM: PORTABLE CHEST - 1 VIEW  COMPARISON:  08/05/2014.  FINDINGS: Mediastinum hilar structures stable. Slight improvement of right base subsegmental atelectasis/infiltrate. Mild left mid lung field subsegmental atelectasis. No pleural effusion or pneumothorax. Heart size normal.  IMPRESSION: 1. Slight improvement of right base subsegmental atelectasis and or infiltrate. 2. Mild left mid lung field subsegmental atelectasis.   Electronically Signed   By: Marcello Moores  Register   On: 08/13/2014 07:08     ASSESSMENT / PLAN:  PULMONARY OETT 7/12 >>> A: VDRF - in setting acute UGIB due to varices. P:   Full vent support. Wean as able. Hold SBT until decision on repeat EGD confirmed - would keep intubated if GI planning to repeat EGD. CXR in AM.  CARDIOVASCULAR R IJ cordis 7/12 >>> A:  Shock - likely combination of hypovolemic / hemorrhagic in setting GI bleed as well as possible cardiogenic in setting acute MI. Acute inferior STEMI - does have some inferior Q waves so ? chronicity. Elevated lactic acid > cleared (7/12 level 1.9). Plan: Levophed as needed to maintain goal MAP > 65. Consider vasopressin.  Heparin / ASA contraindicated given acute GI hemorrhage. Beta blocker restricted due to shock. Trend troponins. Repeat EKG in AM. Check lactate. Discussed with  cards (Dr. Claiborne Billings) - no further interventions at this time. Hold lasix/spironolactone.  RENAL A:   NAG metabolic acidosis. CKD. P:   HCO3 @ 100/hr. CMP in AM.  GASTROINTESTINAL A:   UGIB due to varices - s/p banding x 2, injection with epinephrine, and placement of 4 clips (Dr. Watt Climes). Transaminitis - due to liver failure. Portal vein thrombosis. Liver mass consistent with hepatocellular carcinoma (not candidate for resection/ablation). P:   Continue PPI, octreotide. GI following. Monitor for further signs of bleeding. Trend LFTs. NPO.  HEMATOLOGIC A:   Acute blood loss anemia - due to varices. Coagulopathy - due to warfarin +/- cirrhosis. P: Transfuse for Hgb < 8 given troponin bump and ST changes. Hold coumadin/lovenox. H/H q6hrs.  INFECTIOUS A:   Recent CAP. HIV/AIDS, HCV. P:   Levaquin from  prior admit. Last day 7/12. Follow WBC and fever curve. Followed by Dr. Megan Salon ID; ID notified 7/12 - Dr. Johnnye Sima. Continue HAART per ID. Levaquin >>> stop 7/12.  ENDOCRINE A:   Hyperglycemia - no hx DM. P:   SSI. Check TSH.  NEUROLOGIC A:   Acute metabolic encephalopathy. P:   Sedation:  Precedex gtt / Fentanyl PRN. RASS Goal:  0 to -1. Daily WUA.   Family updated:  Long discussion with sister, Phylis over the phone.  She will come by the hospital this evening.  She has requested to continue with full code for the time being.  Additional CC time:  45 minutes.   Montey Hora, [facility] Pager: [phone]  or [phone] 08/13/2014, 10:16 PM

## 2014-08-13 NOTE — Transfer of Care (Signed)
Immediate Anesthesia Transfer of Care Note  Patient: Logan Combs  Procedure(s) Performed: Procedure(s): ESOPHAGOGASTRODUODENOSCOPY (EGD) (N/A)  Patient Location: ICU  Anesthesia Type:General  Level of Consciousness: unresponsive  Airway & Oxygen Therapy: Patient remains intubated per anesthesia plan  Post-op Assessment: Report given to RN and Post -op Vital signs reviewed and stable  Post vital signs: Reviewed and stable  Last Vitals:  Filed Vitals:   08/13/14 1155  BP: 83/52  Pulse: 93  Temp: 36.4 C  Resp: 15    Complications: No apparent anesthesia complications

## 2014-08-13 NOTE — Progress Notes (Signed)
RT called to Endo to transport patient on vent to ICU room 1229.  Vitals were monitored and remained stable. No complications. RT will continue to monitor.

## 2014-08-13 NOTE — Consult Note (Addendum)
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 INR reversed. Octreotide. PPI Watch his LFTs  Comment- Very complicated case with multiple co-morbidities.  Will defer comments on his outcome and when and if palliative care should be involved.   Thank you so much for this interesting consult,   [name] (pager) [phone] [email or web address]  Logan Combs is an 64 y.o. male.  HPI: 64 yo M with hx of HIV+ (CD4 670 and HIV RNA <20 (03-12-14) on atripla).  He was adm to [facility] 7-4 to 7-8 with 2 weeks of increasing abd girth and pain. He was hypoxic in ED and was treated with levaquin after his CXR showed a RLL infiltrate. He also underwent paracentesis which removed 3.5 L and was notable for 257 WBC (54%L). He was also found on MRI of abd to have a liver mass felt to be a hepatoma (AFP > 8000, seen by h/o and referred to [facility]) as well a portal vein thrombosis. He was started on LMWH/coumadin and d/c home on 7-8.  His ART was held at d/c, he has been off since April when his health insurance ended.  He returned on 7-11 with hematemesis and melena, lactic acid 8.39 and hgb 7.7 (prev 10.2). INR 2.99.  He underwent EGD showing esophageal varicies which were injected and clipped 7-12.   He is intubated now.   Past Medical History  Diagnosis Date  . HIV (human immunodeficiency virus infection)   . Hepatitis C     Past Surgical History  Procedure Laterality Date  . No past surgeries        Allergies  Allergen Reactions  . Penicillins Anaphylaxis  . Codeine Hives and Itching    Medications:  Scheduled: . dolutegravir  50 mg Oral Daily  . emtricitabine-tenofovir  1 tablet Oral Q48H  . [START ON 08/16/2014] pantoprazole (PROTONIX) IV  40 mg Intravenous Q12H    Abtx:  Anti-infectives    Start     Dose/Rate Route Frequency Ordered Stop   08/13/14 1330  ciprofloxacin (CIPRO) IVPB 400 mg  Status:  Discontinued     400 mg 200 mL/hr over 60 Minutes Intravenous  Once 08/13/14 1321 08/13/14 1500   08/13/14 1315  ciprofloxacin (CIPRO) IVPB 400 mg  Status:  Discontinued     400 mg 200 mL/hr over 60 Minutes Intravenous Every 12 hours 08/13/14 1313 08/13/14 1321   08/13/14 1300  dolutegravir (TIVICAY) tablet 50 mg     50 mg Oral Daily 08/13/14 1152     08/13/14 1300  emtricitabine-tenofovir (TRUVADA) 200-300 MG per tablet 1 tablet     1 tablet Oral Every 48 hours 08/13/14 1158     08/13/14 1200  emtricitabine-tenofovir (TRUVADA) 200-300 MG per tablet 1 tablet  Status:  Discontinued     1 tablet Oral Daily 08/13/14 1152 08/13/14 1157      Total days of antibiotics: 0          Social History:  reports that he quit smoking about 3 months ago. He has never used smokeless tobacco. He  reports that he drinks about 13.2 oz of alcohol per week. He reports that he does not use illicit drugs.  History reviewed. No pertinent family history.  General ROS: unobtainable intubated.   Blood pressure 90/61, pulse 102, temperature 97.5 F (36.4 C), temperature source Oral, resp. rate 16, height _0  (1.753 m), weight 54.1 kg (119 lb 4.3 oz), SpO2 70 %. General appearance: on vent.  Eyes: R eye no injection, no icterus. L eye taped Neck: no adenopathy and R IJ cordis Lungs: clear to auscultation bilaterally Heart: regular rate and rhythm Abdomen: normal findings: bowel sounds normal and soft, non-tender and abnormal findings:  +fluid wave Extremities: edema none and abrasions on  knees   Results for orders placed or performed during the hospital encounter of 08/12/14 (from the past 48 hour(s))  POC occult blood, ED Provider will collect     Status: Abnormal   Collection Time: 08/12/14  4:10 PM  Result Value Ref Range   Fecal Occult Bld POSITIVE (A) NEGATIVE  I-stat chem 8, ed     Status: Abnormal   Collection Time: 08/12/14  4:26 PM  Result Value Ref Range   Sodium 136 135 - 145 mmol/L   Potassium 4.0 3.5 - 5.1 mmol/L   Chloride 110 101 - 111 mmol/L   BUN 24 (H) 6 - 20 mg/dL   Creatinine, Ser 1.40 (H) 0.61 - 1.24 mg/dL   Glucose, Bld 146 (H) 65 - 99 mg/dL   Calcium, Ion 1.06 (L) 1.13 - 1.30 mmol/L   TCO2 13 0 - 100 mmol/L   Hemoglobin 9.2 (L) 13.0 - 17.0 g/dL   HCT 27.0 (L) 39.0 - 52.0 %  CBC     Status: Abnormal   Collection Time: 08/12/14  4:27 PM  Result Value Ref Range   WBC 12.1 (H) 4.0 - 10.5 K/uL   RBC 2.84 (L) 4.22 - 5.81 MIL/uL   Hemoglobin 7.7 (L) 13.0 - 17.0 g/dL   HCT 22.9 (L) 39.0 - 52.0 %   MCV 80.6 78.0 - 100.0 fL   MCH 27.1 26.0 - 34.0 pg   MCHC 33.6 30.0 - 36.0 g/dL   RDW 19.0 (H) 11.5 - 15.5 %   Platelets 294 150 - 400 K/uL  Comprehensive metabolic panel     Status: Abnormal   Collection Time: 08/12/14  4:27 PM  Result Value Ref Range   Sodium 136 135 - 145 mmol/L   Potassium 3.7 3.5 - 5.1 mmol/L   Chloride 108 101 - 111 mmol/L   CO2 16 (L) 22 - 32 mmol/L   Glucose, Bld 153 (H) 65 - 99 mg/dL   BUN 24 (H) 6 - 20 mg/dL   Creatinine, Ser 1.59 (H) 0.61 - 1.24 mg/dL   Calcium 8.5 (L) 8.9 - 10.3 mg/dL   Total Protein 6.9 6.5 - 8.1 g/dL   Albumin 2.1 (L) 3.5 - 5.0 g/dL   AST 140 (H) 15 - 41 U/L   ALT 48 17 - 63 U/L   Alkaline Phosphatase 142 (H) 38 - 126 U/L   Total Bilirubin 1.3 (H) 0.3 - 1.2 mg/dL   GFR calc non Af Amer 45 (L) >60 mL/min   GFR calc Af Amer 52 (L) >60 mL/min    Comment: (NOTE) The eGFR has been calculated using the CKD EPI equation. This calculation has not been validated in all clinical situations. eGFR's  persistently <60 mL/min signify possible Chronic Kidney Disease.    Anion gap 12 5 - 15  Lipase, blood     Status: None   Collection Time: 08/12/14  4:27 PM  Result Value Ref Range   Lipase 44 22 - 51 U/L  Protime-INR     Status: Abnormal   Collection Time: 08/12/14  4:27 PM  Result Value Ref Range   Prothrombin Time 30.5 (H) 11.6 - 15.2 seconds   INR 2.99 (H) 0.00 - 1.49  I-Stat CG4 Lactic Acid, ED     Status: Abnormal   Collection Time: 08/12/14  4:27 PM  Result Value Ref Range   Lactic Acid, Venous 8.39 (HH) 0.5 - 2.0 mmol/L   Comment NOTIFIED PHYSICIAN   Type and screen     Status: None (Preliminary result)   Collection Time: 08/12/14  4:28 PM  Result Value Ref Range   ABO/RH(D) A POS    Antibody Screen NEG    Sample Expiration 08/15/2014    Unit Number [id number]    Blood Component Type RED CELLS,LR    Unit division 00    Status of Unit ISSUED,FINAL    Transfusion Status OK TO TRANSFUSE    Crossmatch Result Compatible    Unit Number [id number]    Blood Component Type RED CELLS,LR    Unit division 00    Status of Unit ISSUED,FINAL    Transfusion Status OK TO TRANSFUSE    Crossmatch Result Compatible    Unit Number [id number]    Blood Component Type RED CELLS,LR    Unit division 00    Status of Unit ISSUED    Transfusion Status OK TO TRANSFUSE    Crossmatch Result Compatible    Unit Number [id number]    Blood Component Type RED CELLS,LR    Unit division 00    Status of Unit ISSUED    Transfusion Status OK TO TRANSFUSE    Crossmatch Result Compatible    Unit Number [id number]    Blood Component Type RED CELLS,LR    Unit division 00    Status of Unit ISSUED    Transfusion Status OK TO TRANSFUSE    Crossmatch Result Compatible    Unit Number [id number]    Blood Component Type RED CELLS,LR    Unit division 00    Status of Unit ISSUED    Transfusion Status OK TO TRANSFUSE    Crossmatch Result Compatible   ABO/Rh     Status: None    Collection Time: 08/12/14  4:30 PM  Result Value Ref Range   ABO/RH(D) A POS   Prepare RBC     Status: None   Collection Time: 08/12/14  5:30 PM  Result Value Ref Range   Order Confirmation ORDER PROCESSED BY BLOOD BANK   I-Stat CG4 Lactic Acid, ED     Status: Abnormal   Collection Time: 08/12/14  5:41 PM  Result Value Ref Range   Lactic Acid, Venous 8.34 (HH) 0.5 - 2.0 mmol/L   Comment NOTIFIED PHYSICIAN   Cortisol     Status: None   Collection Time: 08/12/14  7:40 PM  Result Value Ref Range   Cortisol, Plasma 65.6 ug/dL    Comment: RESULTS CONFIRMED BY MANUAL DILUTION (NOTE) AM    6.7 - 22.6 ug/dL PM   <10.0       ug/dL Performed at [facility]     Status: Abnormal   Collection Time: 08/12/14  7:48 PM  Result Value Ref Range   Prothrombin Time 19.7 (H) 11.6 - 15.2 seconds   INR 1.67 (H) 0.00 -  1.49  MRSA PCR Screening     Status: None   Collection Time: 08/12/14  9:36 PM  Result Value Ref Range   MRSA by PCR NEGATIVE NEGATIVE    Comment:        The GeneXpert MRSA Assay (FDA approved for NASAL specimens only), is one component of a comprehensive MRSA colonization surveillance program. It is not intended to diagnose MRSA infection nor to guide or monitor treatment for MRSA infections.   Glucose, capillary     Status: Abnormal   Collection Time: 08/13/14 12:03 AM  Result Value Ref Range   Glucose-Capillary 149 (H) 65 - 99 mg/dL   Comment 1 Notify RN    Comment 2 Document in Chart   Lactic acid, plasma     Status: None   Collection Time: 08/13/14  3:43 AM  Result Value Ref Range   Lactic Acid, Venous 1.9 0.5 - 2.0 mmol/L  Comprehensive metabolic panel     Status: Abnormal   Collection Time: 08/13/14  3:43 AM  Result Value Ref Range   Sodium 136 135 - 145 mmol/L   Potassium 4.2 3.5 - 5.1 mmol/L   Chloride 110 101 - 111 mmol/L   CO2 19 (L) 22 - 32 mmol/L   Glucose, Bld 149 (H) 65 - 99 mg/dL   BUN 35 (H) 6 - 20 mg/dL   Creatinine, Ser 1.74  (H) 0.61 - 1.24 mg/dL   Calcium 7.8 (L) 8.9 - 10.3 mg/dL   Total Protein 6.3 (L) 6.5 - 8.1 g/dL   Albumin 2.1 (L) 3.5 - 5.0 g/dL   AST 805 (H) 15 - 41 U/L   ALT 290 (H) 17 - 63 U/L   Alkaline Phosphatase 196 (H) 38 - 126 U/L   Total Bilirubin 2.3 (H) 0.3 - 1.2 mg/dL   GFR calc non Af Amer 40 (L) >60 mL/min   GFR calc Af Amer 46 (L) >60 mL/min    Comment: (NOTE) The eGFR has been calculated using the CKD EPI equation. This calculation has not been validated in all clinical situations. eGFR's persistently <60 mL/min signify possible Chronic Kidney Disease.    Anion gap 7 5 - 15  Magnesium     Status: None   Collection Time: 08/13/14  3:43 AM  Result Value Ref Range   Magnesium 2.2 1.7 - 2.4 mg/dL  Phosphorus     Status: Abnormal   Collection Time: 08/13/14  3:43 AM  Result Value Ref Range   Phosphorus 5.8 (H) 2.5 - 4.6 mg/dL  CBC     Status: Abnormal   Collection Time: 08/13/14  3:43 AM  Result Value Ref Range   WBC 13.1 (H) 4.0 - 10.5 K/uL   RBC 3.75 (L) 4.22 - 5.81 MIL/uL   Hemoglobin 10.1 (L) 13.0 - 17.0 g/dL    Comment: DELTA CHECK NOTED POST TRANSFUSION SPECIMEN    HCT 30.2 (L) 39.0 - 52.0 %   MCV 80.5 78.0 - 100.0 fL   MCH 26.9 26.0 - 34.0 pg   MCHC 33.4 30.0 - 36.0 g/dL   RDW 17.1 (H) 11.5 - 15.5 %   Platelets 257 150 - 400 K/uL  Protime-INR     Status: Abnormal   Collection Time: 08/13/14  6:00 AM  Result Value Ref Range   Prothrombin Time 17.3 (H) 11.6 - 15.2 seconds   INR 1.41 0.00 - 1.49  CBC     Status: Abnormal   Collection Time: 08/13/14  9:15 AM  Result Value Ref  Range   WBC 14.9 (H) 4.0 - 10.5 K/uL   RBC 3.73 (L) 4.22 - 5.81 MIL/uL   Hemoglobin 9.9 (L) 13.0 - 17.0 g/dL   HCT 29.0 (L) 39.0 - 52.0 %   MCV 77.7 (L) 78.0 - 100.0 fL   MCH 26.5 26.0 - 34.0 pg   MCHC 34.1 30.0 - 36.0 g/dL   RDW 17.4 (H) 11.5 - 15.5 %   Platelets 239 150 - 400 K/uL  Prepare RBC     Status: None   Collection Time: 08/13/14  1:00 PM  Result Value Ref Range   Order  Confirmation ORDER PROCESSED BY BLOOD BANK   Prepare fresh frozen plasma     Status: None (Preliminary result)   Collection Time: 08/13/14  3:00 PM  Result Value Ref Range   Unit Number [id number]    Blood Component Type THAWED PLASMA    Unit division 00    Status of Unit ISSUED    Transfusion Status OK TO TRANSFUSE    Unit Number [id number]    Blood Component Type THWPLS APHR2    Unit division 00    Status of Unit ISSUED    Transfusion Status OK TO TRANSFUSE       Component Value Date/Time   SDES FLUID PERITONEAL 08/06/2014 1610   SDES FLUID PERITONEAL 08/06/2014 1610   SPECREQUEST NONE 08/06/2014 1610   SPECREQUEST NONE 08/06/2014 1610   CULT NO GROWTH 5 DAYS 08/06/2014 1610   REPTSTATUS 08/06/2014 FINAL 08/06/2014 1610   REPTSTATUS 08/11/2014 FINAL 08/06/2014 1610   Ct Cervical Spine Wo Contrast  08/12/2014   CLINICAL DATA:  64 year old male with history of trauma from a fall this morning. Cervical spine clearance prior to ICU admission.  EXAM: CT CERVICAL SPINE WITHOUT CONTRAST  TECHNIQUE: Multidetector CT imaging of the cervical spine was performed without intravenous contrast. Multiplanar CT image reconstructions were also generated.  COMPARISON:  No priors.  FINDINGS: No acute displaced cervical spine fracture. Alignment is anatomic. Prevertebral soft tissues are normal. Multilevel degenerative disc disease, most severe at C6-C7. Mild multilevel facet arthropathy. Visualized portions of the upper thorax demonstrate extensive pleuroparenchymal thickening, most compatible with chronic post infectious or inflammatory scarring. Several small calcified granulomas are also incidentally noted.  IMPRESSION: 1. No evidence of significant acute traumatic injury to the cervical spine. 2. Mild multilevel degenerative disc disease and cervical spondylosis, as above.   Electronically Signed   By: Vinnie Langton M.D.   On: 08/12/2014 21:20   Dg Chest Port 1 View  08/13/2014   CLINICAL  DATA:  Central line placement.  EXAM: PORTABLE CHEST - 1 VIEW  COMPARISON:  08/13/2014  FINDINGS: The endotracheal tube is 5 cm above the carina. There is a right IJ central venous Cordis with its tip in the mid SVC. No complicating features. Persistent areas of subsegmental atelectasis at the lung bases but no edema or effusions.  IMPRESSION: Right IJ central venous Cordis in good position without complicating features.  Endotracheal tube is 5 cm above the carina.  Bibasilar atelectasis but infiltrates or effusions.   Electronically Signed   By: Marijo Sanes M.D.   On: 08/13/2014 15:23   Dg Chest Port 1 View  08/13/2014   CLINICAL DATA:  Hypoxia.  EXAM: PORTABLE CHEST - 1 VIEW  COMPARISON:  08/05/2014.  FINDINGS: Mediastinum hilar structures stable. Slight improvement of right base subsegmental atelectasis/infiltrate. Mild left mid lung field subsegmental atelectasis. No pleural effusion or pneumothorax. Heart size normal.  IMPRESSION: 1.  Slight improvement of right base subsegmental atelectasis and or infiltrate. 2. Mild left mid lung field subsegmental atelectasis.   Electronically Signed   By: Marcello Moores  Register   On: 08/13/2014 07:08   Recent Results (from the past 240 hour(s))  Blood culture (routine x 2)     Status: None   Collection Time: 08/06/14 12:03 AM  Result Value Ref Range Status   Specimen Description RIGHT ANTECUBITAL  Final   Special Requests BOTTLES DRAWN AEROBIC AND ANAEROBIC 10CC  Final   Culture NO GROWTH 5 DAYS  Final   Report Status 08/11/2014 FINAL  Final  Blood culture (routine x 2)     Status: None   Collection Time: 08/06/14 12:10 AM  Result Value Ref Range Status   Specimen Description LEFT ANTECUBITAL  Final   Special Requests BOTTLES DRAWN AEROBIC ONLY 10CC  Final   Culture NO GROWTH 5 DAYS  Final   Report Status 08/11/2014 FINAL  Final  Gram stain     Status: None   Collection Time: 08/06/14  4:10 PM  Result Value Ref Range Status   Specimen Description FLUID  PERITONEAL  Final   Special Requests NONE  Final   Gram Stain   Final    MODERATE WBC PRESENT,BOTH PMN AND MONONUCLEAR NO ORGANISMS SEEN    Report Status 08/06/2014 FINAL  Final  Culture, body fluid-bottle     Status: None   Collection Time: 08/06/14  4:10 PM  Result Value Ref Range Status   Specimen Description FLUID PERITONEAL  Final   Special Requests NONE  Final   Culture NO GROWTH 5 DAYS  Final   Report Status 08/11/2014 FINAL  Final  MRSA PCR Screening     Status: None   Collection Time: 08/12/14  9:36 PM  Result Value Ref Range Status   MRSA by PCR NEGATIVE NEGATIVE Final    Comment:        The GeneXpert MRSA Assay (FDA approved for NASAL specimens only), is one component of a comprehensive MRSA colonization surveillance program. It is not intended to diagnose MRSA infection nor to guide or monitor treatment for MRSA infections.     HIV 1 RNA QUANT (copies/mL)  Date Value  03/12/2014 <20  07/09/2013 <20  11/08/2012 <20   CD4 T CELL ABS (/uL)  Date Value  03/12/2014 670  07/09/2013 700  11/08/2012 1000      08/13/2014, 3:35 PM     LOS: 1 day

## 2014-08-13 NOTE — Progress Notes (Addendum)
Abnormal ECG result and Troponin of 7.43 called to [facility] MD.  BP of Aline 79/58(68) and cuff of 71/58(58) reported to MD., P.Ramachandran No vent changes were made on ABG drawn at 1650; clarified. No new orders received. Continue intensive monitoring.

## 2014-08-13 NOTE — Anesthesia Procedure Notes (Signed)
Procedure Name: Intubation Date/Time: 08/13/2014 1:00 PM Performed by: Danley Danker L Patient Re-evaluated:Patient Re-evaluated prior to inductionOxygen Delivery Method: Circle system utilized Preoxygenation: Pre-oxygenation with 100% oxygen Intubation Type: IV induction, Rapid sequence and Cricoid Pressure applied Laryngoscope Size: Miller and 3 Grade View: Grade I Tube type: Oral Tube size: 7.5 mm Number of attempts: 1 Airway Equipment and Method: Stylet Placement Confirmation: ETT inserted through vocal cords under direct vision,  breath sounds checked- equal and bilateral and positive ETCO2 Secured at: 21 cm Tube secured with: Tape Dental Injury: Teeth and Oropharynx as per pre-operative assessment

## 2014-08-13 NOTE — Progress Notes (Signed)
eLink Physician-Brief Progress Note Patient Name: Logan Combs DOB: 02-Oct-1950 MRN: [id number]   Date of Service  08/13/2014  HPI/Events of Note  Persistent hypotension, with MI, possible cardiogenic shock.   eICU Interventions  Bolus saline, will start pressor if BP low. No active rx of MI due to low BP and active variceal bleed.      Intervention Category Major Interventions: Hypotension - evaluation and management  Laverle Hobby 08/13/2014, 8:34 PM

## 2014-08-13 NOTE — Progress Notes (Signed)
PULMONARY / CRITICAL CARE MEDICINE   Name: Logan Combs MRN: [id number] DOB: [birth year]    ADMISSION DATE:  08/12/2014 CONSULTATION DATE:  08/12/2014   REFERRING MD :  EDP  CHIEF COMPLAINT:  Hematemesis  INITIAL PRESENTATION: 64 year old male with PMH of HIV, HCV and etoh abuse, liver failure, ascites who was recently admitted to [facility] with SOB and was noted to have a portal vein thrombosis and liver mass consistent with hepatocellular carcinoma.  He was discharged on coumadin.  Returns to the hospital with hematemesis and melena.  Lactic acid was noted to be 8.39 and Hg dropped from 10.2 3 days ago to 7.7. He was transfused and given volume with minimal improvement.  STUDIES:  7/11 CT cervial Spine wo contrast: no acute injury; multilevel degenerative disc disease  SIGNIFICANT EVENTS: 7/5-7/8 > admission for CAP, cirrhosis, portal vein thrombosis 7/11 Re-admit, GI consulted, vitK and Kcentra admin in ED  SUBJECTIVE: c/o abdominal pain.  VITAL SIGNS: Temp:  [97.5 F (36.4 C)-98.5 F (36.9 C)] 98.5 F (36.9 C) (07/12 0400) Pulse Rate:  [83-107] 104 (07/12 0700) Resp:  [12-29] 15 (07/12 0700) BP: (99-162)/(52-97) 115/79 mmHg (07/12 0700) SpO2:  [94 %-100 %] 94 % (07/12 0700) Weight:  [54.1 kg (119 lb 4.3 oz)] 54.1 kg (119 lb 4.3 oz) (07/11 2138) INTAKE / OUTPUT:  Intake/Output Summary (Last 24 hours) at 08/13/14 0759 Last data filed at 08/13/14 0700  Gross per 24 hour  Intake 1435.42 ml  Output      0 ml  Net 1435.42 ml    PHYSICAL EXAMINATION: General:  Chronically ill appearing male, emaciated Neuro:  Alert and oriented x 3. CNII-XII grossly intact HEENT:  [place]/AT, PERRL, EOM-I and MMM. Cardiovascular:  RRR, Nl S1/S2, no rubs gallops or murmurs. Left carotid bruit Lungs:  CTA bilaterally. Abdomen:  Soft, RUQ and LUQ tenderness on palpation, mild distention. Musculoskeletal:  No edema and no tenderness.. Skin:  Multiple wounds covered in drsg on BUE and BLE-pt  attributes to dogs scratching him at home  LABS:  CBC  Recent Labs Lab 08/08/14 0430 08/12/14 1626 08/12/14 1627 08/13/14 0343  WBC 6.0  --  12.1* 13.1*  HGB 10.8* 9.2* 7.7* 10.1*  HCT 32.5* 27.0* 22.9* 30.2*  PLT 348  --  294 257   Coag's  Recent Labs Lab 08/08/14 1239  08/12/14 1627 08/12/14 1948 08/13/14 0600  APTT 43*  --   --   --   --   INR 1.36  < > 2.99* 1.67* 1.41  < > = values in this interval not displayed.   BMET  Recent Labs Lab 08/09/14 0538 08/12/14 1626 08/12/14 1627 08/13/14 0343  NA 132* 136 136 136  K 3.3* 4.0 3.7 4.2  CL 105 110 108 110  CO2 20*  --  16* 19*  BUN 10 24* 24* 35*  CREATININE 1.14 1.40* 1.59* 1.74*  GLUCOSE 98 146* 153* 149*   Electrolytes  Recent Labs Lab 08/09/14 0538 08/12/14 1627 08/13/14 0343  CALCIUM 8.8* 8.5* 7.8*  MG  --   --  2.2  PHOS  --   --  5.8*   Sepsis Markers  Recent Labs Lab 08/12/14 1627 08/12/14 1741 08/13/14 0343  LATICACIDVEN 8.39* 8.34* 1.9   Liver Enzymes  Recent Labs Lab 08/07/14 0513 08/12/14 1627 08/13/14 0343  AST 80* 140* 805*  ALT 35 48 290*  ALKPHOS 149* 142* 196*  BILITOT 1.3* 1.3* 2.3*  ALBUMIN 2.0* 2.1* 2.1*   Glucose  Recent Labs Lab 08/13/14 0003  GLUCAP 149*    Imaging Ct Cervical Spine Wo Contrast  08/12/2014   CLINICAL DATA:  64 year old male with history of trauma from a fall this morning. Cervical spine clearance prior to ICU admission.  EXAM: CT CERVICAL SPINE WITHOUT CONTRAST  TECHNIQUE: Multidetector CT imaging of the cervical spine was performed without intravenous contrast. Multiplanar CT image reconstructions were also generated.  COMPARISON:  No priors.  FINDINGS: No acute displaced cervical spine fracture. Alignment is anatomic. Prevertebral soft tissues are normal. Multilevel degenerative disc disease, most severe at C6-C7. Mild multilevel facet arthropathy. Visualized portions of the upper thorax demonstrate extensive pleuroparenchymal  thickening, most compatible with chronic post infectious or inflammatory scarring. Several small calcified granulomas are also incidentally noted.  IMPRESSION: 1. No evidence of significant acute traumatic injury to the cervical spine. 2. Mild multilevel degenerative disc disease and cervical spondylosis, as above.   Electronically Signed   By: Vinnie Langton M.D.   On: 08/12/2014 21:20   Dg Chest Port 1 View  08/13/2014   CLINICAL DATA:  Hypoxia.  EXAM: PORTABLE CHEST - 1 VIEW  COMPARISON:  08/05/2014.  FINDINGS: Mediastinum hilar structures stable. Slight improvement of right base subsegmental atelectasis/infiltrate. Mild left mid lung field subsegmental atelectasis. No pleural effusion or pneumothorax. Heart size normal.  IMPRESSION: 1. Slight improvement of right base subsegmental atelectasis and or infiltrate. 2. Mild left mid lung field subsegmental atelectasis.   Electronically Signed   By: Marcello Moores  Register   On: 08/13/2014 07:08     ASSESSMENT / PLAN:  PULMONARY A: Atelectasis. P:   Bronchial hygiene  CARDIOVASCULAR A:  Hypotension in setting GI bleed (improved with volume). Elevated lactic acid> cleared (7/12 level 1.9). P:  Monitor hemodynamics  RENAL A:   Acute kidney injury in setting of GI bleed. P:   Monitor renal fx  GASTROINTESTINAL A:   Upper GI bleed in setting of ETOH/Hep C with cirrhosis. Portal vein thrombosis. Liver mass consistent with hepatocellular carcinoma (not candidate for resection/ablation). P:   Hold lasix/spironolactone Continue NPO for now Endoscopy scheduled 7/12 Continue Protonix, octreotide gtt's. Monitor for signs of bleeding Trend LFTs  HEMATOLOGIC A:   Acute blood loss anemia. Coagulopathy-warfarin +/- cirrhosis. P:  Hold coumadin/lovenox Transfuse for Hgb < 7  Or for active bleeding. Address needs for anti-coagulation pending findings on EGD. Monitor in the ICU for evidence of bleeding. H&H.  INFECTIOUS A:   Recent  CAP. HIV/AIDS. Hx of Hep C. P:   Levaquin from prior admit. Last day 7/12 Follow WBC and fever curve Followed by Dr. Megan Salon ID; will notify of patient admission and consult for HIV management ID notified 7/12 Has been holding ART medication until ID follow up.   Levaquin >>>stop 7/12  ENDOCRINE A:   No acute issues P:   Monitor  NEUROLOGIC A:   No acute issues P:   Monitor neuro status   08/13/2014 7:59 AM  Reviewed above, and examined.    His hemodynamics and oxygenation are stable.  He has mild abdominal discomfort.  Alert, thin, no wheeze, abd mild distention, decreased muscle bulk.  Continue protonix and octreotide.  Does not appear he will need intubation for EGD at this point >> EGD scheduled for later this AM.  F/u CBC, coags.  Will need to determine whether he can have anticoagulation resumed for portal vein thrombosis.  Updated family at bedside.  PCCM can be available as needed after EGD.  Will sign off otherwise.  Chesley Mires, MD [facility] Pulmonary/Critical Care 08/13/2014, 10:59 AM Pager:  [phone] After 3pm call: [phone]

## 2014-08-13 NOTE — Progress Notes (Signed)
Logan Combs 12:53 PM  Subjective: Patient and felt nauseated all day and then threw up some blood in our endoscopy unit but has not had any other bleeding and no other new complaints  Objective: Low blood pressure no acute distress answering questions exam please see preassessment evaluation labs are actually okay  Assessment: Upper GI bleeding in a patient with cirrhosis and HIV positive  Plan: Okay to proceed with endoscopy ASAP with anesthesia assistance with further workup and plans pending those findings  [facility]  Pager [phone] After 5PM or if no answer call [phone]

## 2014-08-13 NOTE — Progress Notes (Signed)
Patient seen in ICU and hemodynamically stable although they are still suctioning some bright red blood from his mouth his abdomen is soft and he has good bowel sounds and he is not on any pressors and he is oxygenating well but I would hold an oral gastric tube for now and depending on clinical course may repeat endoscopy tomorrow and case was discussed with his family as well as the nurse

## 2014-08-13 NOTE — H&P (Signed)
[facility] History and Physical  JERL MUNYAN WSF:[id number] DOB: 1950/12/29 DOA: 08/12/2014  Referring physician: Delorise Shiner, MD PCP: [name], MD   Chief Complaint: "I have been Vomiting blood"  HPI: Logan Combs is a 64 y.o. male with bellow PMH who was recently started on warfarin, bridged with low molecular weight heparin, due to portal vein thrombosis. Patient stated that since Friday he has been having progressively worse abdominal pain with nausea, decreased appetite, but no vomiting. Earlier today, he is nausea got worse to the point  that he had several hematemesis episodes. He denies having melena or previous UGI bleed. He denies having any recent alcohol consumption. He states that he has been compliant with his medications. He denies use of ASA or any other NSAIDs. However the patient doesn't provide many details about his history or his symptomatology. GI and critical care had been consulted and saw the patient while he was in the emergency room. He is in no acute distress and stated that he feels better after the blood transfusions.    Review of Systems:  Constitutional: positive weight loss, no night sweats, no Fevers, positive chills, positive fatigue.  HEENT:  Positive mild headaches, no Difficulty swallowing, no acuteTooth/dental problems, no Sore throat,  No sneezing, itching, ear ache, nasal congestion, post nasal drip,  Cardio-vascular:  No chest pain, Orthopnea, PND, swelling in lower extremities, anasarca, dizziness, palpitations  GI:  positive abdominal pain, nausea, hematemesis, diarrhea, change in bowel habits, loss of appetite  Resp:  shortness of breath with exertion. No excess mucus, no productive cough, No non-productive cough, No coughing up of blood.No change in color of mucus.No wheezing.No chest wall deformity  Skin:  no rash or lesions.  GU:  no dysuria, change in color of urine, no urgency or frequency. No flank pain.    Musculoskeletal:  No joint pain or swelling. No decreased range of motion. No back pain.  Psych:  No change in mood or affect. No depression or anxiety. No memory loss.   Past Medical History  Diagnosis Date  . HIV (human immunodeficiency virus infection)   . Hepatitis C    Past Surgical History  Procedure Laterality Date  . No past surgeries     Social History:  reports that he quit smoking about 3 months ago. He has never used smokeless tobacco. He reports that he drinks about 13.2 oz of alcohol per week. He reports that he does not use illicit drugs.  Allergies  Allergen Reactions  . Penicillins Anaphylaxis  . Codeine Hives and Itching    History reviewed. No pertinent family history.   Prior to Admission medications   Medication Sig Start Date End Date Taking? Authorizing Provider  dolutegravir (TIVICAY) 50 MG tablet Take 1 tablet (50 mg total) by mouth daily. 08/09/14  Yes Michel Bickers, MD  emtricitabine-tenofovir (TRUVADA) 200-300 MG per tablet Take 1 tablet by mouth daily. 08/09/14  Yes Michel Bickers, MD  enoxaparin (LOVENOX) 60 MG/0.6ML injection Inject 0.6 mLs (60 mg total) into the skin every 12 (twelve) hours. 08/09/14  Yes Orson Eva, MD  furosemide (LASIX) 20 MG tablet Take 1 tablet (20 mg total) by mouth daily. 08/09/14  Yes Orson Eva, MD  levofloxacin (LEVAQUIN) 750 MG tablet Take 1 tablet (750 mg total) by mouth daily. 08/09/14  Yes Orson Eva, MD  Omega-3 Fatty Acids (OMEGA 3 PO) Take 2 capsules by mouth daily.   Yes Historical Provider, MD  Probiotic Product (PROBIOTIC DAILY PO) Take  1 tablet by mouth daily.   Yes Historical Provider, MD  spironolactone (ALDACTONE) 50 MG tablet Take 1 tablet (50 mg total) by mouth daily. 08/09/14  Yes Orson Eva, MD  warfarin (COUMADIN) 2.5 MG tablet Take 1 tablet (2.5 mg total) by mouth daily. 08/09/14  Yes Orson Eva, MD   Physical Exam: Filed Vitals:   08/13/14 0100 08/13/14 0200 08/13/14 0234 08/13/14 0300  BP: 153/83 145/89 162/96  148/89  Pulse: 86 90 86 83  Temp:   98.3 F (36.8 C)   TempSrc:   Oral   Resp: 12 18 17 15   Height:      Weight:      SpO2: 97% 97% 98% 95%    Wt Readings from Last 3 Encounters:  08/12/14 54.1 kg (119 lb 4.3 oz)  08/06/14 55.4 kg (122 lb 2.2 oz)  03/26/14 56.133 kg (123 lb 12 oz)    General:  Appears calm and comfortable Eyes: PERRL, normal lids, irises & conjunctiva ENT: grossly normal hearing, lips & tongue Neck: no LAD, masses or thyromegaly Cardiovascular: RRR, no m/r/g. No LE edema. Telemetry: SR, no arrhythmias  Respiratory: CTA bilaterally, no w/r/r. Normal respiratory effort. Abdomen: No distension, soft, ntnd Skin: no rash or induration seen on limited exam Musculoskeletal: grossly normal tone BUE/BLE Psychiatric: grossly normal mood and affect, speech fluent and appropriate Neurologic: grossly non-focal.          Labs on Admission:  Basic Metabolic Panel:  Recent Labs Lab 08/07/14 0513 08/08/14 0430 08/09/14 0538 08/12/14 1626 08/12/14 1627  NA 135 134 132 136 136  K 3.8 3.5 3.3 4.0 3.7  CL 108 104 105 110 108  CO2 18 22 20   --  16  GLUCOSE 118 100 98 146 153  BUN 9 8 10 24 24   CREATININE 1.11 0.95 1.14 1.40 1.59  CALCIUM 8.4 8.6 8.8  --  8.5   Liver Function Tests:  Recent Labs Lab 08/07/14 0513 08/12/14 1627  AST 80 140  ALT 35 48  ALKPHOS 149 142  BILITOT 1.3 1.3  PROT 7.9 6.9  ALBUMIN 2.0 2.1    Recent Labs Lab 08/12/14 1627  LIPASE 44   No results for input(s): AMMONIA in the last 168 hours. CBC:  Recent Labs Lab 08/07/14 0513 08/08/14 0430 08/12/14 1626 08/12/14 1627  WBC 6.0 6.0  --  12.1  HGB 10.9 10.8 9.2 7.7  HCT 32.5 32.5 27.0 22.9  MCV 79.7 78.5  --  80.6  PLT 331 348  --  294   Cardiac Enzymes: No results for input(s): CKTOTAL, CKMB, CKMBINDEX, TROPONINI in the last 168 hours.  BNP (last 3 results) No results for input(s): BNP in the last 8760 hours.  ProBNP (last 3 results) No results for input(s):  PROBNP in the last 8760 hours.  CBG: No results for input(s): GLUCAP in the last 168 hours.  Radiological Exams on Admission: Ct Cervical Spine Wo Contrast  08/12/2014   CLINICAL DATA:  64 year old male with history of trauma from a fall this morning. Cervical spine clearance prior to ICU admission.  EXAM: CT CERVICAL SPINE WITHOUT CONTRAST  TECHNIQUE: Multidetector CT imaging of the cervical spine was performed without intravenous contrast. Multiplanar CT image reconstructions were also generated.  COMPARISON:  No priors.  FINDINGS: No acute displaced cervical spine fracture. Alignment is anatomic. Prevertebral soft tissues are normal. Multilevel degenerative disc disease, most severe at C6-C7. Mild multilevel facet arthropathy. Visualized portions of the upper thorax demonstrate extensive pleuroparenchymal thickening,  most compatible with chronic post infectious or inflammatory scarring. Several small calcified granulomas are also incidentally noted.  IMPRESSION: 1. No evidence of significant acute traumatic injury to the cervical spine. 2. Mild multilevel degenerative disc disease and cervical spondylosis, as above.   Electronically Signed   By: Vinnie Langton M.D.   On: 08/12/2014 21:20    EKG: Independently reviewed.   Assessment/Plan Principal Problem:   Upper GI bleed Active Problems:   Human immunodeficiency virus (HIV) disease   Depression   Alcohol consumption heavy   Microcytic anemia   Liver mass   Warfarin-induced coagulopathy  Admit to ICU. Dr. Collins Scotland was consulted and evaluated the patient. GI (Dr. Watt Climes) has seen the patient.  Keep NPO. Continue Octreotide and Pantoprazole infusion. Continue blood product transfusions and coagulopathy treatment protocol. DVT prophylaxis with SCDs.    Code Status: Full DVT Prophylaxis: SCDs. Family Communication: No relatives are present at the time of admission, but this is the contact information from the patient's facesheet.   Combs,Phylis  [phone]  Disposition Plan: Home with home health  Time spent: 90 minutes  Reubin Milan [facility] Pager [phone]

## 2014-08-14 ENCOUNTER — Inpatient Hospital Stay (HOSPITAL_COMMUNITY): Payer: Self-pay

## 2014-08-14 ENCOUNTER — Encounter (HOSPITAL_COMMUNITY): Payer: Self-pay | Admitting: Gastroenterology

## 2014-08-14 DIAGNOSIS — D62 Acute posthemorrhagic anemia: Secondary | ICD-10-CM

## 2014-08-14 DIAGNOSIS — J988 Other specified respiratory disorders: Secondary | ICD-10-CM

## 2014-08-14 DIAGNOSIS — N179 Acute kidney failure, unspecified: Secondary | ICD-10-CM

## 2014-08-14 DIAGNOSIS — R0602 Shortness of breath: Secondary | ICD-10-CM

## 2014-08-14 LAB — TYPE AND SCREEN
ABO/RH(D): A POS
ANTIBODY SCREEN: NEGATIVE
UNIT DIVISION: 0
UNIT DIVISION: 0
UNIT DIVISION: 0
Unit division: 0
Unit division: 0
Unit division: 0

## 2014-08-14 LAB — PREPARE FRESH FROZEN PLASMA
Unit division: 0
Unit division: 0

## 2014-08-14 LAB — BLOOD GAS, ARTERIAL
ACID-BASE DEFICIT: 6.7 mmol/L — AB (ref 0.0–2.0)
BICARBONATE: 17 meq/L — AB (ref 20.0–24.0)
DRAWN BY: 345601
FIO2: 0.3 %
LHR: 12 {breaths}/min
MECHVT: 600 mL
O2 Saturation: 95 %
PCO2 ART: 30.4 mmHg — AB (ref 35.0–45.0)
PEEP/CPAP: 5 cmH2O
Patient temperature: 99.5
TCO2: 15.5 mmol/L (ref 0–100)
pH, Arterial: 7.368 (ref 7.350–7.450)
pO2, Arterial: 79 mmHg — ABNORMAL LOW (ref 80.0–100.0)

## 2014-08-14 LAB — COMPREHENSIVE METABOLIC PANEL
ALK PHOS: 240 U/L — AB (ref 38–126)
ALT: 393 U/L — ABNORMAL HIGH (ref 17–63)
AST: 1001 U/L — AB (ref 15–41)
Albumin: 1.9 g/dL — ABNORMAL LOW (ref 3.5–5.0)
Anion gap: 6 (ref 5–15)
BILIRUBIN TOTAL: 2.6 mg/dL — AB (ref 0.3–1.2)
BUN: 51 mg/dL — AB (ref 6–20)
CO2: 18 mmol/L — AB (ref 22–32)
CREATININE: 1.85 mg/dL — AB (ref 0.61–1.24)
Calcium: 6.9 mg/dL — ABNORMAL LOW (ref 8.9–10.3)
Chloride: 115 mmol/L — ABNORMAL HIGH (ref 101–111)
GFR calc Af Amer: 43 mL/min — ABNORMAL LOW (ref >60)
GFR calc non Af Amer: 37 mL/min — ABNORMAL LOW (ref >60)
Glucose, Bld: 144 mg/dL — ABNORMAL HIGH (ref 65–99)
Potassium: 3.8 mmol/L (ref 3.5–5.1)
SODIUM: 139 mmol/L (ref 135–145)
Total Protein: 5.2 g/dL — ABNORMAL LOW (ref 6.5–8.1)

## 2014-08-14 LAB — CBC
HEMATOCRIT: 31.3 % — AB (ref 39.0–52.0)
Hemoglobin: 11.3 g/dL — ABNORMAL LOW (ref 13.0–17.0)
MCH: 30.9 pg (ref 26.0–34.0)
MCHC: 36.1 g/dL — AB (ref 30.0–36.0)
MCV: 85.5 fL (ref 78.0–100.0)
PLATELETS: 134 10*3/uL — AB (ref 150–400)
RBC: 3.66 MIL/uL — ABNORMAL LOW (ref 4.22–5.81)
RDW: 16.9 % — AB (ref 11.5–15.5)
WBC: 16.1 10*3/uL — ABNORMAL HIGH (ref 4.0–10.5)

## 2014-08-14 LAB — GLUCOSE, CAPILLARY
GLUCOSE-CAPILLARY: 127 mg/dL — AB (ref 65–99)
GLUCOSE-CAPILLARY: 131 mg/dL — AB (ref 65–99)
GLUCOSE-CAPILLARY: 93 mg/dL (ref 65–99)
Glucose-Capillary: 125 mg/dL — ABNORMAL HIGH (ref 65–99)
Glucose-Capillary: 108 mg/dL — ABNORMAL HIGH (ref 65–99)
Glucose-Capillary: 143 mg/dL — ABNORMAL HIGH (ref 65–99)

## 2014-08-14 LAB — MAGNESIUM: MAGNESIUM: 1.9 mg/dL (ref 1.7–2.4)

## 2014-08-14 LAB — LACTIC ACID, PLASMA: Lactic Acid, Venous: 2 mmol/L (ref 0.5–2.0)

## 2014-08-14 LAB — T-HELPER CELLS (CD4) COUNT (NOT AT ARMC)
CD4 T CELL ABS: 590 /uL (ref 400–2700)
CD4 T CELL HELPER: 46 % (ref 33–55)

## 2014-08-14 LAB — TSH: TSH: 0.439 u[IU]/mL (ref 0.350–4.500)

## 2014-08-14 LAB — PHOSPHORUS: Phosphorus: 3.9 mg/dL (ref 2.5–4.6)

## 2014-08-14 LAB — TROPONIN I: Troponin I: >65 ng/mL (ref <0.031)

## 2014-08-14 LAB — PROTIME-INR
INR: 1.46 (ref 0.00–1.49)
Prothrombin Time: 17.8 seconds — ABNORMAL HIGH (ref 11.6–15.2)

## 2014-08-14 MED ORDER — ETHANOLAMINE OLEATE 5 % IV SOLN
INTRAVENOUS | Status: AC
  Filled 2014-08-14: qty 6

## 2014-08-14 MED ORDER — LEVOFLOXACIN IN D5W 750 MG/150ML IV SOLN
750.0000 mg | INTRAVENOUS | Status: DC
  Administered 2014-08-14 – 2014-08-16 (×2): 750 mg via INTRAVENOUS
  Filled 2014-08-14 (×2): qty 150

## 2014-08-14 MED ORDER — METRONIDAZOLE IVPB CUSTOM
250.0000 mg | Freq: Three times a day (TID) | INTRAVENOUS | Status: DC
  Administered 2014-08-14 – 2014-08-21 (×22): 250 mg via INTRAVENOUS
  Filled 2014-08-14: qty 100
  Filled 2014-08-14 (×3): qty 50
  Filled 2014-08-14 (×2): qty 100
  Filled 2014-08-14 (×3): qty 50
  Filled 2014-08-14 (×2): qty 100
  Filled 2014-08-14 (×2): qty 50
  Filled 2014-08-14: qty 100
  Filled 2014-08-14 (×2): qty 50
  Filled 2014-08-14: qty 100
  Filled 2014-08-14 (×14): qty 50
  Filled 2014-08-14: qty 100
  Filled 2014-08-14 (×2): qty 50

## 2014-08-14 NOTE — Progress Notes (Signed)
Goals of care discussion completed w/ patients brother. They understand current situation and prognosis.   Plan Continue current rx DNR  Erick Colace ACNP-BC [place] Pager # [phone] OR # [phone] if no answer

## 2014-08-14 NOTE — Progress Notes (Signed)
*  PRELIMINARY RESULTS* Echocardiogram 2D Echocardiogram has been performed.  Leavy Cella 08/14/2014, 10:47 AM

## 2014-08-14 NOTE — Progress Notes (Signed)
PULMONARY / CRITICAL CARE MEDICINE   Name: EBAN WEICK MRN: [id number] DOB: Jun 06, 1950    ADMISSION DATE:  08/12/2014 CONSULTATION DATE:  08/12/2014   REFERRING MD :  EDP  CHIEF COMPLAINT:  Hematemesis  INITIAL PRESENTATION: 64 year old male with PMH of HIV, HCV and etoh abuse, liver failure, ascites who was recently admitted to [facility] with SOB and was noted to have a portal vein thrombosis and liver mass consistent with hepatocellular carcinoma.  He was discharged on coumadin.  Returns to the hospital with hematemesis and melena.  Lactic acid was noted to be 8.39 and Hg dropped from 10.2 3 days ago to 7.7. He was transfused and given volume with minimal improvement.  STUDIES:  7/11 CT cervial Spine wo contrast: no acute injury; multilevel degenerative disc disease  SIGNIFICANT EVENTS: 7/5-7/8 > admission for CAP, cirrhosis, portal vein thrombosis 7/11 > Re-admit, GI consulted, vitK and [facility] admin in ED.  Intubated.  EGD with varices noted s/p banding x 2, injection with epinephrine, and placement of 4 clips.  Trop bump with inferior ST elevations. 7/12:.  Required intubation 7/12 prior to EGD which revealed distal varix noted s/p banding x 2, injection with epinephrine, and placement of 4 clips.  Later that PM, began to have agitation so bolused with fentanyl.  Shortly thereafter, became profoundly hypotensive with SBP in 50's despite multiple IVF boluses.Course further complicated by ST changes noted on monitor by RN and troponin bump of 7.43 7/13 still in shock. hgb holding, Trop > 65.    VITAL SIGNS: Temp:  [94.1 F (34.5 C)-99.9 F (37.7 C)] 99.7 F (37.6 C) (07/13 0800) Pulse Rate:  [63-102] 63 (07/13 0804) Resp:  [12-21] 13 (07/13 0804) BP: (61-146)/(37-85) 106/54 mmHg (07/13 0804) SpO2:  [70 %-100 %] 100 % (07/13 0804) Arterial Line BP: (60-136)/(47-106) 94/55 mmHg (07/13 0800) FiO2 (%):  [30 %-50 %] 30 % (07/13 0804) Weight:  [60.8 kg (134 lb 0.6 oz)] 60.8 kg (134 lb  0.6 oz) (07/12 1458) INTAKE / OUTPUT:  Intake/Output Summary (Last 24 hours) at 08/14/14 0935 Last data filed at 08/14/14 0800  Gross per 24 hour  Intake 7676.73 ml  Output   3300 ml  Net 4376.73 ml    PHYSICAL EXAMINATION: General:  Chronically ill appearing male, emaciated, intubated. Sedated  Neuro:  Sedated, does not follow commands. HEENT:  [place]/AT, PERRL.  ETT in place. Cardiovascular:  Heart sounds distant, no M/R/G appreciated. Lungs:  CTA bilaterally. Abdomen:  BS x 4, Soft, NT/ND. Musculoskeletal:  No edema and no tenderness.. Skin:  Multiple wounds covered in drsg on BUE and BLE (on admission, pt attributed them to dog scratches).  LABS:  CBC  Recent Labs Lab 08/13/14 0915 08/13/14 1820 08/13/14 2225 08/14/14 0415  WBC 14.9* 14.2*  --  16.1*  HGB 9.9* 9.9* 10.6* 11.3*  HCT 29.0* 29.1* 30.2* 31.3*  PLT 239 110*  --  134*   Coag's  Recent Labs Lab 08/08/14 1239  08/13/14 0600 08/13/14 1820 08/14/14 0415  APTT 43*  --   --   --   --   INR 1.36  < > 1.41 1.53* 1.46  < > = values in this interval not displayed.   BMET  Recent Labs Lab 08/13/14 0343 08/13/14 1945 08/14/14 0415  NA 136 137 139  K 4.2 3.8 3.8  CL 110 114* 115*  CO2 19* 16* 18*  BUN 35* 45* 51*  CREATININE 1.74* 1.91* 1.85*  GLUCOSE 149* 200* 144*  Electrolytes  Recent Labs Lab 08/13/14 0343 08/13/14 1945 08/14/14 0415  CALCIUM 7.8* 6.5* 6.9*  MG 2.2  --  1.9  PHOS 5.8*  --  3.9   Sepsis Markers  Recent Labs Lab 08/12/14 1741 08/13/14 0343 08/13/14 2315  LATICACIDVEN 8.34* 1.9 2.0    Recent Labs Lab 08/13/14 1825 08/14/14 0055 08/14/14 0610  TROPONINI 7.43* >65.00* >65.00*    Liver Enzymes  Recent Labs Lab 08/13/14 0343 08/13/14 1945 08/14/14 0415  AST 805* 884* 1001*  ALT 290* 317* 393*  ALKPHOS 196* 189* 240*  BILITOT 2.3* 2.2* 2.6*  ALBUMIN 2.1* 1.7* 1.9*   Glucose  Recent Labs Lab 08/13/14 0003 08/13/14 1902 08/13/14 2003  08/13/14 2339 08/14/14 0410 08/14/14 0749  GLUCAP 149* 173* 211* 131* 125* 127*    Imaging Dg Chest Port 1 View  08/14/2014   CLINICAL DATA:  Hypoxia  EXAM: PORTABLE CHEST - 1 VIEW  COMPARISON:  August 13, 2014  FINDINGS: Endotracheal tube tip is 5.2 cm above the carina. Central catheter tip is in the superior vena cava. No pneumothorax. There is persistent patchy atelectatic change in the base regions, slightly less on the left and largely unchanged on the right. Lungs elsewhere are clear. Heart size and pulmonary vascularity are normal. No adenopathy. No bone lesions. Scattered foci of calcification are noted in each carotid artery.  IMPRESSION: Atelectasis in each lower lobe region. Tube and catheter positions as described without pneumothorax.   Electronically Signed   By: Lowella Grip III M.D.   On: 08/14/2014 07:03   Dg Chest Port 1 View  08/13/2014   CLINICAL DATA:  Central line placement.  EXAM: PORTABLE CHEST - 1 VIEW  COMPARISON:  08/13/2014  FINDINGS: The endotracheal tube is 5 cm above the carina. There is a right IJ central venous Cordis with its tip in the mid SVC. No complicating features. Persistent areas of subsegmental atelectasis at the lung bases but no edema or effusions.  IMPRESSION: Right IJ central venous Cordis in good position without complicating features.  Endotracheal tube is 5 cm above the carina.  Bibasilar atelectasis but infiltrates or effusions.   Electronically Signed   By: Marijo Sanes M.D.   On: 08/13/2014 15:23   Bilateral atx, elevated RHD, improved patchy airspace disease specifically r>l  ASSESSMENT / PLAN:  PULMONARY OETT 7/12 >>> A: VDRF - in setting acute UGIB due to varices. P:   Full vent support. Wean as able. Hold SBT until decision on repeat EGD confirmed - would keep intubated if GI planning to repeat EGD. CXR in AM.  CARDIOVASCULAR R IJ cordis 7/12 >>> A:  Shock - likely combination of hypovolemic / hemorrhagic in setting GI bleed  as well as possible cardiogenic in setting acute MI. Acute inferior STEMI - does have some inferior Q waves so ? chronicity. Elevated lactic acid > cleared (7/12 level 1.9). Plan: Levophed as needed to maintain goal MAP > 65. Heparin / ASA contraindicated given acute GI hemorrhage. Beta blocker restricted due to shock. Trend troponins. Discussed with cards (Dr. Claiborne Billings on 7/12) - no further interventions at this time. Hold lasix/spironolactone.  RENAL A:   NAG metabolic acidosis: likely d/t NaCl resuscitation and renal dz Acute on chronic renal failure w/ scr improving some currently  Hyperchloremia  P:   Cont bicarb gtt, serial chemistries Change to LR once bicarb > 20  Replace lytes as needed   GASTROINTESTINAL A:   UGIB due to varices - s/p banding x 2,  injection with epinephrine, and placement of 4 clips (Dr. Watt Climes). Transaminitis - due to liver failure and now shock liver  Portal vein thrombosis. Liver mass consistent with hepatocellular carcinoma (not candidate for resection/ablation). P:   Continue PPI, octreotide. GI following. Monitor for further signs of bleeding. Trend LFTs. NPO.  HEMATOLOGIC A:   Acute blood loss anemia - due to varices. Coagulopathy - due to warfarin +/- cirrhosis. P: Transfuse for Hgb < 8 given troponin bump and ST changes. Hold coumadin/lovenox. H/H q6hrs.  INFECTIOUS A:   Recent CAP. HIV/AIDS, HCV. P:   Levaquin from prior admit. Last day 7/12. Follow WBC and fever curve. Followed by Dr. Megan Salon ID; ID notified 7/12 - Dr. Johnnye Sima. Continue HAART per ID. Levaquin >>> stop 7/12.  ENDOCRINE A:   Hyperglycemia - no hx DM. P:   SSI. Check TSH.  NEUROLOGIC A:   Acute metabolic encephalopathy. P:   Sedation:  Precedex gtt / Fentanyl PRN. RASS Goal:  0 to -1. Daily WUA.   Critically ill, MODS on setting of hemorrhagic shock and subsequent NSTEMI and likely cardiogenic shock. Currently metabolic status stable. Weaning  pressors. Worried however about prognosis as GIB prevents much in the way of treating his Acute MI. Will speak to family as we need to address goals of care.   Erick Colace ACNP-BC [place] Pager # [phone] OR # [phone] if no answer  Reviewed above, examined.  Events of last night noted.    He has STEMI in setting of upper GI bleeding from varices.  He has respiratory failure related to this and metabolic demand.  He has baseline cirrhosis and hepatocellular carcinoma.    He is intubated, on pressors, RASS -3, heart rate regular, basilar crackles, mild abdominal distension, decreased muscle bulk.  Continue vent support, pressors, IV fluids, f/u Hb.  Defer cardiology assessment for now since likely from demand ischemia.    His prognosis is quite poor >> d/w pt's family.  They would like to continue current medical interventions.  However, they would not want cardiac resuscitation in event of cardiac arrest >> DNR order placed.  CC time by me independent of APP time is 40 minutes.  Chesley Mires, MD [facility] Pulmonary/Critical Care 08/14/2014, 11:50 AM Pager:  [phone] After 3pm call: [phone]

## 2014-08-14 NOTE — Progress Notes (Signed)
Initial Nutrition Assessment  DOCUMENTATION CODES:   Underweight  INTERVENTION:   If medically feasible, diet advancement per MD  If pt is expected to be intubated for >24 hours, recommend nutrition support. TF recommendations: Vital 1.5 @ 10 ml/hr and increase by 10 ml every 4 hours to goal rate of 50 ml/hr.   Tube feeding regimen provides 1800 kcal (100% of needs), 81 grams of protein, and 917 ml of H2O.   RD to continue to monitor  NUTRITION DIAGNOSIS:   Increased nutrient needs related to chronic illness as evidenced by estimated needs.  GOAL:   Patient will meet greater than or equal to 90% of their needs  MONITOR:   Vent status, Labs, Weight trends, Skin, I & O's  REASON FOR ASSESSMENT:   Ventilator    ASSESSMENT:   64 year old male with PMH of HIV, HCV and etoh abuse, liver failure, ascites who was recently admitted to [facility] with SOB and was noted to have a portal vein thrombosis and liver mass consistent with hepatocellular carcinoma.   MV: 7.7 L/min Temp (24hrs), Avg:98.2 F (36.8 C), Min:94.1 F (34.5 C), Max:100.2 F (37.9 C)  Propofol: none  Pt with mild to moderate fat depletion, mild to moderate muscle depletion, and no edema.  Labs reviewed: Elevated BUN & Creatinine  Diet Order:  Diet NPO time specified  Skin:  Reviewed, no issues  Last BM:  7/13  Height:   Ht Readings from Last 1 Encounters:  08/13/14 5\' 9"  (1.753 m)    Weight:   Wt Readings from Last 1 Encounters:  08/13/14 134 lb 0.6 oz (60.8 kg)    Ideal Body Weight:  72.7 kg  Wt Readings from Last 10 Encounters:  08/13/14 134 lb 0.6 oz (60.8 kg)  08/06/14 122 lb 2.2 oz (55.4 kg)  03/26/14 123 lb 12 oz (56.133 kg)  09/11/13 122 lb 4 oz (55.452 kg)  07/23/13 125 lb (56.7 kg)  01/06/13 140 lb (63.504 kg)  05/23/12 147 lb 8 oz (66.906 kg)  04/04/12 136 lb 8 oz (61.916 kg)  02/09/12 136 lb (61.689 kg)  07/27/11 129 lb 8 oz (58.741 kg)    BMI:  Body mass index is 19.79  kg/(m^2).  Estimated Nutritional Needs: based on admission weight (54.1 kg)  Kcal:  1650-1850  Protein:  80-90g  Fluid:  1.8L/day  EDUCATION NEEDS:   No education needs identified at this time  Clayton Bibles, MS, RD, LDN Pager: [phone] After Hours Pager: [phone]

## 2014-08-14 NOTE — Progress Notes (Signed)
Stanislav Pollyann Kennedy 3:59 PM  Subjective: Patient intubated and sedated but no signs of further bleeding and I had a long talk with multiple family members and all of their questions were answered  Objective: Occasional temperature spikes other vital signs stable exam pertinent for minimal ascites rare bowel sounds nontender labs pertinent for stable hemoglobin but increased troponins BUN and creatinine and liver tests all compatible with hypoxic and hypotensive damage  Assessment: Multiple medical problems  Plan: Per ICU team can allow to wake up and wean ventilator per their routine and no reason to proceed with repeat endoscopy today at this time but happy to relook in the future if signs of recurrent bleeding and will continue to follow [facility]  Pager [phone] After 5PM or if no answer call [phone]

## 2014-08-14 NOTE — Progress Notes (Addendum)
INFECTIOUS DISEASE PROGRESS NOTE  ID: MORT SMELSER is a 64 y.o. male with  Principal Problem:   Upper GI bleed Active Problems:   Human immunodeficiency virus (HIV) disease   Alcohol consumption heavy   Liver mass   Leukocytosis   Anemia of chronic disease   ARF (acute renal failure)   HTN (hypertension), benign   Hepatitis C   Protein-calorie malnutrition, severe   Acute posthemorrhagic anemia   Endotracheal tube present   Hematemesis with nausea  Subjective: On vent, no questions from family.   Abtx:  Anti-infectives    Start     Dose/Rate Route Frequency Ordered Stop   08/15/14 1300  emtricitabine-tenofovir (TRUVADA) 200-300 MG per tablet 1 tablet     1 tablet Per Tube Every 48 hours 08/13/14 2225     08/13/14 1330  ciprofloxacin (CIPRO) IVPB 400 mg  Status:  Discontinued     400 mg 200 mL/hr over 60 Minutes Intravenous  Once 08/13/14 1321 08/13/14 1500   08/13/14 1315  ciprofloxacin (CIPRO) IVPB 400 mg  Status:  Discontinued     400 mg 200 mL/hr over 60 Minutes Intravenous Every 12 hours 08/13/14 1313 08/13/14 1321   08/13/14 1300  dolutegravir (TIVICAY) tablet 50 mg     50 mg Oral Daily 08/13/14 1152     08/13/14 1300  emtricitabine-tenofovir (TRUVADA) 200-300 MG per tablet 1 tablet  Status:  Discontinued     1 tablet Oral Every 48 hours 08/13/14 1158 08/13/14 2225   08/13/14 1200  emtricitabine-tenofovir (TRUVADA) 200-300 MG per tablet 1 tablet  Status:  Discontinued     1 tablet Oral Daily 08/13/14 1152 08/13/14 1157      Medications:  Scheduled: . antiseptic oral rinse  7 mL Mouth Rinse QID  . chlorhexidine  15 mL Mouth Rinse BID  . dolutegravir  50 mg Oral Daily  . [START ON 08/15/2014] emtricitabine-tenofovir  1 tablet Per Tube Q48H  . insulin aspart  0-15 Units Subcutaneous 6 times per day  . [START ON 08/16/2014] pantoprazole (PROTONIX) IV  40 mg Intravenous Q12H    Objective: Vital signs in last 24 hours: Temp:  [94.8 F (34.9 C)-101.1 F (38.4  C)] 101.1 F (38.4 C) (07/13 1500) Pulse Rate:  [63-81] 64 (07/13 1149) Resp:  [12-21] 15 (07/13 1500) BP: (61-146)/(46-85) 95/53 mmHg (07/13 1149) SpO2:  [79 %-100 %] 100 % (07/13 1500) Arterial Line BP: (60-136)/(47-106) 106/58 mmHg (07/13 1500) FiO2 (%):  [30 %-40 %] 30 % (07/13 1200)   General appearance: no distress and pale Resp: rhonchi bilaterally Cardio: regular rate and rhythm GI: normal findings: bowel sounds normal and soft, non-tender  Lab Results  Recent Labs  08/13/14 1820 08/13/14 1945 08/13/14 2225 08/14/14 0415  WBC 14.2*  --   --  16.1*  HGB 9.9*  --  10.6* 11.3*  HCT 29.1*  --  30.2* 31.3*  NA  --  137  --  139  K  --  3.8  --  3.8  CL  --  114*  --  115*  CO2  --  16*  --  18*  BUN  --  45*  --  51*  CREATININE  --  1.91*  --  1.85*   Liver Panel  Recent Labs  08/13/14 1945 08/14/14 0415  PROT 4.6* 5.2*  ALBUMIN 1.7* 1.9*  AST 884* 1001*  ALT 317* 393*  ALKPHOS 189* 240*  BILITOT 2.2* 2.6*    Sedimentation Rate No results  for input(s): ESRSEDRATE in the last 72 hours. C-Reactive Protein No results for input(s): CRP in the last 72 hours.  Microbiology: Recent Results (from the past 240 hour(s))  Blood culture (routine x 2)     Status: None   Collection Time: 08/06/14 12:03 AM  Result Value Ref Range Status   Specimen Description RIGHT ANTECUBITAL  Final   Special Requests BOTTLES DRAWN AEROBIC AND ANAEROBIC 10CC  Final   Culture NO GROWTH 5 DAYS  Final   Report Status 08/11/2014 FINAL  Final  Blood culture (routine x 2)     Status: None   Collection Time: 08/06/14 12:10 AM  Result Value Ref Range Status   Specimen Description LEFT ANTECUBITAL  Final   Special Requests BOTTLES DRAWN AEROBIC ONLY 10CC  Final   Culture NO GROWTH 5 DAYS  Final   Report Status 08/11/2014 FINAL  Final  Gram stain     Status: None   Collection Time: 08/06/14  4:10 PM  Result Value Ref Range Status   Specimen Description FLUID PERITONEAL  Final    Special Requests NONE  Final   Gram Stain   Final    MODERATE WBC PRESENT,BOTH PMN AND MONONUCLEAR NO ORGANISMS SEEN    Report Status 08/06/2014 FINAL  Final  Culture, body fluid-bottle     Status: None   Collection Time: 08/06/14  4:10 PM  Result Value Ref Range Status   Specimen Description FLUID PERITONEAL  Final   Special Requests NONE  Final   Culture NO GROWTH 5 DAYS  Final   Report Status 08/11/2014 FINAL  Final  MRSA PCR Screening     Status: None   Collection Time: 08/12/14  9:36 PM  Result Value Ref Range Status   MRSA by PCR NEGATIVE NEGATIVE Final    Comment:        The GeneXpert MRSA Assay (FDA approved for NASAL specimens only), is one component of a comprehensive MRSA colonization surveillance program. It is not intended to diagnose MRSA infection nor to guide or monitor treatment for MRSA infections.     Studies/Results: Ct Cervical Spine Wo Contrast  08/12/2014   CLINICAL DATA:  64 year old male with history of trauma from a fall this morning. Cervical spine clearance prior to ICU admission.  EXAM: CT CERVICAL SPINE WITHOUT CONTRAST  TECHNIQUE: Multidetector CT imaging of the cervical spine was performed without intravenous contrast. Multiplanar CT image reconstructions were also generated.  COMPARISON:  No priors.  FINDINGS: No acute displaced cervical spine fracture. Alignment is anatomic. Prevertebral soft tissues are normal. Multilevel degenerative disc disease, most severe at C6-C7. Mild multilevel facet arthropathy. Visualized portions of the upper thorax demonstrate extensive pleuroparenchymal thickening, most compatible with chronic post infectious or inflammatory scarring. Several small calcified granulomas are also incidentally noted.  IMPRESSION: 1. No evidence of significant acute traumatic injury to the cervical spine. 2. Mild multilevel degenerative disc disease and cervical spondylosis, as above.   Electronically Signed   By: Vinnie Langton M.D.   On:  08/12/2014 21:20   Dg Chest Port 1 View  08/14/2014   CLINICAL DATA:  Hypoxia  EXAM: PORTABLE CHEST - 1 VIEW  COMPARISON:  August 13, 2014  FINDINGS: Endotracheal tube tip is 5.2 cm above the carina. Central catheter tip is in the superior vena cava. No pneumothorax. There is persistent patchy atelectatic change in the base regions, slightly less on the left and largely unchanged on the right. Lungs elsewhere are clear. Heart size and pulmonary vascularity  are normal. No adenopathy. No bone lesions. Scattered foci of calcification are noted in each carotid artery.  IMPRESSION: Atelectasis in each lower lobe region. Tube and catheter positions as described without pneumothorax.   Electronically Signed   By: Lowella Grip III M.D.   On: 08/14/2014 07:03   Dg Chest Port 1 View  08/13/2014   CLINICAL DATA:  Central line placement.  EXAM: PORTABLE CHEST - 1 VIEW  COMPARISON:  08/13/2014  FINDINGS: The endotracheal tube is 5 cm above the carina. There is a right IJ central venous Cordis with its tip in the mid SVC. No complicating features. Persistent areas of subsegmental atelectasis at the lung bases but no edema or effusions.  IMPRESSION: Right IJ central venous Cordis in good position without complicating features.  Endotracheal tube is 5 cm above the carina.  Bibasilar atelectasis but infiltrates or effusions.   Electronically Signed   By: Marijo Sanes M.D.   On: 08/13/2014 15:23   Dg Chest Port 1 View  08/13/2014   CLINICAL DATA:  Hypoxia.  EXAM: PORTABLE CHEST - 1 VIEW  COMPARISON:  08/05/2014.  FINDINGS: Mediastinum hilar structures stable. Slight improvement of right base subsegmental atelectasis/infiltrate. Mild left mid lung field subsegmental atelectasis. No pleural effusion or pneumothorax. Heart size normal.  IMPRESSION: 1. Slight improvement of right base subsegmental atelectasis and or infiltrate. 2. Mild left mid lung field subsegmental atelectasis.   Electronically Signed   By: Marcello Moores   Register   On: 08/13/2014 07:08     Assessment/Plan: HIV+ Hematemesis, Esophageal Varicies, Coumadin Cirrhosis due to   HCV (prev interferon tx)   ETOH abuse Portal Vein thrombosis (08-05-14) ARF (1.14 -> 1.74) +Troponin VDRF Protein Calorie Malnutrition  Total days of antibiotics: 0 TRV/DTGV  Febrile, WBC up slightly. CXR atx.  Cr slightly better LFTs worsening H/h stable troponins remain elevated No change in ART Will add levaquin/flagyl. PEN allergy (anaphylaxis) noted.  CCM note regarding family meeting noted.          [name] Infectious Diseases (pager) [phone] [email or web address] 08/14/2014, 3:35 PM  LOS: 2 days

## 2014-08-14 NOTE — Progress Notes (Signed)
ANTIBIOTIC CONSULT NOTE - INITIAL  Pharmacy Consult for Levaquin Indication: Intra-abdominal Infection  Allergies  Allergen Reactions  . Penicillins Anaphylaxis  . Codeine Hives and Itching    Patient Measurements: Height: 5\' 9"  (175.3 cm) Weight: 134 lb 0.6 oz (60.8 kg) IBW/kg (Calculated) : 70.7   Vital Signs: Temp: 101.5 F (38.6 C) (07/13 1627) Temp Source: Core (Comment) (07/13 1600) BP: 102/55 mmHg (07/13 1542) Pulse Rate: 68 (07/13 1542) Intake/Output from previous day: 07/12 0701 - 07/13 0700 In: 7486.9 [I.V.:5374.9; Blood:1112; IV Piggyback:1000] Out: 3175 [Urine:975; Blood:2200] Intake/Output from this shift: Total I/O In: 1709.8 [I.V.:1709.8] Out: 875 [Urine:875]  Labs:  Recent Labs  08/13/14 0343 08/13/14 0915 08/13/14 1820 08/13/14 1945 08/13/14 2225 08/14/14 0415  WBC 13.1* 14.9* 14.2*  --   --  16.1*  HGB 10.1* 9.9* 9.9*  --  10.6* 11.3*  PLT 257 239 110*  --   --  134*  CREATININE 1.74*  --   --  1.91*  --  1.85*   Estimated Creatinine Clearance: 35.1 mL/min (by C-G formula based on Cr of 1.85). No results for input(s): VANCOTROUGH, VANCOPEAK, VANCORANDOM, GENTTROUGH, GENTPEAK, GENTRANDOM, TOBRATROUGH, TOBRAPEAK, TOBRARND, AMIKACINPEAK, AMIKACINTROU, AMIKACIN in the last 72 hours.   Microbiology: Recent Results (from the past 720 hour(s))  Blood culture (routine x 2)     Status: None   Collection Time: 08/06/14 12:03 AM  Result Value Ref Range Status   Specimen Description RIGHT ANTECUBITAL  Final   Special Requests BOTTLES DRAWN AEROBIC AND ANAEROBIC 10CC  Final   Culture NO GROWTH 5 DAYS  Final   Report Status 08/11/2014 FINAL  Final  Blood culture (routine x 2)     Status: None   Collection Time: 08/06/14 12:10 AM  Result Value Ref Range Status   Specimen Description LEFT ANTECUBITAL  Final   Special Requests BOTTLES DRAWN AEROBIC ONLY 10CC  Final   Culture NO GROWTH 5 DAYS  Final   Report Status 08/11/2014 FINAL  Final  Gram stain      Status: None   Collection Time: 08/06/14  4:10 PM  Result Value Ref Range Status   Specimen Description FLUID PERITONEAL  Final   Special Requests NONE  Final   Gram Stain   Final    MODERATE WBC PRESENT,BOTH PMN AND MONONUCLEAR NO ORGANISMS SEEN    Report Status 08/06/2014 FINAL  Final  Culture, body fluid-bottle     Status: None   Collection Time: 08/06/14  4:10 PM  Result Value Ref Range Status   Specimen Description FLUID PERITONEAL  Final   Special Requests NONE  Final   Culture NO GROWTH 5 DAYS  Final   Report Status 08/11/2014 FINAL  Final  MRSA PCR Screening     Status: None   Collection Time: 08/12/14  9:36 PM  Result Value Ref Range Status   MRSA by PCR NEGATIVE NEGATIVE Final    Comment:        The GeneXpert MRSA Assay (FDA approved for NASAL specimens only), is one component of a comprehensive MRSA colonization surveillance program. It is not intended to diagnose MRSA infection nor to guide or monitor treatment for MRSA infections.     Medical History: Past Medical History  Diagnosis Date  . HIV (human immunodeficiency virus infection)   . Hepatitis C     Medications:  Scheduled:  . antiseptic oral rinse  7 mL Mouth Rinse QID  . chlorhexidine  15 mL Mouth Rinse BID  . dolutegravir  50 mg Oral Daily  . [START ON 08/15/2014] emtricitabine-tenofovir  1 tablet Per Tube Q48H  . insulin aspart  0-15 Units Subcutaneous 6 times per day  . levofloxacin (LEVAQUIN) IV  750 mg Intravenous Q48H  . metronidazole  250 mg Intravenous Q8H  . [START ON 08/16/2014] pantoprazole (PROTONIX) IV  40 mg Intravenous Q12H   Infusions:  . sodium chloride 1,000 mL (08/13/14 2047)  . dexmedetomidine 0.7 mcg/kg/hr (08/14/14 1617)  . norepinephrine (LEVOPHED) Adult infusion 10 mcg/min (08/14/14 1340)  . octreotide  (SANDOSTATIN)    IV infusion 50 mcg/hr (08/14/14 0947)  . pantoprozole (PROTONIX) infusion 8 mg/hr (08/14/14 0947)  .  sodium bicarbonate 150 mEq in sterile water  1000 mL infusion 100 mL/hr at 08/14/14 0918  . vasopressin (PITRESSIN) infusion - *FOR SHOCK* 0.03 Units/min (08/14/14 0002)   PRN: albuterol, fentaNYL (SUBLIMAZE) injection, fentaNYL (SUBLIMAZE) injection, ondansetron **OR** ondansetron (ZOFRAN) IV Assessment: 63yoM with HIV, HCV, ETOh abuse, liver failure and ascites who was recently admitted to [facility] with SOB and was noted to have a liver mass consistent with hepatocellular carcinoma. He discharged on Coumadin for portal vein thrombosis. He returns to the ER with hematemesis and melena. Vit K and Kcentra given in ED. Patient was intubated on 7/12 prior to EGD. In the afternoon, pt became profoundly hypotensive. Levaquin to be started for intra-abdominal infection. Pt is also on concurrent Flagyl.  Goal of Therapy:  Eradication of infection  Plan:  Will start Levaquin 750mg  IV Q48H for Clcr of 35 ml/min. Will f/u Scr and adjust dose as needed.  Garnet Sierras 08/14/2014,4:31 PM

## 2014-08-15 DIAGNOSIS — K7031 Alcoholic cirrhosis of liver with ascites: Principal | ICD-10-CM

## 2014-08-15 DIAGNOSIS — I5030 Unspecified diastolic (congestive) heart failure: Secondary | ICD-10-CM

## 2014-08-15 LAB — COMPREHENSIVE METABOLIC PANEL
ALBUMIN: 1.9 g/dL — AB (ref 3.5–5.0)
ALK PHOS: 299 U/L — AB (ref 38–126)
ALT: 468 U/L — ABNORMAL HIGH (ref 17–63)
AST: 980 U/L — ABNORMAL HIGH (ref 15–41)
Anion gap: 10 (ref 5–15)
BUN: 47 mg/dL — ABNORMAL HIGH (ref 6–20)
CALCIUM: 6.8 mg/dL — AB (ref 8.9–10.3)
CO2: 23 mmol/L (ref 22–32)
Chloride: 107 mmol/L (ref 101–111)
Creatinine, Ser: 1.55 mg/dL — ABNORMAL HIGH (ref 0.61–1.24)
GFR calc Af Amer: 53 mL/min — ABNORMAL LOW (ref >60)
GFR, EST NON AFRICAN AMERICAN: 46 mL/min — AB (ref >60)
Glucose, Bld: 133 mg/dL — ABNORMAL HIGH (ref 65–99)
Potassium: 2.7 mmol/L — CL (ref 3.5–5.1)
Sodium: 140 mmol/L (ref 135–145)
TOTAL PROTEIN: 5.2 g/dL — AB (ref 6.5–8.1)
Total Bilirubin: 2.7 mg/dL — ABNORMAL HIGH (ref 0.3–1.2)

## 2014-08-15 LAB — CBC
HCT: 29.7 % — ABNORMAL LOW (ref 39.0–52.0)
Hemoglobin: 10.8 g/dL — ABNORMAL LOW (ref 13.0–17.0)
MCH: 31.1 pg (ref 26.0–34.0)
MCHC: 36.4 g/dL — AB (ref 30.0–36.0)
MCV: 85.6 fL (ref 78.0–100.0)
Platelets: 117 10*3/uL — ABNORMAL LOW (ref 150–400)
RBC: 3.47 MIL/uL — ABNORMAL LOW (ref 4.22–5.81)
RDW: 17.7 % — AB (ref 11.5–15.5)
WBC: 15.8 10*3/uL — ABNORMAL HIGH (ref 4.0–10.5)

## 2014-08-15 LAB — GLUCOSE, CAPILLARY
GLUCOSE-CAPILLARY: 120 mg/dL — AB (ref 65–99)
GLUCOSE-CAPILLARY: 129 mg/dL — AB (ref 65–99)
GLUCOSE-CAPILLARY: 124 mg/dL — AB (ref 65–99)
Glucose-Capillary: 130 mg/dL — ABNORMAL HIGH (ref 65–99)
Glucose-Capillary: 144 mg/dL — ABNORMAL HIGH (ref 65–99)
Glucose-Capillary: 150 mg/dL — ABNORMAL HIGH (ref 65–99)

## 2014-08-15 LAB — PROTIME-INR
INR: 1.66 — ABNORMAL HIGH (ref 0.00–1.49)
Prothrombin Time: 19.6 seconds — ABNORMAL HIGH (ref 11.6–15.2)

## 2014-08-15 LAB — CORTISOL: Cortisol, Plasma: 36.7 ug/dL

## 2014-08-15 LAB — LIPASE, BLOOD: Lipase: 25 U/L (ref 22–51)

## 2014-08-15 LAB — AMYLASE: Amylase: 29 U/L (ref 28–100)

## 2014-08-15 MED ORDER — ALBUMIN HUMAN 5 % IV SOLN
12.5000 g | Freq: Once | INTRAVENOUS | Status: AC
  Administered 2014-08-15: 12.5 g via INTRAVENOUS
  Filled 2014-08-15: qty 250

## 2014-08-15 MED ORDER — SODIUM CHLORIDE 0.9 % IV SOLN
INTRAVENOUS | Status: DC
  Administered 2014-08-15 – 2014-08-17 (×4): via INTRAVENOUS

## 2014-08-15 MED ORDER — POTASSIUM CHLORIDE 10 MEQ/50ML IV SOLN
10.0000 meq | INTRAVENOUS | Status: AC
  Administered 2014-08-15 (×8): 10 meq via INTRAVENOUS
  Filled 2014-08-15 (×8): qty 50

## 2014-08-15 MED ORDER — VASOPRESSIN 20 UNIT/ML IV SOLN
0.0300 [IU]/min | INTRAVENOUS | Status: DC
  Filled 2014-08-15: qty 2

## 2014-08-15 MED ORDER — HYDROCORTISONE NA SUCCINATE PF 100 MG IJ SOLR
100.0000 mg | Freq: Three times a day (TID) | INTRAMUSCULAR | Status: DC
  Administered 2014-08-15 – 2014-08-17 (×6): 100 mg via INTRAVENOUS
  Filled 2014-08-15 (×6): qty 2

## 2014-08-15 NOTE — Progress Notes (Addendum)
INFECTIOUS DISEASE PROGRESS NOTE  ID: Logan Combs is a 64 y.o. male with  Principal Problem:   Upper GI bleed Active Problems:   Human immunodeficiency virus (HIV) disease   Alcohol consumption heavy   Liver mass   Leukocytosis   Anemia of chronic disease   ARF (acute renal failure)   HTN (hypertension), benign   Hepatitis C   Protein-calorie malnutrition, severe   Acute posthemorrhagic anemia   Endotracheal tube present   Hematemesis with nausea  Subjective: Continues on vent continues to have fever.  Abtx:  Anti-infectives    Start     Dose/Rate Route Frequency Ordered Stop   08/15/14 1300  emtricitabine-tenofovir (TRUVADA) 200-300 MG per tablet 1 tablet     1 tablet Per Tube Every 48 hours 08/13/14 2225     08/14/14 1700  levofloxacin (LEVAQUIN) IVPB 750 mg     750 mg 100 mL/hr over 90 Minutes Intravenous Every 48 hours 08/14/14 1630     08/14/14 1600  metroNIDAZOLE (FLAGYL) IVPB 250 mg     250 mg 50 mL/hr over 60 Minutes Intravenous Every 8 hours 08/14/14 1553     08/13/14 1330  ciprofloxacin (CIPRO) IVPB 400 mg  Status:  Discontinued     400 mg 200 mL/hr over 60 Minutes Intravenous  Once 08/13/14 1321 08/13/14 1500   08/13/14 1315  ciprofloxacin (CIPRO) IVPB 400 mg  Status:  Discontinued     400 mg 200 mL/hr over 60 Minutes Intravenous Every 12 hours 08/13/14 1313 08/13/14 1321   08/13/14 1300  dolutegravir (TIVICAY) tablet 50 mg     50 mg Oral Daily 08/13/14 1152     08/13/14 1300  emtricitabine-tenofovir (TRUVADA) 200-300 MG per tablet 1 tablet  Status:  Discontinued     1 tablet Oral Every 48 hours 08/13/14 1158 08/13/14 2225   08/13/14 1200  emtricitabine-tenofovir (TRUVADA) 200-300 MG per tablet 1 tablet  Status:  Discontinued     1 tablet Oral Daily 08/13/14 1152 08/13/14 1157      Medications:  Scheduled: . antiseptic oral rinse  7 mL Mouth Rinse QID  . chlorhexidine  15 mL Mouth Rinse BID  . dolutegravir  50 mg Oral Daily  .  emtricitabine-tenofovir  1 tablet Per Tube Q48H  . hydrocortisone sod succinate (SOLU-CORTEF) inj  100 mg Intravenous Q8H  . insulin aspart  0-15 Units Subcutaneous 6 times per day  . levofloxacin (LEVAQUIN) IV  750 mg Intravenous Q48H  . metronidazole  250 mg Intravenous Q8H  . [START ON 08/16/2014] pantoprazole (PROTONIX) IV  40 mg Intravenous Q12H  . potassium chloride  10 mEq Intravenous Q1H    Objective: Vital signs in last 24 hours: Temp:  [100.2 F (37.9 C)-101.8 F (38.8 C)] 101.1 F (38.4 C) (07/14 0930) Pulse Rate:  [64-71] 68 (07/14 0303) Resp:  [11-15] 13 (07/14 0930) BP: (95-111)/(53-58) 99/54 mmHg (07/14 0303) SpO2:  [97 %-100 %] 100 % (07/14 0930) Arterial Line BP: (46-137)/(34-71) 124/70 mmHg (07/14 0930) FiO2 (%):  [30 %] 30 % (07/14 0845) Weight:  [63 kg (138 lb 14.2 oz)] 63 kg (138 lb 14.2 oz) (07/14 0500)   General appearance: no distress and on vent.  Resp: clear to auscultation bilaterally Cardio: regular rate and rhythm GI: normal findings: soft, non-tender and abnormal findings:  hypoactive bowel sounds Extremities: edema none  Lab Results  Recent Labs  08/13/14 1820  08/13/14 2225 08/14/14 0415 08/15/14 0500  WBC 14.2*  --   --  16.1*  --   HGB 9.9*  --  10.6* 11.3*  --   HCT 29.1*  --  30.2* 31.3*  --   NA  --   < >  --  139 140  K  --   < >  --  3.8 2.7*  CL  --   < >  --  115* 107  CO2  --   < >  --  18* 23  BUN  --   < >  --  51* 47*  CREATININE  --   < >  --  1.85* 1.55*  < > = values in this interval not displayed. Liver Panel  Recent Labs  08/14/14 0415 08/15/14 0500  PROT 5.2* 5.2*  ALBUMIN 1.9* 1.9*  AST 1001* 980*  ALT 393* 468*  ALKPHOS 240* 299*  BILITOT 2.6* 2.7*   Sedimentation Rate No results for input(s): ESRSEDRATE in the last 72 hours. C-Reactive Protein No results for input(s): CRP in the last 72 hours.  Microbiology: Recent Results (from the past 240 hour(s))  Blood culture (routine x 2)     Status: None     Collection Time: 08/06/14 12:03 AM  Result Value Ref Range Status   Specimen Description RIGHT ANTECUBITAL  Final   Special Requests BOTTLES DRAWN AEROBIC AND ANAEROBIC 10CC  Final   Culture NO GROWTH 5 DAYS  Final   Report Status 08/11/2014 FINAL  Final  Blood culture (routine x 2)     Status: None   Collection Time: 08/06/14 12:10 AM  Result Value Ref Range Status   Specimen Description LEFT ANTECUBITAL  Final   Special Requests BOTTLES DRAWN AEROBIC ONLY 10CC  Final   Culture NO GROWTH 5 DAYS  Final   Report Status 08/11/2014 FINAL  Final  Gram stain     Status: None   Collection Time: 08/06/14  4:10 PM  Result Value Ref Range Status   Specimen Description FLUID PERITONEAL  Final   Special Requests NONE  Final   Gram Stain   Final    MODERATE WBC PRESENT,BOTH PMN AND MONONUCLEAR NO ORGANISMS SEEN    Report Status 08/06/2014 FINAL  Final  Culture, body fluid-bottle     Status: None   Collection Time: 08/06/14  4:10 PM  Result Value Ref Range Status   Specimen Description FLUID PERITONEAL  Final   Special Requests NONE  Final   Culture NO GROWTH 5 DAYS  Final   Report Status 08/11/2014 FINAL  Final  MRSA PCR Screening     Status: None   Collection Time: 08/12/14  9:36 PM  Result Value Ref Range Status   MRSA by PCR NEGATIVE NEGATIVE Final    Comment:        The GeneXpert MRSA Assay (FDA approved for NASAL specimens only), is one component of a comprehensive MRSA colonization surveillance program. It is not intended to diagnose MRSA infection nor to guide or monitor treatment for MRSA infections.     Studies/Results: Dg Chest Port 1 View  08/14/2014   CLINICAL DATA:  Hypoxia  EXAM: PORTABLE CHEST - 1 VIEW  COMPARISON:  August 13, 2014  FINDINGS: Endotracheal tube tip is 5.2 cm above the carina. Central catheter tip is in the superior vena cava. No pneumothorax. There is persistent patchy atelectatic change in the base regions, slightly less on the left and largely  unchanged on the right. Lungs elsewhere are clear. Heart size and pulmonary vascularity are normal. No adenopathy. No bone  lesions. Scattered foci of calcification are noted in each carotid artery.  IMPRESSION: Atelectasis in each lower lobe region. Tube and catheter positions as described without pneumothorax.   Electronically Signed   By: Lowella Grip III M.D.   On: 08/14/2014 07:03   Dg Chest Port 1 View  08/13/2014   CLINICAL DATA:  Central line placement.  EXAM: PORTABLE CHEST - 1 VIEW  COMPARISON:  08/13/2014  FINDINGS: The endotracheal tube is 5 cm above the carina. There is a right IJ central venous Cordis with its tip in the mid SVC. No complicating features. Persistent areas of subsegmental atelectasis at the lung bases but no edema or effusions.  IMPRESSION: Right IJ central venous Cordis in good position without complicating features.  Endotracheal tube is 5 cm above the carina.  Bibasilar atelectasis but infiltrates or effusions.   Electronically Signed   By: Marijo Sanes M.D.   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  Not clear where his fever is coming from- MI, hepatitis.Marland Kitchen His CXR does not show clear infiltrate, SpO2 stable. His TTE does not suggest pericarditis post MI.  WBC remains the same, h/h stable. Will check amylase, lipase Consider imaging his abd?        [name] Infectious Diseases (pager) [phone] [email or web address] 08/15/2014, 9:44 AM  LOS: 3 days

## 2014-08-15 NOTE — Progress Notes (Signed)
PULMONARY / CRITICAL CARE MEDICINE   Name: SAVIEN MAMULA MRN: [id number] DOB: [birth year]    ADMISSION DATE:  08/12/2014 CONSULTATION DATE:  08/12/2014  REFERRING MD :  [facility]  CHIEF COMPLAINT:  Hematemesis  INITIAL PRESENTATION:  64 yo male with hematemesis and melena.  He has hx of HIV, Hep C, ETOH with cirrhosis/ascites.  Had recent admit for portal vein thrombosis and liver mass consistent with hepatocellular carcinoma.  STUDIES:  7/11 CT c spine >> no acute injury; multilevel degenerative disc disease 7/12 EGD >> banding of varices 7/13 Echo >> EF 45 to 95%, grade 1 diastolic dysfx  SIGNIFICANT EVENTS: 7/5-7/8 > admission for CAP, cirrhosis, portal vein thrombosis 7/11 Re admit, Kcentra, GI consult >> EGD and VDRF; hemorrhagic shock with NSTEMI from demand ischemia 7/13 Goals of care d/w family >> continue medical therapy, DNR if arrests  SUBJECTIVE: Remains on pressors.  Agitated with WUA.  VITAL SIGNS: Temp:  [99.7 F (37.6 C)-101.8 F (38.8 C)] 101.3 F (38.5 C) (07/14 0600) Pulse Rate:  [63-71] 68 (07/14 0303) Resp:  [12-15] 14 (07/14 0600) BP: (95-111)/(53-58) 99/54 mmHg (07/14 0303) SpO2:  [97 %-100 %] 100 % (07/14 0600) Arterial Line BP: (93-137)/(52-66) 118/66 mmHg (07/14 0600) FiO2 (%):  [30 %] 30 % (07/14 0303) Weight:  [138 lb 14.2 oz (63 kg)] 138 lb 14.2 oz (63 kg) (07/14 0500) INTAKE / OUTPUT:  Intake/Output Summary (Last 24 hours) at 08/15/14 0736 Last data filed at 08/15/14 0600  Gross per 24 hour  Intake 4704.95 ml  Output   2275 ml  Net 2429.95 ml    PHYSICAL EXAMINATION: General: ill appearing Neuro: RASS -3 HEENT: ETT in place Cardiovascular: regular, no murmur Lungs: no wheeze Abdomen: soft, mild distention, non tender Musculoskeletal: decreased muscle bulk, 1+ edema Skin: no rashes  LABS:  CBC  Recent Labs Lab 08/13/14 0915 08/13/14 1820 08/13/14 2225 08/14/14 0415  WBC 14.9* 14.2*  --  16.1*  HGB 9.9* 9.9* 10.6* 11.3*   HCT 29.0* 29.1* 30.2* 31.3*  PLT 239 110*  --  134*   Coag's  Recent Labs Lab 08/08/14 1239  08/13/14 1820 08/14/14 0415 08/15/14 0500  APTT 43*  --   --   --   --   INR 1.36  < > 1.53* 1.46 1.66*  < > = values in this interval not displayed.   BMET  Recent Labs Lab 08/13/14 1945 08/14/14 0415 08/15/14 0500  NA 137 139 140  K 3.8 3.8 2.7*  CL 114* 115* 107  CO2 16* 18* 23  BUN 45* 51* 47*  CREATININE 1.91* 1.85* 1.55*  GLUCOSE 200* 144* 133*   Electrolytes  Recent Labs Lab 08/13/14 0343 08/13/14 1945 08/14/14 0415 08/15/14 0500  CALCIUM 7.8* 6.5* 6.9* 6.8*  MG 2.2  --  1.9  --   PHOS 5.8*  --  3.9  --    Sepsis Markers  Recent Labs Lab 08/12/14 1741 08/13/14 0343 08/13/14 2315  LATICACIDVEN 8.34* 1.9 2.0    Recent Labs Lab 08/13/14 1825 08/14/14 0055 08/14/14 0610  TROPONINI 7.43* >65.00* >65.00*    Liver Enzymes  Recent Labs Lab 08/13/14 1945 08/14/14 0415 08/15/14 0500  AST 884* 1001* 980*  ALT 317* 393* 468*  ALKPHOS 189* 240* 299*  BILITOT 2.2* 2.6* 2.7*  ALBUMIN 1.7* 1.9* 1.9*   Glucose  Recent Labs Lab 08/13/14 2339 08/14/14 0410 08/14/14 0749 08/14/14 1252 08/14/14 1619 08/14/14 2101  GLUCAP 131* 125* 127* 108* 93 143*  Imaging Dg Chest Port 1 View  08/14/2014   CLINICAL DATA:  Hypoxia  EXAM: PORTABLE CHEST - 1 VIEW  COMPARISON:  August 13, 2014  FINDINGS: Endotracheal tube tip is 5.2 cm above the carina. Central catheter tip is in the superior vena cava. No pneumothorax. There is persistent patchy atelectatic change in the base regions, slightly less on the left and largely unchanged on the right. Lungs elsewhere are clear. Heart size and pulmonary vascularity are normal. No adenopathy. No bone lesions. Scattered foci of calcification are noted in each carotid artery.  IMPRESSION: Atelectasis in each lower lobe region. Tube and catheter positions as described without pneumothorax.   Electronically Signed   By: Lowella Grip III M.D.   On: 08/14/2014 07:03   Dg Chest Port 1 View  08/13/2014   CLINICAL DATA:  Central line placement.  EXAM: PORTABLE CHEST - 1 VIEW  COMPARISON:  08/13/2014  FINDINGS: The endotracheal tube is 5 cm above the carina. There is a right IJ central venous Cordis with its tip in the mid SVC. No complicating features. Persistent areas of subsegmental atelectasis at the lung bases but no edema or effusions.  IMPRESSION: Right IJ central venous Cordis in good position without complicating features.  Endotracheal tube is 5 cm above the carina.  Bibasilar atelectasis but infiltrates or effusions.   Electronically Signed   By: Marijo Sanes M.D.   On: 08/13/2014 15:23     ASSESSMENT / PLAN:  PULMONARY ETT 7/12 >>> A: Compromised airway 2nd to Upper GI bleed. P:   Pressure support wean as tolerated >> hemodynamics, mental status barrier to extubation F/u CXR   CARDIOVASCULAR Rt IJ cordis 7/12 >>> A:  Hemorrhagic shock 2nd to Upper GI bleeding. Acute systolic heart failure from NSTEMI 2nd to demand ischemia. P: Wean pressors to keep MAP > 55, SBP > 90 in setting of cirrhosis No ASA, beta blocker due to bleeding and hypotension Defer cardiology assessment for now Hold outpt lasix, aldactone Check cortisol  RENAL A:   Lactic acidosis. Non anion gap metabolic acidosis 2nd to hyperchloremia and AKI >> resolved. AKI. Hypokalemia. P:   D/c HCO3 in IV fluid Replace electrolytes as needed F/u BMET  GASTROINTESTINAL A:   Upper GI bleeding 2nd to esophageal varices s/p clipping. 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 Liver mass consistent with hepatocellular carcinoma. Shock liver. Severe protein calorie malnutrition. P:   Transition to bid protonix 7/14 Octreotide per GI Add tube feeds when okay with GI F/u LFT's  HEMATOLOGIC A:   Acute blood loss anemia - due to varices. Coagulopathy - due to warfarin +/- cirrhosis. Portal vein thrombosis. P: F/u Hb,  coag's Transfuse for Hb < 7 or bleeding Defer anti-coagulation for now >> will need to d/w GI when this can be resumed  INFECTIOUS A:   Fever 7/13 (Tm 101). Hx of HIV, Hep C. P:   Antiretroviral medicines per ID Day 2 levaquin, flagyl per ID  ENDOCRINE A:   Sick euthyroid. Hyperglycemia. P:   SSI  NEUROLOGIC A:   Acute metabolic encephalopathy. P:   Sedation:  Precedex gtt / Fentanyl PRN. RASS Goal: 0 Daily WUA.  Goals of Care >> DNR.  CC time 45 minutes.  Chesley Mires, MD [facility] Pulmonary/Critical Care 08/15/2014, 8:07 AM Pager:  [phone] After 3pm call: [phone]

## 2014-08-15 NOTE — Progress Notes (Signed)
[facility] ADULT ICU REPLACEMENT PROTOCOL FOR AM LAB REPLACEMENT ONLY  The patient does apply for the [facility] Adult ICU Electrolyte Replacment Protocol based on the criteria listed below:   1. Is GFR >/= 40 ml/min? Yes.    Patient's GFR today is 46 2. Is urine output >/= 0.5 ml/kg/hr for the last 6 hours? Yes.   Patient's UOP is 1.32 ml/kg/hr 3. Is BUN < 60 mg/dL? Yes.    Patient's BUN today is 47 4. Abnormal electrolyte  K 2.7 5. Ordered repletion with: per protocol 6. If a panic level lab has been reported, has the CCM MD in charge been notified? Yes.  .   Physician:  Antoine Primas 08/15/2014 6:51 AM

## 2014-08-15 NOTE — Progress Notes (Signed)
Logan Combs 9:49 AM  Subjective: Patient's main problem seems to be altered mental status but no signs of bleeding  Objective: Critically ill but abdomen is soft mild ascites minimal bowel sounds lungs have bilateral breath sounds no chest wall crepitus CBC pending BUN and creatinine slight decrease decreased transaminases and increased bili compatible with shock liver INR slight increase  Assessment: Multiple medical problems  Plan: If no signs of bleeding then  tomorrow okay with me to place feeding tube for trial of lactulose etc. but will leave care to critical care team and consideration of head CT at some point  [facility] E  Pager [phone] After 5PM or if no answer call [phone]

## 2014-08-15 NOTE — Progress Notes (Signed)
CRITICAL VALUE ALERT  Critical value received:  K+  Date of notification:  08/15/14  Time of notification:  0647  Critical value read back: yes  Nurse who received alert:  K. Cristina Gong RN  MD notified (1st page):  [phone]  Time of first page:  [phone]  MD notified (2nd page):N/A  Time of second page:N/A  Responding MD:  Warren Lacy  Time MD responded:  N/A

## 2014-08-16 ENCOUNTER — Inpatient Hospital Stay (HOSPITAL_COMMUNITY): Payer: Self-pay

## 2014-08-16 DIAGNOSIS — R5081 Fever presenting with conditions classified elsewhere: Secondary | ICD-10-CM

## 2014-08-16 DIAGNOSIS — G9340 Encephalopathy, unspecified: Secondary | ICD-10-CM

## 2014-08-16 LAB — GLUCOSE, CAPILLARY
GLUCOSE-CAPILLARY: 122 mg/dL — AB (ref 65–99)
Glucose-Capillary: 135 mg/dL — ABNORMAL HIGH (ref 65–99)
Glucose-Capillary: 87 mg/dL (ref 65–99)
Glucose-Capillary: 141 mg/dL — ABNORMAL HIGH (ref 65–99)
Glucose-Capillary: 152 mg/dL — ABNORMAL HIGH (ref 65–99)
Glucose-Capillary: 173 mg/dL — ABNORMAL HIGH (ref 65–99)

## 2014-08-16 LAB — CBC
HCT: 29.6 % — ABNORMAL LOW (ref 39.0–52.0)
HEMOGLOBIN: 10.5 g/dL — AB (ref 13.0–17.0)
MCH: 30.2 pg (ref 26.0–34.0)
MCHC: 35.5 g/dL (ref 30.0–36.0)
MCV: 85.1 fL (ref 78.0–100.0)
Platelets: 107 10*3/uL — ABNORMAL LOW (ref 150–400)
RBC: 3.48 MIL/uL — ABNORMAL LOW (ref 4.22–5.81)
RDW: 18.3 % — ABNORMAL HIGH (ref 11.5–15.5)
WBC: 15.2 10*3/uL — ABNORMAL HIGH (ref 4.0–10.5)

## 2014-08-16 LAB — COMPREHENSIVE METABOLIC PANEL
ALBUMIN: 2 g/dL — AB (ref 3.5–5.0)
ALK PHOS: 260 U/L — AB (ref 38–126)
ALT: 322 U/L — ABNORMAL HIGH (ref 17–63)
AST: 579 U/L — ABNORMAL HIGH (ref 15–41)
Anion gap: 7 (ref 5–15)
BUN: 43 mg/dL — ABNORMAL HIGH (ref 6–20)
CO2: 23 mmol/L (ref 22–32)
Calcium: 7.5 mg/dL — ABNORMAL LOW (ref 8.9–10.3)
Chloride: 113 mmol/L — ABNORMAL HIGH (ref 101–111)
Creatinine, Ser: 1.38 mg/dL — ABNORMAL HIGH (ref 0.61–1.24)
GFR calc Af Amer: >60 mL/min (ref >60)
GFR, EST NON AFRICAN AMERICAN: 53 mL/min — AB (ref >60)
Glucose, Bld: 149 mg/dL — ABNORMAL HIGH (ref 65–99)
POTASSIUM: 3.3 mmol/L — AB (ref 3.5–5.1)
SODIUM: 143 mmol/L (ref 135–145)
Total Bilirubin: 3.4 mg/dL — ABNORMAL HIGH (ref 0.3–1.2)
Total Protein: 5.7 g/dL — ABNORMAL LOW (ref 6.5–8.1)

## 2014-08-16 LAB — BASIC METABOLIC PANEL
ANION GAP: 9 (ref 5–15)
BUN: 41 mg/dL — ABNORMAL HIGH (ref 6–20)
CHLORIDE: 110 mmol/L (ref 101–111)
CO2: 22 mmol/L (ref 22–32)
Calcium: 7.5 mg/dL — ABNORMAL LOW (ref 8.9–10.3)
Creatinine, Ser: 1.37 mg/dL — ABNORMAL HIGH (ref 0.61–1.24)
GFR, EST NON AFRICAN AMERICAN: 53 mL/min — AB (ref >60)
Glucose, Bld: 149 mg/dL — ABNORMAL HIGH (ref 65–99)
POTASSIUM: 2.8 mmol/L — AB (ref 3.5–5.1)
SODIUM: 141 mmol/L (ref 135–145)

## 2014-08-16 LAB — MAGNESIUM: Magnesium: 1.9 mg/dL (ref 1.7–2.4)

## 2014-08-16 MED ORDER — LACTULOSE 10 GM/15ML PO SOLN
30.0000 g | Freq: Two times a day (BID) | ORAL | Status: DC
  Administered 2014-08-16 – 2014-08-19 (×7): 30 g via ORAL
  Filled 2014-08-16 (×7): qty 45

## 2014-08-16 MED ORDER — POTASSIUM CHLORIDE 10 MEQ/50ML IV SOLN
10.0000 meq | INTRAVENOUS | Status: AC
  Administered 2014-08-16 (×8): 10 meq via INTRAVENOUS
  Filled 2014-08-16 (×8): qty 50

## 2014-08-16 MED ORDER — ALBUMIN HUMAN 5 % IV SOLN
12.5000 g | Freq: Once | INTRAVENOUS | Status: AC
  Administered 2014-08-16: 12.5 g via INTRAVENOUS
  Filled 2014-08-16: qty 250

## 2014-08-16 NOTE — Progress Notes (Signed)
[place] for Infectious Disease  Date of Admission:  08/12/2014  Antibiotics:  Levaquin/flagyl Tivicay/truvada  Subjective: On vent  Objective: Temp:  [97.2 F (36.2 C)-99.5 F (37.5 C)] 97.3 F (36.3 C) (07/15 1200) Pulse Rate:  [76] 76 (07/15 0825) Resp:  [12-20] 14 (07/15 1200) BP: (102)/(63) 102/63 mmHg (07/15 0825) SpO2:  [98 %-100 %] 100 % (07/15 1200) Arterial Line BP: (74-157)/(49-84) 83/53 mmHg (07/15 1200) FiO2 (%):  [30 %] 30 % (07/15 1141)  General: on vent, alert, does not respond to commands Skin: no rashes Lungs: diffuse rhonchi Cor: tachy RR Abdomen: soft, tense   Lab Results Lab Results  Component Value Date   WBC 15.2* 08/16/2014   HGB 10.5* 08/16/2014   HCT 29.6* 08/16/2014   MCV 85.1 08/16/2014   PLT 107* 08/16/2014    Lab Results  Component Value Date   CREATININE 1.38* 08/16/2014   BUN 43* 08/16/2014   NA 143 08/16/2014   K 3.3* 08/16/2014   CL 113* 08/16/2014   CO2 23 08/16/2014    Lab Results  Component Value Date   ALT 322* 08/16/2014   AST 579* 08/16/2014   ALKPHOS 260* 08/16/2014   BILITOT 3.4* 08/16/2014      Microbiology: Recent Results (from the past 240 hour(s))  Gram stain     Status: None   Collection Time: 08/06/14  4:10 PM  Result Value Ref Range Status   Specimen Description FLUID PERITONEAL  Final   Special Requests NONE  Final   Gram Stain   Final    MODERATE WBC PRESENT,BOTH PMN AND MONONUCLEAR NO ORGANISMS SEEN    Report Status 08/06/2014 FINAL  Final  Culture, body fluid-bottle     Status: None   Collection Time: 08/06/14  4:10 PM  Result Value Ref Range Status   Specimen Description FLUID PERITONEAL  Final   Special Requests NONE  Final   Culture NO GROWTH 5 DAYS  Final   Report Status 08/11/2014 FINAL  Final  MRSA PCR Screening     Status: None   Collection Time: 08/12/14  9:36 PM  Result Value Ref Range Status   MRSA by PCR NEGATIVE NEGATIVE Final    Comment:        The GeneXpert MRSA  Assay (FDA approved for NASAL specimens only), is one component of a comprehensive MRSA colonization surveillance program. It is not intended to diagnose MRSA infection nor to guide or monitor treatment for MRSA infections.     Studies/Results: Dg Chest Port 1 View  08/16/2014   CLINICAL DATA:  Respiratory failure.  EXAM: PORTABLE CHEST - 1 VIEW  COMPARISON:  08/14/2014.  FINDINGS: Endotracheal tube and right IJ sheath in stable position. Stable cardiomegaly . Slight progression of bibasilar subsegmental atelectasis and bibasilar infiltrates. Small left pleural effusion. No pneumothorax. Metallic densities are noted over the lower mid chest. Carotid vascular calcification .  IMPRESSION: 1. Endotracheal tube and right IJ sheath in stable position. 2. Slight progression of bibasilar atelectasis and infiltrates. 3. Stable cardiomegaly. 4. Carotid vascular disease.   Electronically Signed   By: Marcello Moores  Register   On: 08/16/2014 07:11    Assessment/Plan:  1) HIV - on ARVs.   2) fever - poor prognosis.  I agree with hospice.  Has HCC, is Child Pugh C, requiring pressor support. On antibiotics.   Dr. Megan Salon will be available over the weekend if needed and will monitor in Epic.     Scharlene Gloss, [place] for  Infectious Disease [facility] [email or web address] [phone] pager   [phone] cell 08/16/2014, 1:58 PM

## 2014-08-16 NOTE — Progress Notes (Signed)
Spoke with pt's family.    They are still hopeful that he can come of vent and pressors on his own.  They are hopeful that his mental status will improve to the point he can communicate with them.  They understand his prognosis is grim, and wanted to know more about option for hospice care.  They have contact [facility] to find out more.  For now will continue medical therapies.  Will give another unit of albumin.  Will have OG tube placed and then start lactulose.  Will consult palliative care to assist with goals of care discussion.  I have explained to family that if he does not improve further, then might need to revisit whether to continue vent support or transition to comfort care.  Chesley Mires, MD [facility] Pulmonary/Critical Care 08/16/2014, 12:47 PM Pager:  [phone] After 3pm call: [phone]

## 2014-08-16 NOTE — Progress Notes (Signed)
PULMONARY / CRITICAL CARE MEDICINE   Name: Logan Combs MRN: [id number] DOB: 1951-01-23    ADMISSION DATE:  08/12/2014 CONSULTATION DATE:  08/12/2014  REFERRING MD :  Triad  CHIEF COMPLAINT:  Hematemesis  INITIAL PRESENTATION:  64 yo male with hematemesis and melena.  He has hx of HIV, Hep C, ETOH with cirrhosis/ascites.  Had recent admit for portal vein thrombosis and liver mass consistent with hepatocellular carcinoma.  STUDIES:  7/11 CT c spine >> no acute injury; multilevel degenerative disc disease 7/12 EGD >> banding of varices 7/13 Echo >> EF 45 to 14%, grade 1 diastolic dysfx  SIGNIFICANT EVENTS: 7/5-7/8 > admission for CAP, cirrhosis, portal vein thrombosis 7/11 Re admit, Kcentra, GI consult >> EGD and VDRF; hemorrhagic shock with NSTEMI from demand ischemia 7/13 Goals of care d/w family >> continue medical therapy, DNR if arrests  SUBJECTIVE: Remains on pressors.  Periods of apnea on SBT.    VITAL SIGNS: Temp:  [97.9 F (36.6 C)-100.8 F (38.2 C)] 97.9 F (36.6 C) (07/15 0900) Pulse Rate:  [76] 76 (07/15 0825) Resp:  [12-19] 14 (07/15 0900) BP: (102)/(63) 102/63 mmHg (07/15 0825) SpO2:  [98 %-100 %] 100 % (07/15 0900) Arterial Line BP: (77-157)/(51-84) 77/51 mmHg (07/15 0900) FiO2 (%):  [30 %] 30 % (07/15 0837) INTAKE / OUTPUT:  Intake/Output Summary (Last 24 hours) at 08/16/14 1003 Last data filed at 08/16/14 0741  Gross per 24 hour  Intake 2896.2 ml  Output   2715 ml  Net  181.2 ml    PHYSICAL EXAMINATION: General: ill appearing Neuro: wakes up with stimulation, intermittently follows commands HEENT: ETT in place Cardiovascular: regular, no murmur Lungs: no wheeze Abdomen: soft, mild distention, non tender Musculoskeletal: decreased muscle bulk, 1+ edema Skin: no rashes  LABS:  CBC  Recent Labs Lab 08/14/14 0415 08/15/14 0930 08/16/14 0336  WBC 16.1* 15.8* 15.2*  HGB 11.3* 10.8* 10.5*  HCT 31.3* 29.7* 29.6*  PLT 134* 117* 107*    Coag's  Recent Labs Lab 08/13/14 1820 08/14/14 0415 08/15/14 0500  INR 1.53* 1.46 1.66*     BMET  Recent Labs Lab 08/15/14 0500 08/16/14 0029 08/16/14 0336  NA 140 141 143  K 2.7* 2.8* 3.3*  CL 107 110 113*  CO2 23 22 23   BUN 47* 41* 43*  CREATININE 1.55* 1.37* 1.38*  GLUCOSE 133* 149* 149*   Electrolytes  Recent Labs Lab 08/13/14 0343  08/14/14 0415 08/15/14 0500 08/16/14 0029 08/16/14 0336  CALCIUM 7.8*  < > 6.9* 6.8* 7.5* 7.5*  MG 2.2  --  1.9  --   --  1.9  PHOS 5.8*  --  3.9  --   --   --   < > = values in this interval not displayed.   Sepsis Markers  Recent Labs Lab 08/12/14 1741 08/13/14 0343 08/13/14 2315  LATICACIDVEN 8.34* 1.9 2.0    Recent Labs Lab 08/13/14 1825 08/14/14 0055 08/14/14 0610  TROPONINI 7.43* >65.00* >65.00*    Liver Enzymes  Recent Labs Lab 08/14/14 0415 08/15/14 0500 08/16/14 0336  AST 1001* 980* 579*  ALT 393* 468* 322*  ALKPHOS 240* 299* 260*  BILITOT 2.6* 2.7* 3.4*  ALBUMIN 1.9* 1.9* 2.0*   Glucose  Recent Labs Lab 08/15/14 0811 08/15/14 1140 08/15/14 1648 08/15/14 1934 08/15/14 2326 08/16/14 0340  GLUCAP 130* 135* 144* 124* 150* 87    Imaging Dg Chest Port 1 View  08/16/2014   CLINICAL DATA:  Respiratory failure.  EXAM: PORTABLE CHEST -  1 VIEW  COMPARISON:  08/14/2014.  FINDINGS: Endotracheal tube and right IJ sheath in stable position. Stable cardiomegaly . Slight progression of bibasilar subsegmental atelectasis and bibasilar infiltrates. Small left pleural effusion. No pneumothorax. Metallic densities are noted over the lower mid chest. Carotid vascular calcification .  IMPRESSION: 1. Endotracheal tube and right IJ sheath in stable position. 2. Slight progression of bibasilar atelectasis and infiltrates. 3. Stable cardiomegaly. 4. Carotid vascular disease.   Electronically Signed   By: Marcello Moores  Register   On: 08/16/2014 07:11     ASSESSMENT / PLAN:  PULMONARY ETT 7/12 >>> A: Compromised  airway 2nd to Upper GI bleed. P:   Pressure support wean as tolerated >> hemodynamics, mental status barrier to extubation F/u CXR   CARDIOVASCULAR Rt IJ cordis 7/12 >>> A:  Hemorrhagic shock 2nd to Upper GI bleeding. Acute systolic heart failure from NSTEMI 2nd to demand ischemia. P: Wean pressors to keep MAP > 55, SBP > 90 in setting of cirrhosis No ASA, beta blocker due to bleeding and hypotension Defer cardiology assessment for now Hold outpt lasix, aldactone  RENAL A:   Lactic acidosis. Non anion gap metabolic acidosis 2nd to hyperchloremia and AKI >> resolved. AKI. Hypokalemia. P:   Replace electrolytes as needed F/u BMET  GASTROINTESTINAL A:   Upper GI bleeding 2nd to esophageal varices s/p clipping. 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 Liver mass consistent with hepatocellular carcinoma. Shock liver. Severe protein calorie malnutrition. P:   Transitioned to bid protonix 7/14 Octreotide per GI Add tube feeds when okay with GI F/u LFT's  HEMATOLOGIC A:   Acute blood loss anemia - due to varices. Coagulopathy - due to warfarin +/- cirrhosis. Portal vein thrombosis. P: F/u Hb, coag's Transfuse for Hb < 7 or bleeding Defer anti-coagulation for now >> will need to d/w GI when this can be resumed  INFECTIOUS A:   Fever 7/13 (Tm 101). Hx of HIV, Hep C. P:   Antiretroviral medicines per ID Day 3 levaquin, flagyl per ID  ENDOCRINE A:   Sick euthyroid. Hyperglycemia. P:   SSI  NEUROLOGIC A:   Acute metabolic encephalopathy. P:   RASS Goal: 0 Daily WUA Will need resume lactulose if family wishes to continue aggressive care  Goals of Care >> DNR.  Summary: He remains on pressors.  Not able to tolerate SBT.  Mental status is fluctuating.  Will d/w family about goals of care further.  CC time 35 minutes.  Chesley Mires, MD [facility] Pulmonary/Critical Care 08/16/2014, 10:03 AM Pager:  [phone] After 3pm call: [phone]

## 2014-08-16 NOTE — Progress Notes (Signed)
eLink Physician-Brief Progress Note Patient Name: Logan Combs DOB: [birth year] MRN: [id number]   Date of Service  08/16/2014  HPI/Events of Note  Patient with N/V earrlier.  OGT placed to low intermittent suction  eICU Interventions  OGT  Order modified to reflect change     Intervention Category Intermediate Interventions: Other:  Tehillah Cipriani 08/16/2014, 11:20 PM

## 2014-08-16 NOTE — Progress Notes (Signed)
eLink Physician-Brief Progress Note Patient Name: Logan Combs DOB: 15-Aug-1950 MRN: [id number]   Date of Service  08/16/2014  HPI/Events of Note  Hypokalemia  eICU Interventions  Potassium replaced     Intervention Category Major Interventions: Electrolyte abnormality - evaluation and management  DETERDING,ELIZABETH 08/16/2014, 1:11 AM

## 2014-08-16 NOTE — Progress Notes (Signed)
Logan Combs 10:21 AM  Subjective: Patient without any obvious change and not really waking up although does respond to stimulation but no signs of bleeding Objective: Still on pressors afebrile not weaning from the ventilator well abdomen mild ascites soft some grimacing with palpation hemoglobin and white count stable as well as BUN and creatinine transaminases slight decrease and bili with slight rise as well as INR  Assessment: Multiple medical problems  Plan: Okay to place feeding tube for meds possible nutrition and lactulose and I would use octreotide 1 more day and then could wean that tomorrow and please call me this weekend if I could be of any assistance otherwise I will check on tomorrow  [facility] E  Pager [phone] After 5PM or if no answer call [phone]

## 2014-08-16 NOTE — Progress Notes (Signed)
While giving the patient a bath tonight pt vomited, around his ET tube, a large dark red blood clot about the size of a golf ball as well as a dark red saliva. Pt was turned on his side, mouth was suctioned, prn zofran was given and OG tube was connected to low intermittent suction. Pt abdomen is distended and bowel sounds are heard in all four quadrants. VS remained stable. On call physician was notified and orders are to be added for low intermittent suction.

## 2014-08-17 ENCOUNTER — Inpatient Hospital Stay (HOSPITAL_COMMUNITY): Payer: Self-pay

## 2014-08-17 DIAGNOSIS — R1084 Generalized abdominal pain: Secondary | ICD-10-CM

## 2014-08-17 DIAGNOSIS — K920 Hematemesis: Secondary | ICD-10-CM

## 2014-08-17 DIAGNOSIS — R110 Nausea: Secondary | ICD-10-CM

## 2014-08-17 DIAGNOSIS — Z515 Encounter for palliative care: Secondary | ICD-10-CM

## 2014-08-17 DIAGNOSIS — B1710 Acute hepatitis C without hepatic coma: Secondary | ICD-10-CM

## 2014-08-17 LAB — CBC
HEMATOCRIT: 29.8 % — AB (ref 39.0–52.0)
HEMOGLOBIN: 10.1 g/dL — AB (ref 13.0–17.0)
MCH: 29.1 pg (ref 26.0–34.0)
MCHC: 33.9 g/dL (ref 30.0–36.0)
MCV: 85.9 fL (ref 78.0–100.0)
Platelets: 107 10*3/uL — ABNORMAL LOW (ref 150–400)
RBC: 3.47 MIL/uL — ABNORMAL LOW (ref 4.22–5.81)
RDW: 19.2 % — ABNORMAL HIGH (ref 11.5–15.5)
WBC: 13.3 10*3/uL — ABNORMAL HIGH (ref 4.0–10.5)

## 2014-08-17 LAB — COMPREHENSIVE METABOLIC PANEL
ALT: 206 U/L — ABNORMAL HIGH (ref 17–63)
ANION GAP: 9 (ref 5–15)
AST: 272 U/L — ABNORMAL HIGH (ref 15–41)
Albumin: 2.2 g/dL — ABNORMAL LOW (ref 3.5–5.0)
Alkaline Phosphatase: 201 U/L — ABNORMAL HIGH (ref 38–126)
BILIRUBIN TOTAL: 2.3 mg/dL — AB (ref 0.3–1.2)
BUN: 45 mg/dL — AB (ref 6–20)
CO2: 21 mmol/L — AB (ref 22–32)
Calcium: 8 mg/dL — ABNORMAL LOW (ref 8.9–10.3)
Chloride: 118 mmol/L — ABNORMAL HIGH (ref 101–111)
Creatinine, Ser: 1.29 mg/dL — ABNORMAL HIGH (ref 0.61–1.24)
GFR, EST NON AFRICAN AMERICAN: 57 mL/min — AB (ref >60)
Glucose, Bld: 126 mg/dL — ABNORMAL HIGH (ref 65–99)
POTASSIUM: 3 mmol/L — AB (ref 3.5–5.1)
SODIUM: 148 mmol/L — AB (ref 135–145)
Total Protein: 6 g/dL — ABNORMAL LOW (ref 6.5–8.1)

## 2014-08-17 LAB — GLUCOSE, CAPILLARY
Glucose-Capillary: 111 mg/dL — ABNORMAL HIGH (ref 65–99)
Glucose-Capillary: 122 mg/dL — ABNORMAL HIGH (ref 65–99)
Glucose-Capillary: 124 mg/dL — ABNORMAL HIGH (ref 65–99)
Glucose-Capillary: 133 mg/dL — ABNORMAL HIGH (ref 65–99)

## 2014-08-17 LAB — HIV-1 RNA QUANT-NO REFLEX-BLD
HIV 1 RNA Quant: 710 copies/mL
LOG10 HIV-1 RNA: 2.851 {Log_copies}/mL

## 2014-08-17 LAB — MAGNESIUM: Magnesium: 2 mg/dL (ref 1.7–2.4)

## 2014-08-17 LAB — PROTIME-INR
INR: 1.44 (ref 0.00–1.49)
Prothrombin Time: 17.6 seconds — ABNORMAL HIGH (ref 11.6–15.2)

## 2014-08-17 MED ORDER — POTASSIUM CHLORIDE 10 MEQ/50ML IV SOLN
10.0000 meq | INTRAVENOUS | Status: AC
  Administered 2014-08-17 (×4): 10 meq via INTRAVENOUS
  Filled 2014-08-17 (×4): qty 50

## 2014-08-17 MED ORDER — FENTANYL CITRATE (PF) 100 MCG/2ML IJ SOLN
12.5000 ug | INTRAMUSCULAR | Status: DC | PRN
  Administered 2014-08-17 (×3): 25 ug via INTRAVENOUS
  Filled 2014-08-17 (×3): qty 2

## 2014-08-17 MED ORDER — FOLIC ACID 5 MG/ML IJ SOLN
1.0000 mg | Freq: Every day | INTRAMUSCULAR | Status: DC
  Administered 2014-08-17 – 2014-08-23 (×7): 1 mg via INTRAVENOUS
  Filled 2014-08-17 (×10): qty 0.2

## 2014-08-17 MED ORDER — THIAMINE HCL 100 MG/ML IJ SOLN
100.0000 mg | INTRAMUSCULAR | Status: DC
  Administered 2014-08-17 – 2014-08-22 (×6): 100 mg via INTRAVENOUS
  Filled 2014-08-17: qty 1
  Filled 2014-08-17 (×2): qty 2
  Filled 2014-08-17 (×2): qty 1
  Filled 2014-08-17 (×2): qty 2

## 2014-08-17 MED ORDER — LEVOFLOXACIN IN D5W 750 MG/150ML IV SOLN
750.0000 mg | INTRAVENOUS | Status: DC
  Administered 2014-08-17 – 2014-08-19 (×3): 750 mg via INTRAVENOUS
  Filled 2014-08-17 (×3): qty 150

## 2014-08-17 MED ORDER — MIDAZOLAM HCL 2 MG/2ML IJ SOLN
1.0000 mg | INTRAMUSCULAR | Status: DC | PRN
  Administered 2014-08-17 (×2): 1 mg via INTRAVENOUS
  Administered 2014-08-18: 2 mg via INTRAVENOUS
  Filled 2014-08-17 (×4): qty 2

## 2014-08-17 MED ORDER — PROMETHAZINE HCL 25 MG/ML IJ SOLN: 25.0000 mg | INTRAMUSCULAR | Status: DC | PRN

## 2014-08-17 NOTE — Progress Notes (Signed)
Diron Pollyann Kennedy 11:05 AM  Subjective: Patient extubated himself but is still not alert and is having some black stools and did have a little coffee ground emesis and may be complains of some mild abdominal pain  Objective: Vital signs stable afebrile patient does look at me when I talk abdomen mild ascites minimal discomfort adequate bowels sounds hemoglobin fairly stable BUN essentially the same liver tests trending down  Assessment: Multiple medical problems  Plan: We will allow clear liquids if he passes his swallowing test and consider repeat endoscopy at some point if signs of ongoing blood loss but would need to do with anesthesia assistance and call me sooner when necessary otherwise I will check on tomorrow  [facility] E  Pager [phone] After 5PM or if no answer call [phone]

## 2014-08-17 NOTE — Progress Notes (Signed)
Pt extubated himself. Vital signs are stable o2 saturation 100%.

## 2014-08-17 NOTE — Progress Notes (Signed)
FAMILY MEETING:  Met with the patient's deceased partner's son and daughter in law in the room. The patient lost his partner of 44 years in February 2016. Patient is awake alert, is able to converse enough to make his needs known. He is asking for a soda.   Met with patient's brother Wille Glaser and sister Silva Bandy this afternoon, outside the patient's room. The patient has continued to complain of abdominal pain all day. The patient has been seen by speech therapy, he has been cleared for soft foods, thin liquids. As per GI recommendations, he is to only have sips of clear liquids in consideration of his recent upper GIB.   Discussed with patient's brother and sister about the patient's current status. They state that it gives them hope that the patient is no longer on the vent and is able to support the work of his own breathing. Discussed patient's underlying conditions such as recent upper GIB, varices s/p banding, liver mass consistent with hepatocellular carcinoma, portal vein thrombosis but patient not able to be anticoagulated due to his bleed, recent ongoing fevers, history of HIV Hep C.  PLAN: DNR DNI re confirmed Continue gentle treatments, attempt to wean pressors, transfer out of ICU Family understands overall poor prognosis, high likelihood of dying  Time limited trials of current therapies for next 24-48 hours, monitor trajectory Hospice consult for transfer to [facility] of sudden acute decompensation or if the patient is with no significant improvement in the next 24-48 hours.  Palliative will continue to follow and help guide decision making.  I will contact sister Silva Bandy on 08-18-14.   35 minutes spent.  Loistine Chance MD [place]

## 2014-08-17 NOTE — Progress Notes (Signed)
PULMONARY / CRITICAL CARE MEDICINE   Name: Logan Combs MRN: [id number] DOB: [birth year]    ADMISSION DATE:  08/12/2014 CONSULTATION DATE:  08/12/2014  REFERRING MD :  Triad  CHIEF COMPLAINT:  Hematemesis  INITIAL PRESENTATION:  64 yo male with hematemesis and melena.  He has hx of HIV, Hep C, ETOH with cirrhosis/ascites.  Had recent admit for portal vein thrombosis and liver mass consistent with hepatocellular carcinoma.  STUDIES:  7/11 CT c spine >> no acute injury; multilevel degenerative disc disease 7/12 EGD >> banding of varices 7/13 Echo >> EF 45 to 24%, grade 1 diastolic dysfx  SIGNIFICANT EVENTS: 7/5-7/8 > admission for CAP, cirrhosis, portal vein thrombosis 7/11 Re admit, Kcentra, GI consult >> EGD and VDRF; hemorrhagic shock with NSTEMI from demand ischemia 7/13 Goals of care d/w family >> continue medical therapy, DNR if arrests 7/16 self extubated  SUBJECTIVE: Remains on  Low dose levo gtt.   Self extubate - hoarse but able to vocalise his name  VITAL SIGNS: Temp:  [95.4 F (35.2 C)-98.2 F (36.8 C)] 97.7 F (36.5 C) (07/16 0500) Resp:  [12-20] 13 (07/16 0500) SpO2:  [97 %-100 %] 100 % (07/16 0500) Arterial Line BP: (73-132)/(49-121) 130/75 mmHg (07/16 0500) FiO2 (%):  [30 %] 30 % (07/16 0400) INTAKE / OUTPUT:  Intake/Output Summary (Last 24 hours) at 08/17/14 0909 Last data filed at 08/17/14 0600  Gross per 24 hour  Intake 2152.24 ml  Output   1580 ml  Net 572.24 ml    PHYSICAL EXAMINATION: General: chr ill appearing, no resp distress Neuro: alert,follows commands HEENT: no stridor, RIJ cordis Cardiovascular: regular, no murmur Lungs: no wheeze Abdomen: soft, mild distention, non tender Musculoskeletal: decreased muscle bulk, 1+ edema Skin: no rashes  LABS:  CBC  Recent Labs Lab 08/15/14 0930 08/16/14 0336 08/17/14 0356  WBC 15.8* 15.2* 13.3*  HGB 10.8* 10.5* 10.1*  HCT 29.7* 29.6* 29.8*  PLT 117* 107* 107*   Coag's  Recent  Labs Lab 08/14/14 0415 08/15/14 0500 08/17/14 0356  INR 1.46 1.66* 1.44     BMET  Recent Labs Lab 08/16/14 0029 08/16/14 0336 08/17/14 0356  NA 141 143 148*  K 2.8* 3.3* 3.0*  CL 110 113* 118*  CO2 22 23 21*  BUN 41* 43* 45*  CREATININE 1.37* 1.38* 1.29*  GLUCOSE 149* 149* 126*   Electrolytes  Recent Labs Lab 08/13/14 0343  08/14/14 0415  08/16/14 0029 08/16/14 0336 08/17/14 0356  CALCIUM 7.8*  < > 6.9*  < > 7.5* 7.5* 8.0*  MG 2.2  --  1.9  --   --  1.9 2.0  PHOS 5.8*  --  3.9  --   --   --   --   < > = values in this interval not displayed.   Sepsis Markers  Recent Labs Lab 08/12/14 1741 08/13/14 0343 08/13/14 2315  LATICACIDVEN 8.34* 1.9 2.0    Recent Labs Lab 08/13/14 1825 08/14/14 0055 08/14/14 0610  TROPONINI 7.43* >65.00* >65.00*    Liver Enzymes  Recent Labs Lab 08/15/14 0500 08/16/14 0336 08/17/14 0356  AST 980* 579* 272*  ALT 468* 322* 206*  ALKPHOS 299* 260* 201*  BILITOT 2.7* 3.4* 2.3*  ALBUMIN 1.9* 2.0* 2.2*   Glucose  Recent Labs Lab 08/16/14 0730 08/16/14 1226 08/16/14 1635 08/16/14 1942 08/17/14 0043 08/17/14 0448  GLUCAP 122* 141* 152* 173* 122* 124*    Imaging Dg Abd 1 View  08/16/2014   CLINICAL DATA:  Gastric tube  granulation  EXAM: ABDOMEN - 1 VIEW  COMPARISON:  None.  FINDINGS: The bowel gas pattern is normal. There is NG-tube with tip in distal stomach. Small calcifications in right upper quadrant may represent gallstones.  IMPRESSION: Normal small bowel gas pattern.  NG tube with tip in distal stomach.   Electronically Signed   By: Lahoma Crocker M.D.   On: 08/16/2014 16:45   Dg Chest Port 1 View  08/17/2014   CLINICAL DATA:  Respiratory failure.  History of smoking.  EXAM: PORTABLE CHEST - 1 VIEW  COMPARISON:  08/16/2014  FINDINGS: Right IJ central line sheath remains in place, tip overlying the superior vena cava. Endotracheal tube is/ in place, tip approximately 5.5 cm above carina. Orogastric tube tip is  beyond the image but also beyond the gastroesophageal junction.  Heart size is normal. Persistent lingular and right middle lobe atelectasis. There has been improvement in aeration of the left lung base. No pulmonary edema.  IMPRESSION: 1. Interval placement of nasogastric tube. 2. Improved aeration.   Electronically Signed   By: Nolon Nations M.D.   On: 08/17/2014 07:20   Dg Chest Port 1 View  08/16/2014   CLINICAL DATA:  Respiratory failure.  EXAM: PORTABLE CHEST - 1 VIEW  COMPARISON:  08/14/2014.  FINDINGS: Endotracheal tube and right IJ sheath in stable position. Stable cardiomegaly . Slight progression of bibasilar subsegmental atelectasis and bibasilar infiltrates. Small left pleural effusion. No pneumothorax. Metallic densities are noted over the lower mid chest. Carotid vascular calcification .  IMPRESSION: 1. Endotracheal tube and right IJ sheath in stable position. 2. Slight progression of bibasilar atelectasis and infiltrates. 3. Stable cardiomegaly. 4. Carotid vascular disease.   Electronically Signed   By: Marcello Moores  Register   On: 08/16/2014 07:11     ASSESSMENT / PLAN:  PULMONARY ETT 7/12 >>>7/16 A: Acute resp failure - airway protection 2nd to Upper GI bleed. P:  No reintubation   CARDIOVASCULAR Rt IJ cordis 7/12 >>> A:  Hemorrhagic shock 2nd to Upper GI bleeding. Acute systolic heart failure from NSTEMI 2nd to demand ischemia. P: Wean pressors to keep MAP > 55, SBP > 90 in setting of cirrhosis No ASA, beta blocker due to bleeding and hypotension Defer cardiology assessment for now Hold outpt lasix, aldactone  RENAL A:   Lactic acidosis. Non anion gap metabolic acidosis 2nd to hyperchloremia and AKI >> resolved. AKI. Hypokalemia. P:   Replace electrolytes as needed F/u BMET  GASTROINTESTINAL A:   Upper GI bleeding 2nd to esophageal varices s/p clipping. 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 Portal vein thrombosis Liver mass consistent with  hepatocellular carcinoma -note mRI 08/07/14 Shock liver. Severe protein calorie malnutrition. P:   Transitioned to bid protonix 7/14 Octreotide per GI Sips ok - will advance to comfort feeds F/u LFT's  HEMATOLOGIC A:   Acute blood loss anemia - due to varices. Coagulopathy - due to warfarin +/- cirrhosis. Portal vein thrombosis. P: F/u Hb, coag's Transfuse for Hb < 7 or bleeding Defer anti-coagulation for now >> will need to d/w GI when this can be resumed  INFECTIOUS A:   Fever 7/13 (Tm 101). Hx of HIV, Hep C. P:   Antiretroviral medicines per ID 7/12 >> levaquin, flagyl per ID  ENDOCRINE A:   Sick euthyroid. Hyperglycemia. P:   SSI  NEUROLOGIC A:   Acute metabolic encephalopathy. P:   RASS Goal: 0 Will need resume lactulose if family wishes to continue aggressive care  Family update -  7/15 They are still hopeful that he can come of vent and pressors on his own. They are hopeful that his mental status will improve to the point he can communicate with them. They understand his prognosis is grim, and wanted to know more about option for hospice care. They have contact [facility] to find out more.  Goals of Care >> DNR.  Summary: Self extubated & Ok resp status, should come off pressors & transfer out of ICU but overall prognosis grim  The patient is critically ill with multiple organ systems failure and requires high complexity decision making for assessment and support, frequent evaluation and titration of therapies, application of advanced monitoring technologies and extensive interpretation of multiple databases. Critical Care Time devoted to patient care services described in this note independent of APP time is 32 minutes.   Kara Mead MD. [place]. [place] [facility]monary & Critical care Pager [phone] If no response call [phone]    08/17/2014, 9:09 AM

## 2014-08-17 NOTE — Progress Notes (Signed)
ANTIBIOTIC CONSULT NOTE - follow up  Pharmacy Consult for Levaquin Indication: Intra-abdominal Infection  Allergies  Allergen Reactions  . Penicillins Anaphylaxis  . Codeine Hives and Itching    Patient Measurements: Height: 5\' 9"  (175.3 cm) Weight: 138 lb 14.2 oz (63 kg) IBW/kg (Calculated) : 70.7   Vital Signs: Temp: 96.1 F (35.6 C) (07/16 1000) Temp Source: Core (Comment) (07/16 0748) BP: 136/89 mmHg (07/16 0738) Intake/Output from previous day: 07/15 0701 - 07/16 0700 In: 2317.2 [I.V.:1622.2; IV Piggyback:650] Out: 0109 [Urine:1410; Emesis/NG output:170] Intake/Output from this shift: Total I/O In: 238.1 [I.V.:138.1; IV Piggyback:100] Out: 70 [Urine:70]  Labs:  Recent Labs  08/15/14 0930 08/16/14 0029 08/16/14 0336 08/17/14 0356  WBC 15.8*  --  15.2* 13.3*  HGB 10.8*  --  10.5* 10.1*  PLT 117*  --  107* 107*  CREATININE  --  1.37* 1.38* 1.29*   Estimated Creatinine Clearance: 52.2 mL/min (by C-G formula based on Cr of 1.29). No results for input(s): VANCOTROUGH, VANCOPEAK, VANCORANDOM, GENTTROUGH, GENTPEAK, GENTRANDOM, TOBRATROUGH, TOBRAPEAK, TOBRARND, AMIKACINPEAK, AMIKACINTROU, AMIKACIN in the last 72 hours.   Microbiology: Recent Results (from the past 720 hour(s))  Blood culture (routine x 2)     Status: None   Collection Time: 08/06/14 12:03 AM  Result Value Ref Range Status   Specimen Description RIGHT ANTECUBITAL  Final   Special Requests BOTTLES DRAWN AEROBIC AND ANAEROBIC 10CC  Final   Culture NO GROWTH 5 DAYS  Final   Report Status 08/11/2014 FINAL  Final  Blood culture (routine x 2)     Status: None   Collection Time: 08/06/14 12:10 AM  Result Value Ref Range Status   Specimen Description LEFT ANTECUBITAL  Final   Special Requests BOTTLES DRAWN AEROBIC ONLY 10CC  Final   Culture NO GROWTH 5 DAYS  Final   Report Status 08/11/2014 FINAL  Final  Gram stain     Status: None   Collection Time: 08/06/14  4:10 PM  Result Value Ref Range Status    Specimen Description FLUID PERITONEAL  Final   Special Requests NONE  Final   Gram Stain   Final    MODERATE WBC PRESENT,BOTH PMN AND MONONUCLEAR NO ORGANISMS SEEN    Report Status 08/06/2014 FINAL  Final  Culture, body fluid-bottle     Status: None   Collection Time: 08/06/14  4:10 PM  Result Value Ref Range Status   Specimen Description FLUID PERITONEAL  Final   Special Requests NONE  Final   Culture NO GROWTH 5 DAYS  Final   Report Status 08/11/2014 FINAL  Final  MRSA PCR Screening     Status: None   Collection Time: 08/12/14  9:36 PM  Result Value Ref Range Status   MRSA by PCR NEGATIVE NEGATIVE Final    Comment:        The GeneXpert MRSA Assay (FDA approved for NASAL specimens only), is one component of a comprehensive MRSA colonization surveillance program. It is not intended to diagnose MRSA infection nor to guide or monitor treatment for MRSA infections.     Medical History: Past Medical History  Diagnosis Date  . HIV (human immunodeficiency virus infection)   . Hepatitis C     Medications:  Scheduled:  . antiseptic oral rinse  7 mL Mouth Rinse QID  . chlorhexidine  15 mL Mouth Rinse BID  . dolutegravir  50 mg Oral Daily  . emtricitabine-tenofovir  1 tablet Per Tube Q48H  . insulin aspart  0-15 Units Subcutaneous 6  times per day  . lactulose  30 g Oral BID  . levofloxacin (LEVAQUIN) IV  750 mg Intravenous Q48H  . metronidazole  250 mg Intravenous Q8H  . pantoprazole (PROTONIX) IV  40 mg Intravenous Q12H  . potassium chloride  10 mEq Intravenous Q1 Hr x 4   Infusions:  . sodium chloride 40 mL/hr at 08/17/14 0326  . norepinephrine (LEVOPHED) Adult infusion 2 mcg/min (08/17/14 0840)  . octreotide  (SANDOSTATIN)    IV infusion 50 mcg/hr (08/17/14 0326)   PRN: albuterol, fentaNYL (SUBLIMAZE) injection, [DISCONTINUED] ondansetron **OR** ondansetron (ZOFRAN) IV Assessment: 63yoM with HIV, HCV, ETOh abuse, liver failure and ascites who was recently  admitted to [facility] with SOB and was noted to have a liver mass consistent with hepatocellular carcinoma. He discharged on Coumadin for portal vein thrombosis. He returns to the ER with hematemesis and melena. Vit K and Kcentra given in ED. Patient was intubated on 7/12 prior to EGD. In the afternoon, pt became profoundly hypotensive. Levaquin to be started for intra-abdominal infection. Pt is also on concurrent Flagyl.  Goal of Therapy:  Eradication of infection  Plan:  For CrCl >55mls/min, increase levaquin 750mg  IV to q24h Will f/u Scr and adjust dose as needed.  Dolly Rias RPh 08/17/2014, 11:02 AM Pager [phone]

## 2014-08-17 NOTE — Consult Note (Signed)
Consultation Note Date: 08/17/2014   Patient Name: Logan Combs  DOB: [birth year]  MRN: [id number]  Age / Sex: 64 y.o., male   PCP: Iona Beard, MD Referring Physician: Raylene Miyamoto, MD  Reason for Consultation: Establishing goals of care Life limiting illness: Sequela of chronic liver disease, recent ventilator dependent respiratory failure, GI bleed, and STEMI secondary to demand ischemia, acute renal failure  Palliative Care Assessment and Plan Summary of Established Goals of Care and Medical Treatment Preferences   64 yo male h/o hiv, hep c, cirrhosis of the liver, previous heavy etoh use. Patient has been admitted with hematemesis and melena. Patient has a recent admission for portal vein thrombosis and liver mass consistent with hepatocellular carcinoma. The patient is currently in intensive care unit at [facility]. The patient underwent an EGD and banding of varices in this hospitalization. He was also worked up with a surface echocardiogram showing ejection fraction 85-46 percent, diastolic dysfunction. Patient was recently hospitalized in the first week of July 2016 for community-acquired pneumonia cirrhosis portal vein hypertension. He was readmitted on 08-12-14 with hemorrhagic shock, and STEMI, demand ischemia. Patient also was with ventilator dependent respiratory failure. Patient remained in the ICU on pressors. Goals of care discussions ensued between the primary pulmonary/critical care team and the patient's family members namely his brother join sister Silva Bandy. DO NOT RESUSCITATE was deemed most medically appropriate. Patient had episode of vomiting and hematemesis overnight on 08-16-14. Early in a.m. on 08-17-14, the patient self extubated.  Palliative care consultation has been requested for further goals of care discussions. Currently, the patient is resting in bed. He looks older than his stated age. He looks chronically ill appearing. For the moment, he is  supporting the work of his breathing. He appears in mild-moderate distress because of abdominal discomfort. He does have mild degree of ascites. Abdomen is firm. Discussed with bedside RN. Patient is to be given when necessary dose of fentanyl.  Call placed and case discussed in detail with Sr. Phyllis at [phone]. Patient's various multiple medical conditions and poor prognosis discussed in detail. Discussed that the patient's life expectancy may be limited to at the most to few days to feel weeks. Discussed DO NOT INTUBATE in addition to DO NOT RESUSCITATE, discussed institution of. Of comfort measures only, discussed any sedation of hospice services, discussed appropriateness of transfer to inpatient hospice facility such as the [facility] in [place], [place].  Plan:   Patient's brother join sister Silva Bandy her coming to the hospital later this afternoon on 08-17-14. All of the above will be discussed in a face-to-face fashion. Further recommendations will follow. Continue current treatments for now.  Contacts/Participants in Discussion: Primary Decision Maker: Patient, who has limited insight. Then his brother Wille Glaser at [phone] and sister Silva Bandy at [phone].   HCPOA: yes    Code Status/Advance Care Planning:  CODE STATUS established as DO NOT RESUSCITATE. Discussed with sister Silva Bandy about DO NOT INTUBATE as well, especially in light of the patient's self extubating himself in a.m. on 08-17-14.  Symptom Management:   Continue fentanyl IV when necessary. Patient may also need benzodiazepines probably on a scheduled basis. We'll continue to monitor.  Palliative Prophylaxis: Yes  Additional Recommendations (Limitations, Scope, Preferences):  Discussed with sister Silva Bandy in detail over the phone in a.m. on 08-17-14 at [phone].   Plan: Patient's brother Wille Glaser and sister Kirstie Mirza arriving to [facility] later today. Palliative provider will meet with them for  a family  meeting for goals of care discussion. Other recommendations will follow. Psycho-social/Spiritual:   Support System: Strong. Patient's children ( 3 sons, one daughter who are all from out of state) have recently visited him according to sister Silva Bandy. Brother Wille Glaser and sister Silva Bandy her primary decision makers and support system and caregivers.  Desire for further Chaplaincy support:no  Prognosis: < 2 weeks  Discharge Planning:  Pending further discussions and family meeting which is to be held later today. Patient's sister Silva Bandy however has reached out to [facility] and has taken it for off inpatient hospice facility in [place] on 08-16-14.   Domains of Care: - Physical: Complains of abdominal pain, severe, generalized - Psychological: He is able to speak in simple sentences is able to answer a few questions appropriately. Appears in mild to moderate degree of distress. - Social: Patient is divorced from his wife. She lives in [place]. Patient has 3 sons and 1 daughter. All of his children live out of state. Patient's primary caregivers are his sister Silva Bandy and his brother Wille Glaser. - Spiritual: No acute issues noted an initial encounter. Will continue to engage. - Cultural: Patient's sister Silva Bandy states that he was in the process of relocating to [place], [place] to be closer to sister [place]. Patient has lost a close friend recently. - Imminently dying: No but prognosis is extremely guarded given multiple acute and chronic medical comorbidities. Discussed with sister. - Ethical/Legal: After discussions with sister Silva Bandy, patient's CODE STATUS was established as DO NOT RESUSCITATE on 08-16-14. There is an established healthcare power of attorney agent. Patient's sister Silva Bandy states that there is an actual document. It lists both brother Wille Glaser and sister Silva Bandy as Geophysicist/field seismologist.  Values: Shifting towards establishing comfort as a singular  primary goal Life limiting illness: Cirrhosis of the liver, status post ventilator dependent respiratory failure, recent acute renal failure, history of HIV, EtOH use      Chief Complaint/History of Present Illness: Vomiting blood  Primary Diagnoses  Present on Admission:  . Upper GI bleed . (Resolved) Warfarin-induced coagulopathy . Human immunodeficiency virus (HIV) disease . Liver mass . (Resolved) Depression . Alcohol consumption heavy . (Resolved) Microcytic anemia . Leukocytosis . Anemia of chronic disease . ARF (acute renal failure) . HTN (hypertension), benign . Hepatitis C . (Resolved) Hepatoma . Protein-calorie malnutrition, severe  Palliative Review of Systems: Completed I have reviewed the medical record, interviewed the patient and family, and examined the patient. The following aspects are pertinent.  Past Medical History  Diagnosis Date  . HIV (human immunodeficiency virus infection)   . Hepatitis C    History   Social History  . Marital Status: Single    Spouse Name: N/A  . Number of Children: N/A  . Years of Education: N/A   Social History Main Topics  . Smoking status: Former Smoker    Quit date: 04/27/2014  . Smokeless tobacco: Never Used  . Alcohol Use: 13.2 oz/week    21 Standard drinks or equivalent, 1 Glasses of wine per week  . Drug Use: No  . Sexual Activity:    Partners: Male     Comment: declined condoms   Other Topics Concern  . None   Social History Narrative   History reviewed. No pertinent family history. Scheduled Meds: . antiseptic oral rinse  7 mL Mouth Rinse QID  . chlorhexidine  15 mL Mouth Rinse BID  . dolutegravir  50 mg Oral Daily  . emtricitabine-tenofovir  1 tablet Per  Tube Q48H  . hydrocortisone sod succinate (SOLU-CORTEF) inj  100 mg Intravenous Q8H  . insulin aspart  0-15 Units Subcutaneous 6 times per day  . lactulose  30 g Oral BID  . levofloxacin (LEVAQUIN) IV  750 mg Intravenous Q48H  . metronidazole   250 mg Intravenous Q8H  . pantoprazole (PROTONIX) IV  40 mg Intravenous Q12H   Continuous Infusions: . sodium chloride 40 mL/hr at 08/17/14 0326  . norepinephrine (LEVOPHED) Adult infusion 2 mcg/min (08/17/14 0840)  . octreotide  (SANDOSTATIN)    IV infusion 50 mcg/hr (08/17/14 0326)   PRN Meds:.albuterol, fentaNYL (SUBLIMAZE) injection, [DISCONTINUED] ondansetron **OR** ondansetron (ZOFRAN) IV Medications Prior to Admission:  Prior to Admission medications   Medication Sig Start Date End Date Taking? Authorizing Provider  dolutegravir (TIVICAY) 50 MG tablet Take 1 tablet (50 mg total) by mouth daily. 08/09/14  Yes Michel Bickers, MD  emtricitabine-tenofovir (TRUVADA) 200-300 MG per tablet Take 1 tablet by mouth daily. 08/09/14  Yes Michel Bickers, MD  enoxaparin (LOVENOX) 60 MG/0.6ML injection Inject 0.6 mLs (60 mg total) into the skin every 12 (twelve) hours. 08/09/14  Yes Orson Eva, MD  furosemide (LASIX) 20 MG tablet Take 1 tablet (20 mg total) by mouth daily. 08/09/14  Yes Orson Eva, MD  levofloxacin (LEVAQUIN) 750 MG tablet Take 1 tablet (750 mg total) by mouth daily. 08/09/14  Yes Orson Eva, MD  Omega-3 Fatty Acids (OMEGA 3 PO) Take 2 capsules by mouth daily.   Yes Historical Provider, MD  Probiotic Product (PROBIOTIC DAILY PO) Take 1 tablet by mouth daily.   Yes Historical Provider, MD  spironolactone (ALDACTONE) 50 MG tablet Take 1 tablet (50 mg total) by mouth daily. 08/09/14  Yes Orson Eva, MD  warfarin (COUMADIN) 2.5 MG tablet Take 1 tablet (2.5 mg total) by mouth daily. 08/09/14  Yes Orson Eva, MD   Allergies  Allergen Reactions  . Penicillins Anaphylaxis  . Codeine Hives and Itching   CBC:    Component Value Date/Time   WBC 13.3* 08/17/2014 0356   HGB 10.1* 08/17/2014 0356   HCT 29.8* 08/17/2014 0356   PLT 107* 08/17/2014 0356   MCV 85.9 08/17/2014 0356   NEUTROABS 3.4 08/05/2014 1938   LYMPHSABS 1.8 08/05/2014 1938   MONOABS 0.8 08/05/2014 1938   EOSABS 0.3 08/05/2014 1938    BASOSABS 0.1 08/05/2014 1938   Comprehensive Metabolic Panel:    Component Value Date/Time   NA 148* 08/17/2014 0356   K 3.0* 08/17/2014 0356   CL 118* 08/17/2014 0356   CO2 21* 08/17/2014 0356   BUN 45* 08/17/2014 0356   CREATININE 1.29* 08/17/2014 0356   CREATININE 0.94 03/12/2014 1118   GLUCOSE 126* 08/17/2014 0356   CALCIUM 8.0* 08/17/2014 0356   AST 272* 08/17/2014 0356   ALT 206* 08/17/2014 0356   ALKPHOS 201* 08/17/2014 0356   BILITOT 2.3* 08/17/2014 0356   PROT 6.0* 08/17/2014 0356   ALBUMIN 2.2* 08/17/2014 0356    Physical Exam: Vital Signs: BP 102/63 mmHg  Pulse 76  Temp(Src) 97.7 F (36.5 C) (Core (Comment))  Resp 13  Ht 5\' 9"  (1.753 m)  Wt 63 kg (138 lb 14.2 oz)  BMI 20.50 kg/m2  SpO2 100% SpO2: SpO2: 100 % O2 Device: O2 Device: Ventilator O2 Flow Rate:   Intake/output summary:  Intake/Output Summary (Last 24 hours) at 08/17/14 0856 Last data filed at 08/17/14 0600  Gross per 24 hour  Intake 2152.24 ml  Output   1580 ml  Net 572.24 ml  LBM: Last BM Date: 08/16/14 Baseline Weight: Weight: 54.1 kg (119 lb 4.3 oz) Most recent weight: Weight: 63 kg (138 lb 14.2 oz)  Exam Findings:  Frail older than stated age appearing chronically ill appearing gentleman Poor dentition, edentulous Neck with anterior dressing Coarse breath sounds anterior lung fields S1-S2 Abdomen firm mild to moderate distention Patient is awake is able to nod yes or no is able to answer a few simple questions                       Palliative Performance Scale: 10% Additional Data Reviewed: Recent Labs     08/16/14  0336  08/17/14  0356  WBC  15.2*  13.3*  HGB  10.5*  10.1*  PLT  107*  107*  NA  143  148*  BUN  43*  45*  CREATININE  1.38*  1.29*     Time In: 0800 Time Out: 0855 Time Total: 55 min Greater than 50%  of this time was spent counseling and coordinating care related to the above assessment and plan.  Signed by: Loistine Chance, MD [phone] Loistine Chance, MD  08/17/2014, 8:56 AM  Please contact Palliative Medicine Team phone at [phone] for questions and concerns.

## 2014-08-17 NOTE — Evaluation (Signed)
Clinical/Bedside Swallow Evaluation Patient Details  Name: Logan Combs MRN: [id number] Date of Birth: 11-28-1950  Today's Date: 08/17/2014 Time: SLP Start Time (ACUTE ONLY): 4193 SLP Stop Time (ACUTE ONLY): 1315 SLP Time Calculation (min) (ACUTE ONLY): 30 min  Past Medical History:  Past Medical History  Diagnosis Date  . HIV (human immunodeficiency virus infection)   . Hepatitis C    Past Surgical History:  Past Surgical History  Procedure Laterality Date  . No past surgeries    . Esophagogastroduodenoscopy N/A 08/13/2014    Procedure: ESOPHAGOGASTRODUODENOSCOPY (EGD);  Surgeon: Clarene Essex, MD;  Location: [facility];  Service: Endoscopy;  Laterality: N/A;   HPI:  64 y.o. male with bellow PMH who was recently started on warfarin, bridged with low molecular weight heparin, due to portal vein thrombosis. Patient stated that since Friday he has been having progressively worse abdominal pain with nausea, decreased appetite, but no vomiting. Earlier today, he is nausea got worse to the point that he had several hematemesis episodes. He denies having melena or previous UGI bleed. He denies having any recent alcohol consumption. He states that he has been compliant with his medications. He denies use of ASA or any other NSAIDs. However the patient doesn't provide many details about his history or his symptomatology. GI and critical care had been consulted and saw the patient while he was in the emergency room. He is in no acute distress and stated that he feels better after the blood transfusions. Pt self-extubated at 8 am 08/17/14 and ST consulted d/t NPO status.   Assessment / Plan / Recommendation Clinical Impression   Pt with no overt s/s of aspiration with thin via small sips and puree (1/3 tsp amounts) despite post-extubation at 8 am 08/17/14; pt did exhibit multiple swallows with initial swallow of puree and thin liquid, but this was not noted with remainder of intake during BSE; vocal  quality was good with decreased intensity noted; weak cough noted, but swallow volitional with cues after one incidence of oral holding noted; Gastroenterologist stated clear liquid diet safest d/t underlying medical co-morbidities on 08/17/14 progress note; ST to f/u for possible diet upgrade as medical condition improves; recommend small bites and sips and medication administered with small sips, no straws    Aspiration Risk  Mild    Diet Recommendation Thin(clear liquids per gastroenterologist)   Medication Administration: Crushed with puree Compensations: Slow rate;Small sips/bites    Other  Recommendations Oral Care Recommendations: Oral care BID   Follow Up Recommendations    n/a   Frequency and Duration min 2x/week  1 week   Pertinent Vitals/Pain WDL    SLP Swallow Goals  See POC   Swallow Study Prior Functional Status   Modified independent at home    General Date of Onset: 08/13/14 Other Pertinent Information: 64 y.o. male with bellow PMH who was recently started on warfarin, bridged with low molecular weight heparin, due to portal vein thrombosis. Patient stated that since Friday he has been having progressively worse abdominal pain with nausea, decreased appetite, but no vomiting. Earlier today, he is nausea got worse to the point that he had several hematemesis episodes. He denies having melena or previous UGI bleed. He denies having any recent alcohol consumption. He states that he has been compliant with his medications. He denies use of ASA or any other NSAIDs. However the patient doesn't provide many details about his history or his symptomatology. GI and critical care had been consulted and saw the patient  while he was in the emergency room. He is in no acute distress and stated that he feels better after the blood transfusions. Type of Study: Bedside swallow evaluation Previous Swallow Assessment: n/a Diet Prior to this Study: NPO Temperature Spikes Noted:  No Respiratory Status: Room air History of Recent Intubation: Yes Length of Intubations (days): 4 days Date extubated: 08/17/14 Behavior/Cognition: Alert;Cooperative Oral Cavity - Dentition: Poor condition;Missing dentition Self-Feeding Abilities: Able to feed self;Needs assist Patient Positioning: Upright in bed Baseline Vocal Quality: Hoarse;Low vocal intensity Volitional Cough: Weak Volitional Swallow: Able to elicit    Oral/Motor/Sensory Function Overall Oral Motor/Sensory Function: Appears within functional limits for tasks assessed Mandible: Impaired   Ice Chips Ice chips: Not tested   Thin Liquid Thin Liquid: Impaired Presentation: Cup Oral Phase Functional Implications: Oral holding Pharyngeal  Phase Impairments: Multiple swallows    Nectar Thick Nectar Thick Liquid: Not tested   Honey Thick Honey Thick Liquid: Not tested   Puree Puree: Impaired Presentation: Spoon Oral Phase Impairments: Impaired anterior to posterior transit Oral Phase Functional Implications: Oral holding Pharyngeal Phase Impairments: Multiple swallows   Solid       Solid: Impaired Presentation: Spoon Oral Phase Impairments: Impaired anterior to posterior transit;Impaired mastication Oral Phase Functional Implications: Oral holding;Other (comment) (oral expulsion)       Kamerin Axford,PAT, M.S., CCC-SLP 08/17/2014,1:40 PM

## 2014-08-18 DIAGNOSIS — E43 Unspecified severe protein-calorie malnutrition: Secondary | ICD-10-CM

## 2014-08-18 LAB — CBC
HCT: 29.8 % — ABNORMAL LOW (ref 39.0–52.0)
Hemoglobin: 10.1 g/dL — ABNORMAL LOW (ref 13.0–17.0)
MCH: 30.9 pg (ref 26.0–34.0)
MCHC: 33.9 g/dL (ref 30.0–36.0)
MCV: 91.1 fL (ref 78.0–100.0)
PLATELETS: 108 10*3/uL — AB (ref 150–400)
RBC: 3.27 MIL/uL — ABNORMAL LOW (ref 4.22–5.81)
RDW: 20.3 % — ABNORMAL HIGH (ref 11.5–15.5)
WBC: 12.2 10*3/uL — AB (ref 4.0–10.5)

## 2014-08-18 LAB — BASIC METABOLIC PANEL
ANION GAP: 7 (ref 5–15)
BUN: 52 mg/dL — AB (ref 6–20)
CALCIUM: 8.2 mg/dL — AB (ref 8.9–10.3)
CO2: 21 mmol/L — ABNORMAL LOW (ref 22–32)
Chloride: 123 mmol/L — ABNORMAL HIGH (ref 101–111)
Creatinine, Ser: 1.39 mg/dL — ABNORMAL HIGH (ref 0.61–1.24)
GFR, EST NON AFRICAN AMERICAN: 52 mL/min — AB (ref >60)
Glucose, Bld: 118 mg/dL — ABNORMAL HIGH (ref 65–99)
Potassium: 3.5 mmol/L (ref 3.5–5.1)
Sodium: 151 mmol/L — ABNORMAL HIGH (ref 135–145)

## 2014-08-18 LAB — GLUCOSE, CAPILLARY
GLUCOSE-CAPILLARY: 125 mg/dL — AB (ref 65–99)
GLUCOSE-CAPILLARY: 122 mg/dL — AB (ref 65–99)
GLUCOSE-CAPILLARY: 105 mg/dL — AB (ref 65–99)
Glucose-Capillary: 104 mg/dL — ABNORMAL HIGH (ref 65–99)
Glucose-Capillary: 117 mg/dL — ABNORMAL HIGH (ref 65–99)
Glucose-Capillary: 127 mg/dL — ABNORMAL HIGH (ref 65–99)
Glucose-Capillary: 132 mg/dL — ABNORMAL HIGH (ref 65–99)
Glucose-Capillary: 135 mg/dL — ABNORMAL HIGH (ref 65–99)
Glucose-Capillary: 99 mg/dL (ref 65–99)

## 2014-08-18 MED ORDER — SODIUM CHLORIDE 0.45 % IV SOLN
INTRAVENOUS | Status: DC
  Administered 2014-08-18: 1000 mL via INTRAVENOUS

## 2014-08-18 MED ORDER — FENTANYL CITRATE (PF) 100 MCG/2ML IJ SOLN
25.0000 ug | INTRAMUSCULAR | Status: DC | PRN
  Administered 2014-08-18 – 2014-08-19 (×4): 25 ug via INTRAVENOUS
  Filled 2014-08-18 (×5): qty 2

## 2014-08-18 MED ORDER — MIDAZOLAM HCL 2 MG/2ML IJ SOLN
2.0000 mg | INTRAMUSCULAR | Status: DC | PRN
  Administered 2014-08-18: 2 mg via INTRAVENOUS
  Filled 2014-08-18: qty 2

## 2014-08-18 MED ORDER — FENTANYL CITRATE (PF) 100 MCG/2ML IJ SOLN
25.0000 ug | Freq: Once | INTRAMUSCULAR | Status: AC
  Administered 2014-08-18: 25 ug via INTRAVENOUS
  Filled 2014-08-18: qty 2

## 2014-08-18 NOTE — Progress Notes (Signed)
Logan Combs 12:00 PM  Subjective: The patient is doing much better without signs of bleeding and answers questions appropriately and is swallowing his medicine and his main complaints are wanting his Foley out and to sit on a bedside commode although he is too weak to get out of bed  Objective: Vital signs stable afebrile much more alert although still a little lethargic lungs are clear anteriorly decreased heart sounds abdomen increased ascites no obvious tenderness good bowel sounds stools are yellow per the nurse hemoglobin stable BUN and creatinine slight increased no new liver tests  Assessment: Improved  Plan: We rediscussed his endoscopy and findings and would recommend repeat endoscopy when necessary signs of bleeding otherwise okay to slowly wean octreotide and consider a paracentesis at some point and we will follow with you  [facility] E  Pager [phone] After 5PM or if no answer call [phone]

## 2014-08-18 NOTE — Progress Notes (Addendum)
PULMONARY / CRITICAL CARE MEDICINE   Name: Logan Combs MRN: [id number] DOB: [birth year]    ADMISSION DATE:  08/12/2014 CONSULTATION DATE:  08/12/2014  REFERRING MD :  Triad  CHIEF COMPLAINT:  Hematemesis  INITIAL PRESENTATION:  64 yo male with hematemesis and melena.  He has hx of HIV, Hep C, ETOH with cirrhosis/ascites.  Had recent admit for portal vein thrombosis and liver mass consistent with hepatocellular carcinoma.  STUDIES:  7/11 CT c spine >> no acute injury; multilevel degenerative disc disease 7/12 EGD >> banding of varices 7/13 Echo >> EF 45 to 80%, grade 1 diastolic dysfx  SIGNIFICANT EVENTS: 7/5-7/8 > admission for CAP, cirrhosis, portal vein thrombosis 7/11 Re admit, [facility], GI consult >> EGD and VDRF; hemorrhagic shock with NSTEMI from demand ischemia 7/13 Goals of care d/w family >> continue medical therapy, DNR if arrests 7/16 self extubated  SUBJECTIVE: Weaned off vasopressors now. Does report some abdominal pain. Some melena overnight. No bright red blood. Reports some mild dyspnea. Denies any chest pain.   VITAL SIGNS: Temp:  [95.5 F (35.3 C)-97.7 F (36.5 C)] 97.2 F (36.2 C) (07/17 0400) Pulse Rate:  [113] 113 (07/16 2041) Resp:  [9-21] 19 (07/17 0500) BP: (85-139)/(57-101) 115/78 mmHg (07/17 0500) SpO2:  [92 %-100 %] 93 % (07/17 0500) Arterial Line BP: (110-138)/(74-88) 117/76 mmHg (07/16 1100) INTAKE / OUTPUT:  Intake/Output Summary (Last 24 hours) at 08/18/14 0812 Last data filed at 08/18/14 0500  Gross per 24 hour  Intake 1927.38 ml  Output    610 ml  Net 1317.38 ml    PHYSICAL EXAMINATION: General:  Awake. No acute distress. Sitting in bed staring into his couple of water Integument: Warm and dry. No rash on exposed skin. Right IJ Cordis in place. HEENT:  tachycardias membranes. No oral ulcers. No scleral injection. PERRL. Cardiovascular:  Regular rate. No appreciable JVD.  Pulmonary:  Good aeration & clear to auscultation  bilaterally. Symmetric chest wall expansion. No accessory muscle use. Abdomen: Soft. Normal bowel sounds.  distended. Neurological:  CN 2-12 grossly in tact. No meningismus. mild tremor. Moving all 4 extremities equally.   LABS:  CBC  Recent Labs Lab 08/16/14 0336 08/17/14 0356 08/18/14 0447  WBC 15.2* 13.3* 12.2*  HGB 10.5* 10.1* 10.1*  HCT 29.6* 29.8* 29.8*  PLT 107* 107* 108*   Coag's  Recent Labs Lab 08/14/14 0415 08/15/14 0500 08/17/14 0356  INR 1.46 1.66* 1.44     BMET  Recent Labs Lab 08/16/14 0336 08/17/14 0356 08/18/14 0447  NA 143 148* 151*  K 3.3* 3.0* 3.5  CL 113* 118* 123*  CO2 23 21* 21*  BUN 43* 45* 52*  CREATININE 1.38* 1.29* 1.39*  GLUCOSE 149* 126* 118*   Electrolytes  Recent Labs Lab 08/13/14 0343  08/14/14 0415  08/16/14 0336 08/17/14 0356 08/18/14 0447  CALCIUM 7.8*  < > 6.9*  < > 7.5* 8.0* 8.2*  MG 2.2  --  1.9  --  1.9 2.0  --   PHOS 5.8*  --  3.9  --   --   --   --   < > = values in this interval not displayed.   Sepsis Markers  Recent Labs Lab 08/12/14 1741 08/13/14 0343 08/13/14 2315  LATICACIDVEN 8.34* 1.9 2.0    Recent Labs Lab 08/13/14 1825 08/14/14 0055 08/14/14 0610  TROPONINI 7.43* >65.00* >65.00*    Liver Enzymes  Recent Labs Lab 08/15/14 0500 08/16/14 0336 08/17/14 0356  AST 980* 579* 272*  ALT 468* 322* 206*  ALKPHOS 299* 260* 201*  BILITOT 2.7* 3.4* 2.3*  ALBUMIN 1.9* 2.0* 2.2*   Glucose  Recent Labs Lab 08/17/14 1222 08/17/14 1604 08/17/14 2038 08/18/14 0042 08/18/14 0424 08/18/14 0751  GLUCAP 125* 133* 111* 135* 122* 105*    Imaging Dg Abd 1 View  08/16/2014   CLINICAL DATA:  Gastric tube granulation  EXAM: ABDOMEN - 1 VIEW  COMPARISON:  None.  FINDINGS: The bowel gas pattern is normal. There is NG-tube with tip in distal stomach. Small calcifications in right upper quadrant may represent gallstones.  IMPRESSION: Normal small bowel gas pattern.  NG tube with tip in distal  stomach.   Electronically Signed   By: Lahoma Crocker M.D.   On: 08/16/2014 16:45   Dg Chest Port 1 View  08/17/2014   CLINICAL DATA:  Respiratory failure.  History of smoking.  EXAM: PORTABLE CHEST - 1 VIEW  COMPARISON:  08/16/2014  FINDINGS: Right IJ central line sheath remains in place, tip overlying the superior vena cava. Endotracheal tube is/ in place, tip approximately 5.5 cm above carina. Orogastric tube tip is beyond the image but also beyond the gastroesophageal junction.  Heart size is normal. Persistent lingular and right middle lobe atelectasis. There has been improvement in aeration of the left lung base. No pulmonary edema.  IMPRESSION: 1. Interval placement of nasogastric tube. 2. Improved aeration.   Electronically Signed   By: Nolon Nations M.D.   On: 08/17/2014 07:20     ASSESSMENT / PLAN:  PULMONARY ETT 7/12 >>>7/16 A: Acute resp failure - airway protection 2nd to Upper GI bleed.  P:   No reintubation   CARDIOVASCULAR Rt IJ cordis 7/12 >>> A:  Hemorrhagic shock 2nd to Upper GI bleeding. Acute systolic heart failure from NSTEMI 2nd to demand ischemia.  P: Wean pressors to keep MAP > 55, SBP > 90 in setting of cirrhosis No ASA, beta blocker due to bleeding and hypotension Defer cardiology assessment for now Hold outpt lasix, aldactone  RENAL A:   Lactic acidosis. Non anion gap metabolic acidosis 2nd to hyperchloremia and AKI >> resolved. AKI - improving  Hypokalemia - resolved Hypernatremia  P:   Switching IVF to 1/2 NS Continue to trend electrolytes  Following urine output  Follow daily BUN/creatinine   GASTROINTESTINAL A:   Upper GI bleeding 2nd to esophageal varices s/p clipping. 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 Portal vein thrombosis Liver mass consistent with hepatocellular carcinoma -note mRI 08/07/14 Shock liver. Severe protein calorie malnutrition.  P:   Transitioned to bid protonix 7/14 Octreotide per GI Sips and  clears Repeat LFTs and coags and I am  HEMATOLOGIC A:   Acute blood loss anemia - due to varices. Coagulopathy - due to warfarin +/- cirrhosis. Portal vein thrombosis.  P: Continue to follow daily hemoglobin & coags. Transfuse for Hb < 7 or bleeding Defer anti-coagulation for now   INFECTIOUS A:   Fever 7/13 (Tm 101). Hx of HIV, Hep C.  P:   Antiretroviral medicines per ID 7/12 >> levaquin, flagyl per ID  ENDOCRINE A:   Sick euthyroid. Hyperglycemia.  P:   SSI with Accu-Cheks every 4 hours  NEUROLOGIC A:   Acute metabolic & hepatic encephalopathy.  P:   Discontinue Versed and fentanyl  RASS Goal: 0 Continuing lactulose twice a day  Summary: Remains extubated. Palliative care consulted and we're continuing to monitor for progress over the next 24 hours. Patient at this time is  DO NOT RESUSCITATE/DO NOT INTUBATE. High risk for clinical deterioration from his multisystem organ failure.   I have spent a total of 34 minutes of critical care time today caring for this patient and reviewing the patient's electronic medical record.  Sonia Baller Ashok Cordia, M.D. [facility] Pager:  [phone] After 3pm or if no response, call [phone]    08/18/2014, 8:12 AM

## 2014-08-18 NOTE — Progress Notes (Signed)
Daily Progress Note   Patient Name: Logan Combs       Date: 08/18/2014 DOB: [birth year]  Age: 64 y.o. MRN#: [id number] Attending Physician: Raylene Miyamoto, MD Primary Care Physician: [name], MD Admit Date: 08/12/2014 Life limiting illness: Sequela of chronic liver disease, recent ventilator dependent respiratory failure, GI bleed,  NSTEMI secondary to demand ischemia, acute renal failure Reason for Consultation/Follow-up: Establishing goals of care  Subjective:  resting in bed, complains of having diarrhea, complains of too many tubes, lines, tele leads. Wants to eat applesauce. Interval Events: Call placed and discussed with sister Silva Bandy. Also discussed with Dr Ashok Cordia  Overall goals shifting towards comfort and hospice arrangements. Will request hospice consult, evaluate for transfer to inpatient hospice,  Alternative disposition could be SNF with hospice if patient is not an inpatient hospice appropriate candidate.  Will add low dose fentanyl and versed PRN for symptom management of episodic pain, agitation, confusion. Leave IJ central line in for now, use for comfort meds PRN  Length of Stay: 6 days  Current Medications: Scheduled Meds:  . antiseptic oral rinse  7 mL Mouth Rinse QID  . chlorhexidine  15 mL Mouth Rinse BID  . dolutegravir  50 mg Oral Daily  . emtricitabine-tenofovir  1 tablet Per Tube Q48H  . folic acid  1 mg Intravenous Daily  . insulin aspart  0-15 Units Subcutaneous 6 times per day  . lactulose  30 g Oral BID  . levofloxacin (LEVAQUIN) IV  750 mg Intravenous Q24H  . metronidazole  250 mg Intravenous Q8H  . pantoprazole (PROTONIX) IV  40 mg Intravenous Q12H  . thiamine IV  100 mg Intravenous Q24H    Continuous Infusions: . sodium chloride 1,000 mL (08/18/14 0919)  . sodium chloride 1,000 mL (08/18/14 0921)  . norepinephrine (LEVOPHED) Adult infusion Stopped (08/17/14 1115)  . octreotide  (SANDOSTATIN)    IV infusion 50 mcg/hr (08/18/14  0850)    PRN Meds: albuterol, fentaNYL (SUBLIMAZE) injection, midazolam, [DISCONTINUED] ondansetron **OR** ondansetron (ZOFRAN) IV  Palliative Performance Scale: 20%     Vital Signs: BP 134/93 mmHg  Pulse 113  Temp(Src) 97.2 F (36.2 C) (Core (Comment))  Resp 19  Ht 5\' 9"  (1.753 m)  Wt 63 kg (138 lb 14.2 oz)  BMI 20.50 kg/m2  SpO2 97% SpO2: SpO2: 97 % O2 Device: O2 Device: Not Delivered O2 Flow Rate: O2 Flow Rate (L/min): 4 L/min  Intake/output summary:  Intake/Output Summary (Last 24 hours) at 08/18/14 1044 Last data filed at 08/18/14 0800  Gross per 24 hour  Intake 1739.28 ml  Output    990 ml  Net 749.28 ml   LBM:   Baseline Weight: Weight: 54.1 kg (119 lb 4.3 oz) Most recent weight: Weight: 63 kg (138 lb 14.2 oz)  Physical Exam:  Frail NAD Poor dentition, edentulous Neck with anterior dressing Coarse breath sounds anterior lung fields S1-S2 Abdomen firm mild to moderate distention Patient is awake alert conversing  Additional Data Reviewed: Recent Labs     08/17/14  0356  08/18/14  0447  WBC  13.3*  12.2*  HGB  10.1*  10.1*  PLT  107*  108*  NA  148*  151*  BUN  45*  52*  CREATININE  1.29*  1.39*     Problem List:  Patient Active Problem List   Diagnosis Date Noted  . Abdominal pain, generalized   . Encounter for palliative care   . Leukocytosis 08/13/2014  . Anemia of chronic  disease 08/13/2014  . ARF (acute renal failure) 08/13/2014  . HTN (hypertension), benign 08/13/2014  . Hepatitis C 08/13/2014  . Protein-calorie malnutrition, severe 08/13/2014  . Acute posthemorrhagic anemia   . Endotracheal tube present   . Hematemesis with nausea   . Upper GI bleed 08/12/2014  . Liver mass   . Alcohol consumption heavy 07/23/2013  . Human immunodeficiency virus (HIV) disease 11/03/2005  . Acute hepatitis C virus infection 11/03/2005     Palliative Care Assessment & Plan    Code Status:  DNR  Goals of Care:   comfort measures with  some gentle treatments  Hospice consult  Desire for further Chaplaincy support:no  3. Symptom Management:   fentanyl and versed IV PRN  4. Palliative Prophylaxis:  Stool Softener: patient currently with diarrhea  5. Prognosis: < 2 weeks  5. Discharge Planning: Hospice facility   Care plan was discussed with  Patient, MD Dr Ashok Cordia, sister Silva Bandy.   Thank you for allowing the Palliative Medicine Team to assist in the care of this patient.   Time In: 0900 Time Out: 0935 Total Time 35 min Prolonged Time Billed  no     Greater than 50%  of this time was spent counseling and coordinating care related to the above assessment and plan.   Loistine Chance, MD  08/18/2014, 10:44 AM  Please contact Palliative Medicine Team phone at [phone] for questions and concerns.  [phone]

## 2014-08-19 DIAGNOSIS — I214 Non-ST elevation (NSTEMI) myocardial infarction: Secondary | ICD-10-CM

## 2014-08-19 DIAGNOSIS — B20 Human immunodeficiency virus [HIV] disease: Secondary | ICD-10-CM | POA: Insufficient documentation

## 2014-08-19 DIAGNOSIS — R29898 Other symptoms and signs involving the musculoskeletal system: Secondary | ICD-10-CM

## 2014-08-19 LAB — COMPREHENSIVE METABOLIC PANEL
ALT: 128 U/L — ABNORMAL HIGH (ref 17–63)
ANION GAP: 5 (ref 5–15)
AST: 123 U/L — AB (ref 15–41)
Albumin: 2 g/dL — ABNORMAL LOW (ref 3.5–5.0)
Alkaline Phosphatase: 180 U/L — ABNORMAL HIGH (ref 38–126)
BUN: 44 mg/dL — AB (ref 6–20)
CALCIUM: 8.4 mg/dL — AB (ref 8.9–10.3)
CHLORIDE: 123 mmol/L — AB (ref 101–111)
CO2: 19 mmol/L — ABNORMAL LOW (ref 22–32)
Creatinine, Ser: 1.34 mg/dL — ABNORMAL HIGH (ref 0.61–1.24)
GFR calc Af Amer: >60 mL/min (ref >60)
GFR calc non Af Amer: 55 mL/min — ABNORMAL LOW (ref >60)
Glucose, Bld: 109 mg/dL — ABNORMAL HIGH (ref 65–99)
Potassium: 3.2 mmol/L — ABNORMAL LOW (ref 3.5–5.1)
SODIUM: 147 mmol/L — AB (ref 135–145)
Total Bilirubin: 2.4 mg/dL — ABNORMAL HIGH (ref 0.3–1.2)
Total Protein: 6.3 g/dL — ABNORMAL LOW (ref 6.5–8.1)

## 2014-08-19 LAB — GLUCOSE, CAPILLARY
GLUCOSE-CAPILLARY: 90 mg/dL (ref 65–99)
GLUCOSE-CAPILLARY: 108 mg/dL — AB (ref 65–99)
GLUCOSE-CAPILLARY: 140 mg/dL — AB (ref 65–99)
GLUCOSE-CAPILLARY: 120 mg/dL — AB (ref 65–99)
Glucose-Capillary: 145 mg/dL — ABNORMAL HIGH (ref 65–99)

## 2014-08-19 LAB — CBC WITH DIFFERENTIAL/PLATELET
Basophils Absolute: 0 10*3/uL (ref 0.0–0.1)
Basophils Relative: 0 % (ref 0–1)
EOS ABS: 0.1 10*3/uL (ref 0.0–0.7)
Eosinophils Relative: 0 % (ref 0–5)
HEMATOCRIT: 32.3 % — AB (ref 39.0–52.0)
Hemoglobin: 10.7 g/dL — ABNORMAL LOW (ref 13.0–17.0)
LYMPHS PCT: 9 % — AB (ref 12–46)
Lymphs Abs: 1.2 10*3/uL (ref 0.7–4.0)
MCH: 29.7 pg (ref 26.0–34.0)
MCHC: 33.1 g/dL (ref 30.0–36.0)
MCV: 89.7 fL (ref 78.0–100.0)
MONO ABS: 1.1 10*3/uL — AB (ref 0.1–1.0)
Monocytes Relative: 8 % (ref 3–12)
Neutro Abs: 11.1 10*3/uL — ABNORMAL HIGH (ref 1.7–7.7)
Neutrophils Relative %: 83 % — ABNORMAL HIGH (ref 43–77)
PLATELETS: 115 10*3/uL — AB (ref 150–400)
RBC: 3.6 MIL/uL — ABNORMAL LOW (ref 4.22–5.81)
RDW: 20.2 % — ABNORMAL HIGH (ref 11.5–15.5)
WBC: 13.5 10*3/uL — ABNORMAL HIGH (ref 4.0–10.5)

## 2014-08-19 LAB — APTT: aPTT: 32 seconds (ref 24–37)

## 2014-08-19 LAB — PROTIME-INR
INR: 1.55 — ABNORMAL HIGH (ref 0.00–1.49)
Prothrombin Time: 18.7 seconds — ABNORMAL HIGH (ref 11.6–15.2)

## 2014-08-19 LAB — PHOSPHORUS: Phosphorus: 2.9 mg/dL (ref 2.5–4.6)

## 2014-08-19 LAB — MAGNESIUM: MAGNESIUM: 2 mg/dL (ref 1.7–2.4)

## 2014-08-19 MED ORDER — POTASSIUM CHLORIDE CRYS ER 20 MEQ PO TBCR
40.0000 meq | EXTENDED_RELEASE_TABLET | Freq: Once | ORAL | Status: AC
  Administered 2014-08-19: 40 meq via ORAL
  Filled 2014-08-19: qty 2

## 2014-08-19 MED ORDER — BOOST / RESOURCE BREEZE PO LIQD
1.0000 | Freq: Three times a day (TID) | ORAL | Status: DC
  Administered 2014-08-19 – 2014-08-21 (×4): 1 via ORAL

## 2014-08-19 MED ORDER — FENTANYL 25 MCG/HR TD PT72
25.0000 ug | MEDICATED_PATCH | TRANSDERMAL | Status: DC
  Administered 2014-08-19 – 2014-08-22 (×2): 25 ug via TRANSDERMAL
  Filled 2014-08-19 (×2): qty 1

## 2014-08-19 MED ORDER — ALBUTEROL SULFATE (2.5 MG/3ML) 0.083% IN NEBU: 2.5000 mg | INHALATION_SOLUTION | RESPIRATORY_TRACT | Status: DC | PRN

## 2014-08-19 MED ORDER — FENTANYL CITRATE (PF) 100 MCG/2ML IJ SOLN
25.0000 ug | INTRAMUSCULAR | Status: DC | PRN
  Administered 2014-08-19 (×4): 50 ug via INTRAVENOUS
  Administered 2014-08-20: 75 ug via INTRAVENOUS
  Filled 2014-08-19 (×4): qty 2

## 2014-08-19 NOTE — Progress Notes (Signed)
Date:  August 19, 2014 U.R. performed for needs and level of care. Will continue to follow for Case Management needs.  Rhonda Davis, RN, BSN, CCM   [phone] 

## 2014-08-19 NOTE — Progress Notes (Signed)
PULMONARY / CRITICAL CARE MEDICINE   Name: Logan Combs MRN: [id number] DOB: Sep 23, 1950    ADMISSION DATE:  08/12/2014 CONSULTATION DATE:  08/12/2014  REFERRING MD :  Triad  CHIEF COMPLAINT:  Hematemesis  INITIAL PRESENTATION:  64 y/o male with hematemesis and melena.  He has hx of HIV, Hep C, ETOH with cirrhosis/ascites. Recent admit for portal vein thrombosis and liver mass consistent with hepatocellular carcinoma.  Hospital course complicated by acute respiratory failure requiring intubation and NSTEMI from demand ischemia.  Self extubated on 7/16.  STUDIES:  7/11  CT c spine >> no acute injury; multilevel degenerative disc disease 7/12  EGD >> banding of varices 7/13  ECHO >> EF 45 to 40%, grade 1 diastolic dysfx  SIGNIFICANT EVENTS: 7/5-7/8  admission for CAP, cirrhosis, portal vein thrombosis 7/11  Re admit, [facility], GI consult >> EGD and VDRF; hemorrhagic shock with NSTEMI from demand ischemia 7/13  Goals of care d/w family >> continue medical therapy, DNR if arrests 7/16  Self extubated, weaned off vasopressors,  Some abd pain / melena overnight.   SUBJECTIVE:  Pt reports mild abdominal discomfort and SOB at rest.  VSS off vasopressors.    VITAL SIGNS: Temp:  [96.1 F (35.6 C)-97 F (36.1 C)] 96.8 F (36 C) (07/18 0800) Resp:  [13-25] 25 (07/18 0800) BP: (99-155)/(46-97) 118/79 mmHg (07/18 0800) SpO2:  [91 %-100 %] 94 % (07/18 0800)   INTAKE / OUTPUT:  Intake/Output Summary (Last 24 hours) at 08/19/14 1034 Last data filed at 08/19/14 0831  Gross per 24 hour  Intake 997.29 ml  Output    550 ml  Net 447.29 ml    PHYSICAL EXAMINATION: General:  Chronically ill appearing adult male in NAD sitting up in bed Integument: Warm and dry. No rash on exposed skin. Right IJ Cordis in place. HEENT:  Dry mucus membranes. No oral ulcers. No scleral injection. PERRL. Cardiovascular:  Regular rate. No appreciable JVD.  Pulmonary:  Even/non-labored, cear to auscultation  bilaterally. Symmetric chest wall expansion. No accessory muscle use. Abdomen: Soft. Normal bowel sounds.  distended. Neurological:  CN 2-12 grossly in tact. Mild tremor. Moving all 4 extremities equally.   LABS:  CBC  Recent Labs Lab 08/17/14 0356 08/18/14 0447 08/19/14 0400  WBC 13.3* 12.2* 13.5*  HGB 10.1* 10.1* 10.7*  HCT 29.8* 29.8* 32.3*  PLT 107* 108* 115*   Coag's  Recent Labs Lab 08/15/14 0500 08/17/14 0356 08/19/14 0400  APTT  --   --  32  INR 1.66* 1.44 1.55*    BMET  Recent Labs Lab 08/17/14 0356 08/18/14 0447 08/19/14 0400  NA 148* 151* 147*  K 3.0* 3.5 3.2*  CL 118* 123* 123*  CO2 21* 21* 19*  BUN 45* 52* 44*  CREATININE 1.29* 1.39* 1.34*  GLUCOSE 126* 118* 109*   Electrolytes  Recent Labs Lab 08/13/14 0343  08/14/14 0415  08/16/14 0336 08/17/14 0356 08/18/14 0447 08/19/14 0400  CALCIUM 7.8*  < > 6.9*  < > 7.5* 8.0* 8.2* 8.4*  MG 2.2  --  1.9  --  1.9 2.0  --  2.0  PHOS 5.8*  --  3.9  --   --   --   --  2.9  < > = values in this interval not displayed.   Sepsis Markers  Recent Labs Lab 08/12/14 1741 08/13/14 0343 08/13/14 2315  LATICACIDVEN 8.34* 1.9 2.0    Recent Labs Lab 08/13/14 1825 08/14/14 0055 08/14/14 0610  TROPONINI 7.43* >65.00* >65.00*  Liver Enzymes  Recent Labs Lab 08/16/14 0336 08/17/14 0356 08/19/14 0400  AST 579* 272* 123*  ALT 322* 206* 128*  ALKPHOS 260* 201* 180*  BILITOT 3.4* 2.3* 2.4*  ALBUMIN 2.0* 2.2* 2.0*   Glucose  Recent Labs Lab 08/18/14 0751 08/18/14 1138 08/18/14 1740 08/18/14 2000 08/18/14 2322 08/19/14 0818  GLUCAP 105* 117* 99 104* 127* 108*    Imaging No results found.   ASSESSMENT / PLAN:  PULMONARY ETT 7/12 >>>7/16 A: Acute Respiratory Failure - intubated for airway protection 2nd to Upper GI bleed. P:   Self-extubated DNR / DNI  Pulmonary hygiene   CARDIOVASCULAR Rt IJ cordis 7/12 >>  A:  Hemorrhagic shock 2nd to Upper GI bleeding - resolved. Acute  systolic heart failure from NSTEMI 2nd to demand ischemia. P: No ASA, beta blocker due to bleeding and hypotension Consider d/c cordis in am 7/19 if no further bleeding  Defer cardiology assessment for now Hold outpt lasix, aldactone  RENAL A:   Lactic acidosis - resolved Non anion gap metabolic acidosis 2nd to hyperchloremia and AKI >> resolved. AKI - improving  Hypokalemia  Hypernatremia P:   IVF to 1/2 NS Trend BMP / UOP  Replace electrolytes as indicated, KCL 40 mEq x1 7/18  GASTROINTESTINAL A:   Upper GI bleeding 2nd to esophageal varices s/p clipping. 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 Portal vein thrombosis Liver mass consistent with hepatocellular carcinoma -note MRI 08/07/14 Shock liver. Severe protein calorie malnutrition. P:   BID protonix 7/14 Sips and clears Follow LFT's, Coags Thiamine/Folate IV  HEMATOLOGIC A:   Acute blood loss anemia - due to varices. Coagulopathy - due to warfarin +/- cirrhosis. Portal vein thrombosis. P: Trend CBC  Transfuse for Hb < 7 or bleeding Defer anti-coagulation for now   INFECTIOUS A:   Fever 7/13 (Tm 101). Hx of HIV, Hep C. P:   Antiretroviral medicines per ID Levaquin, flagyl per ID  ENDOCRINE A:   Sick euthyroid. Hyperglycemia. P:   SSI with CBG every 4 hours  NEUROLOGIC A:   Acute metabolic & hepatic encephalopathy. P:   Discontinue Versed and fentanyl  RASS Goal: 0 Continuing lactulose twice a day  GLOBAL:  Tolerating extubation.  VSS off vasopressors.  SDU status given overall deconditioning and transfer to [facility] in am 7/19 0700.  Leave cordis in place x 24 more hours then discontinue if no further bleeding.  DNR / DNI.  Palliative care involved.  If he continues to remain in current clinical condition, he will likely need SNF with PT.  If declines, he would be an excellent candidate for hospice home.     Noe Gens, NP-C [facility] Pgr: [phone] or if no answer  [phone] 08/19/2014, 10:34 AM

## 2014-08-19 NOTE — Progress Notes (Signed)
Nutrition Brief Note  Chart reviewed. Pt now transitioning to comfort care.  No further nutrition interventions warranted at this time.  Please consult as needed.   Erez Mccallum, MS, RD, LDN Pager: [phone] After Hours Pager: [phone]    

## 2014-08-19 NOTE — Progress Notes (Signed)
Speech Language Pathology Treatment:    Patient Details Name: CANON GOLA MRN: [id number] DOB: 10/28/1950 Today's Date: 08/19/2014 Time: 3382-5053 SLP Time Calculation (min) (ACUTE ONLY): 25 min  Assessment / Plan / Recommendation Clinical Impression  Diet order placed today - over the weekend pt consumed ice chips. SLP follow up to assess po tolerance and educate to aspiration precautions.  Pt's voice remains weak but is clear - and pt reports improved phonatory strength since extubation. Observed pt with lunch tray - few bites of popsicle, entire cup of vegetable broth with no s/s of aspiration or residuals.  Pt also consumed 1 sip of tea and 1 sip of apple juice while SLP was out of room - he stated displeasure with all items besides broth. Inconsistent multiple swallows noted.    Pt denies difficulties with lunch but admitted to nausea after just a few boluses- informed Network engineer.  SlP provided pt with emesis basin and removed tray from table - lowering head slightly for comfort but also considering airway protection.    Recommend continue clears per GI, SLP will follow up once later in the week given pt just starting diet today and his voice remains weak.  Of note, pt reports taking Tums on occasion at home for reflux.     HPI Other Pertinent Information: 64 y.o. male with bellow PMH who was recently started on warfarin, bridged with low molecular weight heparin, due to portal vein thrombosis. Patient stated that since Friday he has been having progressively worse abdominal pain with nausea, decreased appetite, but no vomiting. Earlier today, he is nausea got worse to the point that he had several hematemesis episodes. He denies having melena or previous UGI bleed. He denies having any recent alcohol consumption. He states that he has been compliant with his medications. He denies use of ASA or any other NSAIDs. However the patient doesn't provide many details about his history or his  symptomatology. GI and critical care had been consulted and saw the patient while he was in the emergency room. He is in no acute distress and stated that he feels better after the blood transfusions.   Pertinent Vitals Pain Assessment: 0-10 Pain Score: 8  Pain Location: Lower Abdomen Pain Intervention(s): Monitored during session;Patient requesting pain meds-RN notified (SLP informed secretary, requested for RN to give medicine)  SLP Plan  Continue with current plan of care    Recommendations Diet recommendations: Thin liquid (clears per GI Md) Liquids provided via: Cup;No straw Medication Administration: Other (Comment) (as tolerated, pt reports he can take his medicine with juice) Supervision: Full supervision/cueing for compensatory strategies (initially full supervision to assure tolerance) Compensations: Slow rate;Small sips/bites Postural Changes and/or Swallow Maneuvers: Upright 30-60 min after meal              Oral Care Recommendations: Oral care BID Follow up Recommendations: None (none beyond acute venue) Plan: Continue with current plan of care    [place], [place], [place] [facility] [place] [phone]

## 2014-08-19 NOTE — Progress Notes (Signed)
Subjective: No further hematemesis or blood in stool. Extubated yesterday, tolerated ice chips. No abdominal pain.  Objective: Vital signs in last 24 hours: Temp:  [96.3 F (35.7 C)-97 F (36.1 C)] 97 F (36.1 C) (07/18 1000) Resp:  [13-25] 15 (07/18 1000) BP: (99-155)/(46-94) 124/81 mmHg (07/18 1000) SpO2:  [91 %-100 %] 99 % (07/18 1000) Weight change:  Last BM Date: 08/19/14  PE: GEN:  Cachectic, chronically ill-appearing SKIN:  Atrophic musculature, diffuse ecchymoses ABD:  Soft, mild ascites, non-tender  Lab Results: CBC    Component Value Date/Time   WBC 13.5* 08/19/2014 0400   RBC 3.60* 08/19/2014 0400   HGB 10.7* 08/19/2014 0400   HCT 32.3* 08/19/2014 0400   PLT 115* 08/19/2014 0400   MCV 89.7 08/19/2014 0400   MCH 29.7 08/19/2014 0400   MCHC 33.1 08/19/2014 0400   RDW 20.2* 08/19/2014 0400   LYMPHSABS 1.2 08/19/2014 0400   MONOABS 1.1* 08/19/2014 0400   EOSABS 0.1 08/19/2014 0400   BASOSABS 0.0 08/19/2014 0400   CMP     Component Value Date/Time   NA 147* 08/19/2014 0400   K 3.2* 08/19/2014 0400   CL 123* 08/19/2014 0400   CO2 19* 08/19/2014 0400   GLUCOSE 109* 08/19/2014 0400   BUN 44* 08/19/2014 0400   CREATININE 1.34* 08/19/2014 0400   CREATININE 0.94 03/12/2014 1118   CALCIUM 8.4* 08/19/2014 0400   PROT 6.3* 08/19/2014 0400   ALBUMIN 2.0* 08/19/2014 0400   AST 123* 08/19/2014 0400   ALT 128* 08/19/2014 0400   ALKPHOS 180* 08/19/2014 0400   BILITOT 2.4* 08/19/2014 0400   GFRNONAA 55* 08/19/2014 0400   GFRNONAA 76 07/09/2013 1441   GFRAA >60 08/19/2014 0400   GFRAA 88 07/09/2013 1441   INR 1.55  Assessment:  1.  HCV and alcohol-mediated cirrhosis, decompensated (ascites, bleeding). 2.  Esophageal variceal bleeding.  Resolved; no further bleeding since extubation yesterday. 3.  Abnormal liver lesion.  In setting of cirrhosis and markedly elevated AFP, this is consistent with hepatoma. 4.  Ascites.  Plan:  1.  Octreotide has been  stopped. 2.  Agree with clear liquid trial, slowly advance as tolerated. 3.  Don't see compelling need for paracentesis at the present time. 4.  Consider transition of PPI to oral formulation tomorrow. 5.  OOBTC, ambulate as tolerated. 6.  Likely OK to transition out of ICU tomorrow. 7.  Will follow.    Landry Dyke 08/19/2014, 11:03 AM   Pager [phone] If no answer or after 5 PM call [phone]

## 2014-08-19 NOTE — Progress Notes (Signed)
[facility] Progress Note Patient Name: Logan Combs DOB: 15-Jun-1950 MRN: [id number]   Date of Service  08/19/2014  HPI/Events of Note  Patient with pain unrelieved by current fentanyl/versed dosing.  eICU Interventions  Increased dose of fentanyl to 25 to 75 mcg IV prn     Intervention Category Intermediate Interventions: Pain - evaluation and management  DETERDING,ELIZABETH 08/19/2014, 2:33 AM

## 2014-08-19 NOTE — Progress Notes (Signed)
Daily Progress Note   Patient Name: Logan Combs       Date: 08/19/2014 DOB: 01/04/1951  Age: 64 y.o. MRN#: [id number] Attending Physician: Raylene Miyamoto, MD Primary Care Physician: [name], MD Admit Date: 08/12/2014 Life limiting illness: Sequela of chronic liver disease, recent ventilator dependent respiratory failure, GI bleed,  NSTEMI secondary to demand ischemia, acute renal failure Reason for Consultation/Follow-up: Establishing goals of care  Subjective:  resting in bed, complains of having diarrhea, abdominal pain and pain in his sides.   Recommendations: Consider OOB, PT consult Consider SNF rehab. Patient with gradual stabilization over the past few days.  Discussed with patient, CCM NP It does not appear that the patient is an inpatient hospice candidate this am. He is awake alert, in no distress, able to communicate meaningfully.  May consider Fentanyl transdermal patch for better pain management.     Length of Stay: 7 days  Current Medications: Scheduled Meds:  . antiseptic oral rinse  7 mL Mouth Rinse QID  . chlorhexidine  15 mL Mouth Rinse BID  . dolutegravir  50 mg Oral Daily  . emtricitabine-tenofovir  1 tablet Per Tube Q48H  . folic acid  1 mg Intravenous Daily  . insulin aspart  0-15 Units Subcutaneous 6 times per day  . lactulose  30 g Oral BID  . levofloxacin (LEVAQUIN) IV  750 mg Intravenous Q24H  . metronidazole  250 mg Intravenous Q8H  . pantoprazole (PROTONIX) IV  40 mg Intravenous Q12H  . thiamine IV  100 mg Intravenous Q24H    Continuous Infusions: . sodium chloride 10 mL/hr at 08/19/14 0800  . sodium chloride 1,000 mL (08/18/14 0921)  . norepinephrine (LEVOPHED) Adult infusion Stopped (08/17/14 1115)    PRN Meds: albuterol, fentaNYL (SUBLIMAZE) injection, midazolam, [DISCONTINUED] ondansetron **OR** ondansetron (ZOFRAN) IV  Palliative Performance Scale: 40%     Vital Signs: BP 118/79 mmHg  Pulse 113  Temp(Src) 96.8 F (36  C) (Core (Comment))  Resp 25  Ht 5\' 9"  (1.753 m)  Wt 63 kg (138 lb 14.2 oz)  BMI 20.50 kg/m2  SpO2 94% SpO2: SpO2: 94 % O2 Device: O2 Device: Nasal Cannula O2 Flow Rate: O2 Flow Rate (L/min): 2 L/min  Intake/output summary:   Intake/Output Summary (Last 24 hours) at 08/19/14 0929 Last data filed at 08/19/14 0831  Gross per 24 hour  Intake 1085.62 ml  Output    550 ml  Net 535.62 ml   LBM:   Baseline Weight: Weight: 54.1 kg (119 lb 4.3 oz) Most recent weight: Weight: 63 kg (138 lb 14.2 oz)  Physical Exam:  Frail NAD Poor dentition, edentulous Neck with IJ  chest clear anterior Abdomen firm mild to moderate distention Patient is awake alert conversing  Additional Data Reviewed: Recent Labs     08/18/14  0447  08/19/14  0400  WBC  12.2*  13.5*  HGB  10.1*  10.7*  PLT  108*  115*  NA  151*  147*  BUN  52*  44*  CREATININE  1.39*  1.34*     Problem List:  Patient Active Problem List   Diagnosis Date Noted  . Abdominal pain, generalized   . Encounter for palliative care   . Leukocytosis 08/13/2014  . Anemia of chronic disease 08/13/2014  . ARF (acute renal failure) 08/13/2014  . HTN (hypertension), benign 08/13/2014  . Hepatitis C 08/13/2014  . Protein-calorie malnutrition, severe 08/13/2014  . Acute posthemorrhagic anemia   . Endotracheal tube present   .  Hematemesis with nausea   . Upper GI bleed 08/12/2014  . Liver mass   . Alcohol consumption heavy 07/23/2013  . Human immunodeficiency virus (HIV) disease 11/03/2005  . Acute hepatitis C virus infection 11/03/2005     Palliative Care Assessment & Plan    Code Status:  DNR  Goals of Care:   comfort measures with some gentle treatments, patient with continued stabilization since self extubation.   Case management consult, consider SNF rehab. PT consult.   Desire for further Chaplaincy support:no  3. Symptom Management:   fentanyl and versed IV PRN  4. Palliative Prophylaxis:  Stool  Softener: patient currently with diarrhea  5. Prognosis: ?weeks to months  5. Discharge Planning: ? SNF for rehab attempt. Consider palliative care at SNF if patient declines at [facility]   Care plan was discussed with patient, puml CCM NP  Thank you for allowing the Palliative Medicine Team to assist in the care of this patient.   Time In: 0900 Time Out: 0925 Total Time 25 min Prolonged Time Billed  no     Greater than 50%  of this time was spent counseling and coordinating care related to the above assessment and plan.   Loistine Chance, MD  08/19/2014, 9:29 AM  Please contact Palliative Medicine Team phone at [phone] for questions and concerns.  [phone]

## 2014-08-19 NOTE — Clinical Social Work Note (Signed)
Clinical Social Work Assessment  Patient Details  Name: Logan Combs MRN: [id number] Date of Birth: 09/07/1950  Date of referral:  08/19/14               Reason for consult:  Discharge Planning, Facility Placement                Permission sought to share information with:    Permission granted to share information::     Name::        Agency::     Relationship::     Contact Information:     Housing/Transportation Living arrangements for the past 2 months:  Single Family Home Source of Information:  Patient Patient Interpreter Needed:  None Criminal Activity/Legal Involvement Pertinent to Current Situation/Hospitalization:  No - Comment as needed Significant Relationships:  Siblings Lives with:  Self Do you feel safe going back to the place where you live?  Yes Need for family participation in patient care:  Yes (Comment)  Care giving concerns:  Pt may not be able to manage his care at time of hospital d/c.   Social Worker assessment / plan:  Pt hospitalized on 08/12/14 with upper GI bleeding. He experienced acute respiratory failure and was intubated. Pt extubated himself on 08/17/14. Pt has multiple medical concerns including a hx of ETOH abuse, Hep C and HIV. Palliative Care Team has been consulted and determined that, at this time, pt is not appropriate for residential hospice home placement. PT /OT have been ordered. SNF placement will most likely be needed. CSW consulted to assist with d/c planning. CSW met with pt this am. Pt appeared tired and anxious. Pt is concerned about his plans to sell his home and move closer to his family. Pt reports that he has a sister ( Phyllis ) and brother ( Joe ). Pt reports he is starting to feel a little better. Pt reports he is willing to consider rehab placement following hospital d/c. Message left for FC to see if pt applied for medicaid during his hospitalization earlier this month. CSW will continue to follow to assist with d/c  planning.  Employment status:  Full-Time Insurance information:  Self Pay (Medicaid Pending) PT Recommendations:  Not assessed at this time Information / Referral to community resources:  Skilled Nursing Facility  Patient/Family's Response to care:  Pt reports feeling weak. He agrees that rehab may be helpful.   Patient/Family's Understanding of and Emotional Response to Diagnosis, Current Treatment, and Prognosis:  No family at bedside. Palliative Care notes indicate that siblings are aware of pt's medical status. Pt is anxious about his health / plans. Support provided.  Emotional Assessment Appearance:  Appears older than stated age Attitude/Demeanor/Rapport:  Other (cooperative) Affect (typically observed):  Anxious Orientation:  Oriented to Self, Oriented to Place, Oriented to  Time, Oriented to Situation Alcohol / Substance use:  Alcohol Use Psych involvement (Current and /or in the community):  No (Comment)  Discharge Needs  Concerns to be addressed:  Substance Abuse Concerns, Financial / Insurance Concerns, Discharge Planning Concerns Readmission within the last 30 days:  Yes Current discharge risk:  Substance Abuse, Lives alone, Inadequate Financial Supports Barriers to Discharge:  Active Substance Use, Inadequate or no insurance   Haidinger, Jamie Lee, LCSW  [phone] 08/19/2014, 12:59 PM  

## 2014-08-19 NOTE — Progress Notes (Signed)
CSW met with pt / sister this afternoon to assist with d/c planning. Pt has no health insurance. FC will see if pt qualify's for medicaid . Pt's sister will assist with medicaid application process. Pt / family agree that SNF placement will be needed at d/c. Pt does not have the funds to pay out of pocket for placement. [facility] will provide LOG. PT eval is pending.   CSW will continue to follow to assist with d/c planning.  Werner Lean LCSW [phone]

## 2014-08-19 NOTE — Progress Notes (Signed)
INFECTIOUS DISEASE PROGRESS NOTE  ID: Logan Combs is a 64 y.o. male with  Principal Problem:   Upper GI bleed Active Problems:   Human immunodeficiency virus (HIV) disease   Alcohol consumption heavy   Liver mass   Leukocytosis   Anemia of chronic disease   ARF (acute renal failure)   HTN (hypertension), benign   Hepatitis C   Protein-calorie malnutrition, severe   Acute posthemorrhagic anemia   Endotracheal tube present   Hematemesis with nausea   Abdominal pain, generalized   Encounter for palliative care   Muscular deconditioning   HIV disease  Subjective: " I feel better"  Abtx:  Anti-infectives    Start     Dose/Rate Route Frequency Ordered Stop   08/17/14 1800  levofloxacin (LEVAQUIN) IVPB 750 mg     750 mg 100 mL/hr over 90 Minutes Intravenous Every 24 hours 08/17/14 1103     08/15/14 1300  emtricitabine-tenofovir (TRUVADA) 200-300 MG per tablet 1 tablet     1 tablet Per Tube Every 48 hours 08/13/14 2225     08/14/14 1700  levofloxacin (LEVAQUIN) IVPB 750 mg  Status:  Discontinued     750 mg 100 mL/hr over 90 Minutes Intravenous Every 48 hours 08/14/14 1630 08/17/14 1103   08/14/14 1600  metroNIDAZOLE (FLAGYL) IVPB 250 mg     250 mg 50 mL/hr over 60 Minutes Intravenous Every 8 hours 08/14/14 1553     08/13/14 1330  ciprofloxacin (CIPRO) IVPB 400 mg  Status:  Discontinued     400 mg 200 mL/hr over 60 Minutes Intravenous  Once 08/13/14 1321 08/13/14 1500   08/13/14 1315  ciprofloxacin (CIPRO) IVPB 400 mg  Status:  Discontinued     400 mg 200 mL/hr over 60 Minutes Intravenous Every 12 hours 08/13/14 1313 08/13/14 1321   08/13/14 1300  dolutegravir (TIVICAY) tablet 50 mg     50 mg Oral Daily 08/13/14 1152     08/13/14 1300  emtricitabine-tenofovir (TRUVADA) 200-300 MG per tablet 1 tablet  Status:  Discontinued     1 tablet Oral Every 48 hours 08/13/14 1158 08/13/14 2225   08/13/14 1200  emtricitabine-tenofovir (TRUVADA) 200-300 MG per tablet 1 tablet   Status:  Discontinued     1 tablet Oral Daily 08/13/14 1152 08/13/14 1157      Medications:  Scheduled: . dolutegravir  50 mg Oral Daily  . emtricitabine-tenofovir  1 tablet Per Tube Q48H  . fentaNYL  25 mcg Transdermal Q72H  . folic acid  1 mg Intravenous Daily  . insulin aspart  0-15 Units Subcutaneous 6 times per day  . lactulose  30 g Oral BID  . levofloxacin (LEVAQUIN) IV  750 mg Intravenous Q24H  . metronidazole  250 mg Intravenous Q8H  . pantoprazole (PROTONIX) IV  40 mg Intravenous Q12H  . thiamine IV  100 mg Intravenous Q24H    Objective: Vital signs in last 24 hours: Temp:  [96.3 F (35.7 C)-97.3 F (36.3 C)] 97.3 F (36.3 C) (07/18 1200) Resp:  [11-25] 11 (07/18 1400) BP: (99-155)/(60-89) 110/60 mmHg (07/18 1400) SpO2:  [91 %-100 %] 96 % (07/18 1400)   General appearance: alert, cooperative, fatigued and no distress Resp: clear to auscultation bilaterally Cardio: regular rate and rhythm GI: normal findings: bowel sounds normal and soft, non-tender and abnormal findings:  distended Extremities: edema none  Lab Results  Recent Labs  08/18/14 0447 08/19/14 0400  WBC 12.2* 13.5*  HGB 10.1* 10.7*  HCT 29.8* 32.3*  NA 151* 147*  K 3.5 3.2*  CL 123* 123*  CO2 21* 19*  BUN 52* 44*  CREATININE 1.39* 1.34*   Liver Panel  Recent Labs  08/17/14 0356 08/19/14 0400  PROT 6.0* 6.3*  ALBUMIN 2.2* 2.0*  AST 272* 123*  ALT 206* 128*  ALKPHOS 201* 180*  BILITOT 2.3* 2.4*   Sedimentation Rate No results for input(s): ESRSEDRATE in the last 72 hours. C-Reactive Protein No results for input(s): CRP in the last 72 hours.  Microbiology: Recent Results (from the past 240 hour(s))  MRSA PCR Screening     Status: None   Collection Time: 08/12/14  9:36 PM  Result Value Ref Range Status   MRSA by PCR NEGATIVE NEGATIVE Final    Comment:        The GeneXpert MRSA Assay (FDA approved for NASAL specimens only), is one component of a comprehensive MRSA  colonization surveillance program. It is not intended to diagnose MRSA infection nor to guide or monitor treatment for MRSA infections.     Studies/Results: No results found.   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  Will need to repeat after d/c or after he has been on meds consistently for >1 week.        [name] Infectious Diseases (pager) [phone] [email or web address] 08/19/2014, 3:34 PM  LOS: 7 days

## 2014-08-20 DIAGNOSIS — D72829 Elevated white blood cell count, unspecified: Secondary | ICD-10-CM

## 2014-08-20 DIAGNOSIS — R197 Diarrhea, unspecified: Secondary | ICD-10-CM

## 2014-08-20 DIAGNOSIS — B370 Candidal stomatitis: Secondary | ICD-10-CM

## 2014-08-20 LAB — CBC
HCT: 33.1 % — ABNORMAL LOW (ref 39.0–52.0)
HEMOGLOBIN: 10.6 g/dL — AB (ref 13.0–17.0)
MCH: 29.9 pg (ref 26.0–34.0)
MCHC: 32 g/dL (ref 30.0–36.0)
MCV: 93.2 fL (ref 78.0–100.0)
Platelets: 122 10*3/uL — ABNORMAL LOW (ref 150–400)
RBC: 3.55 MIL/uL — AB (ref 4.22–5.81)
RDW: 21.3 % — AB (ref 11.5–15.5)
WBC: 17.5 10*3/uL — AB (ref 4.0–10.5)

## 2014-08-20 LAB — GLUCOSE, CAPILLARY
Glucose-Capillary: 111 mg/dL — ABNORMAL HIGH (ref 65–99)
Glucose-Capillary: 114 mg/dL — ABNORMAL HIGH (ref 65–99)
Glucose-Capillary: 159 mg/dL — ABNORMAL HIGH (ref 65–99)
Glucose-Capillary: 103 mg/dL — ABNORMAL HIGH (ref 65–99)
Glucose-Capillary: 138 mg/dL — ABNORMAL HIGH (ref 65–99)
Glucose-Capillary: 143 mg/dL — ABNORMAL HIGH (ref 65–99)

## 2014-08-20 LAB — BASIC METABOLIC PANEL
Anion gap: 4 — ABNORMAL LOW (ref 5–15)
BUN: 41 mg/dL — ABNORMAL HIGH (ref 6–20)
CALCIUM: 8.5 mg/dL — AB (ref 8.9–10.3)
CO2: 20 mmol/L — ABNORMAL LOW (ref 22–32)
Chloride: 128 mmol/L — ABNORMAL HIGH (ref 101–111)
Creatinine, Ser: 1.52 mg/dL — ABNORMAL HIGH (ref 0.61–1.24)
GFR calc Af Amer: 55 mL/min — ABNORMAL LOW (ref >60)
GFR, EST NON AFRICAN AMERICAN: 47 mL/min — AB (ref >60)
GLUCOSE: 125 mg/dL — AB (ref 65–99)
Potassium: 3.8 mmol/L (ref 3.5–5.1)
Sodium: 152 mmol/L — ABNORMAL HIGH (ref 135–145)

## 2014-08-20 LAB — HEPATIC FUNCTION PANEL
ALK PHOS: 184 U/L — AB (ref 38–126)
ALT: 93 U/L — AB (ref 17–63)
AST: 96 U/L — AB (ref 15–41)
Albumin: 2 g/dL — ABNORMAL LOW (ref 3.5–5.0)
BILIRUBIN DIRECT: 1.2 mg/dL — AB (ref 0.1–0.5)
Indirect Bilirubin: 1.4 mg/dL — ABNORMAL HIGH (ref 0.3–0.9)
Total Bilirubin: 2.6 mg/dL — ABNORMAL HIGH (ref 0.3–1.2)
Total Protein: 6.4 g/dL — ABNORMAL LOW (ref 6.5–8.1)

## 2014-08-20 LAB — PROTIME-INR
INR: 1.53 — AB (ref 0.00–1.49)
Prothrombin Time: 18.4 seconds — ABNORMAL HIGH (ref 11.6–15.2)

## 2014-08-20 MED ORDER — FLUCONAZOLE 100 MG PO TABS
100.0000 mg | ORAL_TABLET | Freq: Every day | ORAL | Status: DC
Start: 2014-08-21 — End: 2014-08-23
  Administered 2014-08-21 – 2014-08-23 (×3): 100 mg via ORAL
  Filled 2014-08-20 (×5): qty 1

## 2014-08-20 MED ORDER — INSULIN ASPART 100 UNIT/ML ~~LOC~~ SOLN
0.0000 [IU] | Freq: Three times a day (TID) | SUBCUTANEOUS | Status: DC
  Administered 2014-08-20: 2 [IU] via SUBCUTANEOUS
  Administered 2014-08-20: 3 [IU] via SUBCUTANEOUS
  Administered 2014-08-21: 2 [IU] via SUBCUTANEOUS

## 2014-08-20 MED ORDER — OXYCODONE HCL 5 MG PO TABS
5.0000 mg | ORAL_TABLET | ORAL | Status: DC | PRN
  Administered 2014-08-20: 5 mg via ORAL
  Filled 2014-08-20 (×2): qty 1

## 2014-08-20 MED ORDER — INSULIN ASPART 100 UNIT/ML ~~LOC~~ SOLN: 0.0000 [IU] | Freq: Every day | SUBCUTANEOUS | Status: DC

## 2014-08-20 MED ORDER — CARVEDILOL 3.125 MG PO TABS
3.1250 mg | ORAL_TABLET | Freq: Two times a day (BID) | ORAL | Status: DC
  Administered 2014-08-21 (×2): 3.125 mg via ORAL
  Filled 2014-08-20 (×2): qty 1

## 2014-08-20 MED ORDER — DEXTROSE 5 % IV SOLN
INTRAVENOUS | Status: DC
  Administered 2014-08-20 – 2014-08-23 (×5): via INTRAVENOUS

## 2014-08-20 MED ORDER — LEVOFLOXACIN IN D5W 750 MG/150ML IV SOLN
750.0000 mg | INTRAVENOUS | Status: DC
  Administered 2014-08-21: 750 mg via INTRAVENOUS
  Filled 2014-08-20 (×2): qty 150

## 2014-08-20 MED ORDER — LACTULOSE 10 GM/15ML PO SOLN
30.0000 g | Freq: Every day | ORAL | Status: DC
  Administered 2014-08-20: 30 g via ORAL
  Filled 2014-08-20 (×3): qty 45

## 2014-08-20 MED ORDER — FLUCONAZOLE 100 MG PO TABS
200.0000 mg | ORAL_TABLET | Freq: Once | ORAL | Status: AC
  Administered 2014-08-20: 200 mg via ORAL
  Filled 2014-08-20: qty 2

## 2014-08-20 MED ORDER — FERROUS SULFATE 325 (65 FE) MG PO TABS
325.0000 mg | ORAL_TABLET | Freq: Every day | ORAL | Status: DC
  Administered 2014-08-21 – 2014-08-23 (×3): 325 mg via ORAL
  Filled 2014-08-20 (×4): qty 1

## 2014-08-20 NOTE — Progress Notes (Signed)
PT Cancellation Note  Patient Details Name: Logan Combs MRN: [id number] DOB: October 23, 1950   Cancelled Treatment:    Reason Eval/Treat Not Completed: Fatigue/lethargy limiting ability to participate (pt doesn't feel up to getting up today. Will follow. )   Philomena Doheny 08/20/2014, 2:58 PM [phone]

## 2014-08-20 NOTE — Progress Notes (Signed)
OT Cancellation Note  Patient Details Name: ELMAR ANTIGUA MRN: [id number] DOB: October 06, 1950   Cancelled Treatment:    Reason Eval/Treat Not Completed: Fatigue/lethargy limiting ability to participate .  Pt did not feel up to OT this afternoon.  Will check back tomorrow.  Averill Pons 08/20/2014, 2:40 PM  Lesle Chris, OTR/L [phone] 08/20/2014

## 2014-08-20 NOTE — Progress Notes (Signed)
Daily Progress Note   Patient Name: Logan Combs       Date: 08/20/2014 DOB: Jul 12, 1950  Age: 64 y.o. MRN#: [id number] Attending Physician: Janece Canterbury, MD Primary Care Physician: [name], MD Admit Date: 08/12/2014 Life limiting illness: Sequela of chronic liver disease, recent ventilator dependent respiratory failure, GI bleed,  NSTEMI secondary to demand ischemia, acute renal failure Reason for Consultation/Follow-up: Establishing goals of care  Subjective:  resting in bed, awake alert. In no distress this am. Has been started on Fentanyl patch, states pain is reasonably well controlled this am.   PLAN:   PT consult   SNF rehab. Patient with gradual stabilization over the past few days.  Discussed with patient, Dr Sheran Fava and bedside RN It does not appear that the patient is an inpatient hospice candidate this am. He is awake alert, in no distress, able to communicate meaningfully.  continue Fentanyl transdermal patch for better pain management.  To transfer out of the ICU today.     Length of Stay: 8 days  Current Medications: Scheduled Meds:  . dolutegravir  50 mg Oral Daily  . emtricitabine-tenofovir  1 tablet Per Tube Q48H  . feeding supplement  1 Container Oral TID BM  . fentaNYL  25 mcg Transdermal Q72H  . folic acid  1 mg Intravenous Daily  . insulin aspart  0-15 Units Subcutaneous TID WC  . insulin aspart  0-5 Units Subcutaneous QHS  . lactulose  30 g Oral Daily  . levofloxacin (LEVAQUIN) IV  750 mg Intravenous Q24H  . metronidazole  250 mg Intravenous Q8H  . pantoprazole (PROTONIX) IV  40 mg Intravenous Q12H  . thiamine IV  100 mg Intravenous Q24H    Continuous Infusions: . dextrose      PRN Meds: albuterol, fentaNYL (SUBLIMAZE) injection, [DISCONTINUED] ondansetron **OR** ondansetron (ZOFRAN) IV  Palliative Performance Scale: 40%     Vital Signs: BP 129/81 mmHg  Pulse 113  Temp(Src) 96.8 F (36 C) (Core (Comment))  Resp 15  Ht 5\' 9"   (1.753 m)  Wt 63 kg (138 lb 14.2 oz)  BMI 20.50 kg/m2  SpO2 92% SpO2: SpO2: 92 % O2 Device: O2 Device: Not Delivered O2 Flow Rate: O2 Flow Rate (L/min): 2 L/min  Intake/output summary:   Intake/Output Summary (Last 24 hours) at 08/20/14 0810 Last data filed at 08/20/14 0600  Gross per 24 hour  Intake    755 ml  Output    475 ml  Net    280 ml   LBM:   Baseline Weight: Weight: 54.1 kg (119 lb 4.3 oz) Most recent weight: Weight: 63 kg (138 lb 14.2 oz)  Physical Exam:  Frail NAD Poor dentition, edentulous   chest clear anterior Abdomen firm mild distention Patient is awake alert conversing  Additional Data Reviewed: Recent Labs     08/19/14  0400  08/20/14  0400  WBC  13.5*  17.5*  HGB  10.7*  10.6*  PLT  115*  122*  NA  147*  152*  BUN  44*  41*  CREATININE  1.34*  1.52*     Problem List:  Patient Active Problem List   Diagnosis Date Noted  . Muscular deconditioning   . HIV disease   . Abdominal pain, generalized   . Encounter for palliative care   . Leukocytosis 08/13/2014  . Anemia of chronic disease 08/13/2014  . ARF (acute renal failure) 08/13/2014  . HTN (hypertension), benign 08/13/2014  . Hepatitis C 08/13/2014  .  Protein-calorie malnutrition, severe 08/13/2014  . Acute posthemorrhagic anemia   . Endotracheal tube present   . Hematemesis with nausea   . Upper GI bleed 08/12/2014  . Liver mass   . Alcohol consumption heavy 07/23/2013  . Human immunodeficiency virus (HIV) disease 11/03/2005  . Acute hepatitis C virus infection 11/03/2005     Palliative Care Assessment & Plan    Code Status:  DNR  Goals of Care:   comfort measures with some gentle treatments, patient with continued stabilization since self extubation several days ago.    Case management consult, consider SNF rehab. PT consult.   Desire for further Chaplaincy support:no  3. Symptom Management:   fentanyl and versed IV PRN  4. Palliative Prophylaxis:  Stool  Softener: patient currently with diarrhea  5. Prognosis: ?weeks to months  5. Discharge Planning: ? SNF for rehab attempt. Consider palliative care at SNF if patient declines at [facility] lives in [place], [place] and would like for the patient to be closer to her. She had preliminarily approached [facility] when patient was in the ICU on the ventilator. Continue current mode of care and D/C to SNF for rehab. But if the patient declines, the plan is to focus on comfort care and seek hospice at that time.   Care plan was discussed with patient, Dr Sheran Fava and bedside RN  Thank you for allowing the Palliative Medicine Team to assist in the care of this patient.   Time In: 0730 Time Out: 0755 Total Time 25 min Prolonged Time Billed  no     Greater than 50%  of this time was spent counseling and coordinating care related to the above assessment and plan.   Loistine Chance, MD  08/20/2014, 8:10 AM  Please contact Palliative Medicine Team phone at [phone] for questions and concerns.  [phone]

## 2014-08-20 NOTE — Progress Notes (Signed)
INFECTIOUS DISEASE PROGRESS NOTE  ID: Logan Combs is a 64 y.o. male with  Principal Problem:   Upper GI bleed Active Problems:   Human immunodeficiency virus (HIV) disease   Alcohol consumption heavy   Liver mass   Leukocytosis   Anemia of chronic disease   ARF (acute renal failure)   HTN (hypertension), benign   Hepatitis C   Protein-calorie malnutrition, severe   Acute posthemorrhagic anemia   Endotracheal tube present   Hematemesis with nausea   Abdominal pain, generalized   Encounter for palliative care   Muscular deconditioning   HIV disease  Subjective: Eating poorly, having loose BM (on lactulose)  Abtx:  Anti-infectives    Start     Dose/Rate Route Frequency Ordered Stop   08/21/14 1800  levofloxacin (LEVAQUIN) IVPB 750 mg     750 mg 100 mL/hr over 90 Minutes Intravenous Every 48 hours 08/20/14 0818     08/21/14 1000  fluconazole (DIFLUCAN) tablet 100 mg     100 mg Oral Daily 08/20/14 0813     08/20/14 1000  fluconazole (DIFLUCAN) tablet 200 mg     200 mg Oral  Once 08/20/14 0813 08/20/14 1009   08/17/14 1800  levofloxacin (LEVAQUIN) IVPB 750 mg  Status:  Discontinued     750 mg 100 mL/hr over 90 Minutes Intravenous Every 24 hours 08/17/14 1103 08/20/14 0818   08/15/14 1300  emtricitabine-tenofovir (TRUVADA) 200-300 MG per tablet 1 tablet     1 tablet Per Tube Every 48 hours 08/13/14 2225     08/14/14 1700  levofloxacin (LEVAQUIN) IVPB 750 mg  Status:  Discontinued     750 mg 100 mL/hr over 90 Minutes Intravenous Every 48 hours 08/14/14 1630 08/17/14 1103   08/14/14 1600  metroNIDAZOLE (FLAGYL) IVPB 250 mg     250 mg 50 mL/hr over 60 Minutes Intravenous Every 8 hours 08/14/14 1553     08/13/14 1330  ciprofloxacin (CIPRO) IVPB 400 mg  Status:  Discontinued     400 mg 200 mL/hr over 60 Minutes Intravenous  Once 08/13/14 1321 08/13/14 1500   08/13/14 1315  ciprofloxacin (CIPRO) IVPB 400 mg  Status:  Discontinued     400 mg 200 mL/hr over 60 Minutes  Intravenous Every 12 hours 08/13/14 1313 08/13/14 1321   08/13/14 1300  dolutegravir (TIVICAY) tablet 50 mg     50 mg Oral Daily 08/13/14 1152     08/13/14 1300  emtricitabine-tenofovir (TRUVADA) 200-300 MG per tablet 1 tablet  Status:  Discontinued     1 tablet Oral Every 48 hours 08/13/14 1158 08/13/14 2225   08/13/14 1200  emtricitabine-tenofovir (TRUVADA) 200-300 MG per tablet 1 tablet  Status:  Discontinued     1 tablet Oral Daily 08/13/14 1152 08/13/14 1157      Medications:  Scheduled: . dolutegravir  50 mg Oral Daily  . emtricitabine-tenofovir  1 tablet Per Tube Q48H  . feeding supplement  1 Container Oral TID BM  . fentaNYL  25 mcg Transdermal Q72H  . [START ON 08/21/2014] fluconazole  100 mg Oral Daily  . folic acid  1 mg Intravenous Daily  . insulin aspart  0-15 Units Subcutaneous TID WC  . insulin aspart  0-5 Units Subcutaneous QHS  . lactulose  30 g Oral Daily  . [START ON 08/21/2014] levofloxacin (LEVAQUIN) IV  750 mg Intravenous Q48H  . metronidazole  250 mg Intravenous Q8H  . pantoprazole (PROTONIX) IV  40 mg Intravenous Q12H  . thiamine IV  100 mg Intravenous Q24H    Objective: Vital signs in last 24 hours: Temp:  [96.8 F (36 C)-97.5 F (36.4 C)] 96.8 F (36 C) (07/19 0800) Resp:  [11-22] 13 (07/19 1000) BP: (110-141)/(60-90) 117/77 mmHg (07/19 1000) SpO2:  [91 %-100 %] 93 % (07/19 1000)   General appearance: alert, cooperative, fatigued and no distress Neck: dressing on R at prev line site.  Resp: diminished breath sounds bilaterally Cardio: regular rate and rhythm GI: normal findings: soft, non-tender and abnormal findings:  distended and hypoactive bowel sounds  Lab Results  Recent Labs  08/19/14 0400 08/20/14 0400  WBC 13.5* 17.5*  HGB 10.7* 10.6*  HCT 32.3* 33.1*  NA 147* 152*  K 3.2* 3.8  CL 123* 128*  CO2 19* 20*  BUN 44* 41*  CREATININE 1.34* 1.52*   Liver Panel  Recent Labs  08/19/14 0400 08/20/14 0400  PROT 6.3* 6.4*    ALBUMIN 2.0* 2.0*  AST 123* 96*  ALT 128* 93*  ALKPHOS 180* 184*  BILITOT 2.4* 2.6*  BILIDIR  --  1.2*  IBILI  --  1.4*   Sedimentation Rate No results for input(s): ESRSEDRATE in the last 72 hours. C-Reactive Protein No results for input(s): CRP in the last 72 hours.  Microbiology: Recent Results (from the past 240 hour(s))  MRSA PCR Screening     Status: None   Collection Time: 08/12/14  9:36 PM  Result Value Ref Range Status   MRSA by PCR NEGATIVE NEGATIVE Final    Comment:        The GeneXpert MRSA Assay (FDA approved for NASAL specimens only), is one component of a comprehensive MRSA colonization surveillance program. It is not intended to diagnose MRSA infection nor to guide or monitor treatment for MRSA infections.     Studies/Results: No results found.   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 Assume diarrhea is due to lactulose unless he has fever or WBC stays up.  Start to think about stopping flagyl/levaquin ( ~ day 7), f/u CXR Will continue to watch        Logan Combs Infectious Diseases (pager) [phone] [email or web address] 08/20/2014, 12:08 PM  LOS: 8 days

## 2014-08-20 NOTE — Clinical Social Work Placement (Signed)
   CLINICAL SOCIAL WORK PLACEMENT  NOTE  Date:  08/20/2014  Patient Details  Name: Logan Combs MRN: [id number] Date of Birth: 07-03-1950  Clinical Social Work is seeking post-discharge placement for this patient at the [place] level of care (*CSW will initial, date and re-position this form in  chart as items are completed):  No   Patient/family provided with [facility] Work Department's list of facilities offering this level of care within the geographic area requested by the patient (or if unable, by the patient's family).  Yes   Patient/family informed of their freedom to choose among providers that offer the needed level of care, that participate in Medicare, Medicaid or managed care program needed by the patient, have an available bed and are willing to accept the patient.  Yes   Patient/family informed of [facility]'s ownership interest in [facility] and [facility], as well as of the fact that they are under no obligation to receive care at these facilities.  PASRR submitted to EDS on 08/19/14     PASRR number received on 08/19/14     Existing PASRR number confirmed on       FL2 transmitted to all facilities in geographic area requested by pt/family on 08/20/14     FL2 transmitted to all facilities within larger geographic area on       Patient informed that his/her managed care company has contracts with or will negotiate with certain facilities, including the following:            Patient/family informed of bed offers received.  Patient chooses bed at       Physician recommends and patient chooses bed at      Patient to be transferred to   on  .  Patient to be transferred to facility by       Patient family notified on   of transfer.  Name of family member notified:        PHYSICIAN       Additional Comment:    _______________________________________________ Luretha Rued, LCSW 08/20/2014, 9:37 AM

## 2014-08-20 NOTE — Progress Notes (Signed)
Date:  August 20, 2014 U.R. performed for needs and level of care. Will continue to follow for Case Management needs.  Velva Harman, RN, BSN, [place]   [phone]

## 2014-08-20 NOTE — Progress Notes (Addendum)
[facility] PROGRESS NOTE  TULLIO CHAUSSE JJO:[id number] DOB: [birth year] DOA: 08/12/2014 PCP: [name], MD  Brief Summary  64 y/o male with hx of HIV, Hep C, ETOH with cirrhosis/ascites who was recently admitted for CAP, portal vein thrombosis and hepatocellular carcinoma, who presented with hematemesis and melena from variceal bleed.  He presented in hemorrhagic shock requiring intubation and emergent EGD.  Hospital course complicated NSTEMI from demand ischemia. Self extubated on 7/16 at which time he was made DNR/DNI by his family.    STUDIES:  7/11 CT c spine >> no acute injury; multilevel degenerative disc disease 7/12 EGD >> banding of varices 7/13 ECHO >> EF 45 to 16%, grade 1 diastolic dysfx  SIGNIFICANT EVENTS: 7/5-7/8 admission for CAP, cirrhosis, portal vein thrombosis 7/11 Re admit, Kcentra, GI consult >> EGD and VDRF; hemorrhagic shock with NSTEMI from demand ischemia 7/13 Goals of care d/w family >> continue medical therapy, DNR if arrests 7/16 Self extubated, weaned off vasopressors, Some abd pain / melena overnight.   Assessment/Plan  Active problems:  Upper GI bleeding due to esophageal varices s/p clipping on 7/11.  He was intubated for airway protection and required vasopressors, transfusion of 4 units PRBC and 2 units FFP secondary to hemorrhagic shock/acute blood loss anemia.  On 7/16, he was weaned off vasopressors and he self-extubated.  His lactic acidosis resolved.  He is now hemodynamically stable and on RA.   -  Start iron supplementation -  hgb stable -  Advancing diet slowly  AKI, possibly due to ATN from hypotension, creatinine rising slightly.  Non anion gap metabolic acidosis due to AKI and hyperchloremia is resolving.  -  Increase IVF and monitor for swelling -  Repeat creatinine in AM  Hypernatremia due to poor oral intake  -  Change to D5W  -  Repeat BMP in AM  Fever 7/13 (Tm 101), likely intraabdominal source. Rising  leukocytosis is likely due to stress dose steroids.  Developing thrush.   -  Today is last day of levofloxacin and flagyl -  Start fluconazole -  Trend WBC -  Appreciate ID assistance  Acute metabolic & hepatic encephalopathy, resolving -  Decrease lactulose due to diarrhea -  If mentation becomes problematic with ongoing diarrhea, consider rifaximin   Less active problems:  Cirrhosis with ascites, coagulopathy, and liver mass consistent with hepatocellular carcinoma due to ETOH, Hep C, and HIV -  INR stable -  Holding diuretics  -  Start low dose BB -  F/u with hepatology  EtOH abuse, stable, continue thiamine and folate.  No current signs of withdrawal  HIV and Hep C. -  antiretroviral medicines per ID  Portal vein thrombosis -  No A/C due to hemorrhage  Acute systolic heart failure from NSTEMI type 2 (demand ischemia) and acute illness.   -  No ASA due to recent bleeding  -  Start low dose carvedilol  -  Continue to hold diuretics -  Outpatient cardiology f/u  Shock liver, resolving -  LFTs almost back to normal  Sick euthyroid, repeat TFTs in 4 weeks  Hypokalemia, resolved with supplementation  Mild hyperglycemia -  Change to meal time SSI  Severe protein calorie malnutrition. -  Regular diet with supplements -  Appreciate nutrition assistance  Diet:  regular Access:  PIV IVF:  yes Proph:  SCDs  Code Status: DNR Family Communication: patient alone Disposition Plan: pending resolution of hypernatremia, diet advanced, likely to SNF with palliative care to follow.  PT/OT consulted, but patient refused to work with them today.   Consultants:  Gastroenterology, Dr. Paulita Fujita  Palliative care, Dr. Rowe Pavy  PCCM  Infectious disease, Dr. Johnnye Sima  Antibiotics:  Levofloxacin and flagyl, currently day 7  Tivicay (dolutegravir) and Truvada (emtricitabine-tenofovir) 7/16  Fluconazole 7/19 >  HPI/Subjective:  Patient states that he feels well.  Denies  nausea, vomiting.  He has been having diarrhea (6 times yesterday) but not bloody.  Denies pains.  Feels weak.    Objective: Filed Vitals:   08/20/14 0600 08/20/14 0800 08/20/14 1000 08/20/14 1230  BP: 129/81 141/89 117/77 117/80  Pulse:    102  Temp: 96.8 F (36 C) 96.8 F (36 C)  97.5 F (36.4 C)  TempSrc:    Oral  Resp: 15 14 13 16   Height:      Weight:      SpO2: 92% 94% 93% 94%    Intake/Output Summary (Last 24 hours) at 08/20/14 1512 Last data filed at 08/20/14 1504  Gross per 24 hour  Intake 929.17 ml  Output    415 ml  Net 514.17 ml   Filed Weights   08/12/14 2138 08/13/14 1458 08/15/14 0500  Weight: 54.1 kg (119 lb 4.3 oz) 60.8 kg (134 lb 0.6 oz) 63 kg (138 lb 14.2 oz)   Body mass index is 20.5 kg/(m^2).  Exam:   General:  Cachectic male, no acute distress  HEENT:  NCAT, MMM, cordis still in right neck  Cardiovascular:  RRR, nl S1, S2 no mrg, 2+ pulses, warm extremities  Respiratory:  CTAB, no increased WOB  Abdomen:   NABS, soft, moderately distended, nontender  MSK:   Normal tone and bulk, no LEE  Neuro:  Diffusely weak.    Data Reviewed: Basic Metabolic Panel:  Recent Labs Lab 08/14/14 0415  08/16/14 0336 08/17/14 0356 08/18/14 0447 08/19/14 0400 08/20/14 0400  NA 139  < > 143 148* 151* 147* 152*  K 3.8  < > 3.3* 3.0* 3.5 3.2* 3.8  CL 115*  < > 113* 118* 123* 123* 128*  CO2 18*  < > 23 21* 21* 19* 20*  GLUCOSE 144*  < > 149* 126* 118* 109* 125*  BUN 51*  < > 43* 45* 52* 44* 41*  CREATININE 1.85*  < > 1.38* 1.29* 1.39* 1.34* 1.52*  CALCIUM 6.9*  < > 7.5* 8.0* 8.2* 8.4* 8.5*  MG 1.9  --  1.9 2.0  --  2.0  --   PHOS 3.9  --   --   --   --  2.9  --   < > = values in this interval not displayed. Liver Function Tests:  Recent Labs Lab 08/15/14 0500 08/16/14 0336 08/17/14 0356 08/19/14 0400 08/20/14 0400  AST 980* 579* 272* 123* 96*  ALT 468* 322* 206* 128* 93*  ALKPHOS 299* 260* 201* 180* 184*  BILITOT 2.7* 3.4* 2.3* 2.4* 2.6*   PROT 5.2* 5.7* 6.0* 6.3* 6.4*  ALBUMIN 1.9* 2.0* 2.2* 2.0* 2.0*    Recent Labs Lab 08/15/14 0500  LIPASE 25  AMYLASE 29   No results for input(s): AMMONIA in the last 168 hours. CBC:  Recent Labs Lab 08/16/14 0336 08/17/14 0356 08/18/14 0447 08/19/14 0400 08/20/14 0400  WBC 15.2* 13.3* 12.2* 13.5* 17.5*  NEUTROABS  --   --   --  11.1*  --   HGB 10.5* 10.1* 10.1* 10.7* 10.6*  HCT 29.6* 29.8* 29.8* 32.3* 33.1*  MCV 85.1 85.9 91.1 89.7 93.2  PLT 107*  107* 108* 115* 122*    Recent Results (from the past 240 hour(s))  MRSA PCR Screening     Status: None   Collection Time: 08/12/14  9:36 PM  Result Value Ref Range Status   MRSA by PCR NEGATIVE NEGATIVE Final    Comment:        The GeneXpert MRSA Assay (FDA approved for NASAL specimens only), is one component of a comprehensive MRSA colonization surveillance program. It is not intended to diagnose MRSA infection nor to guide or monitor treatment for MRSA infections.      Studies: No results found.  Scheduled Meds: . dolutegravir  50 mg Oral Daily  . emtricitabine-tenofovir  1 tablet Per Tube Q48H  . feeding supplement  1 Container Oral TID BM  . fentaNYL  25 mcg Transdermal Q72H  . [START ON 08/21/2014] fluconazole  100 mg Oral Daily  . folic acid  1 mg Intravenous Daily  . insulin aspart  0-15 Units Subcutaneous TID WC  . insulin aspart  0-5 Units Subcutaneous QHS  . lactulose  30 g Oral Daily  . [START ON 08/21/2014] levofloxacin (LEVAQUIN) IV  750 mg Intravenous Q48H  . metronidazole  250 mg Intravenous Q8H  . pantoprazole (PROTONIX) IV  40 mg Intravenous Q12H  . thiamine IV  100 mg Intravenous Q24H   Continuous Infusions: . dextrose 50 mL/hr at 08/20/14 1011    Principal Problem:   Upper GI bleed Active Problems:   Human immunodeficiency virus (HIV) disease   Alcohol consumption heavy   Liver mass   Leukocytosis   Anemia of chronic disease   ARF (acute renal failure)   HTN (hypertension),  benign   Hepatitis C   Protein-calorie malnutrition, severe   Acute posthemorrhagic anemia   Endotracheal tube present   Hematemesis with nausea   Abdominal pain, generalized   Encounter for palliative care   Muscular deconditioning   HIV disease    Time spent: 45 min    Nicolina Hirt, [facility] Pager [phone]. If 7PM-7AM, please contact night-coverage at [email or web address], password [facility] 08/20/2014, 3:12 PM  LOS: 8 days

## 2014-08-20 NOTE — Progress Notes (Signed)
ANTIBIOTIC CONSULT NOTE - FOLLOW UP  Pharmacy Consult for Levaquin Indication: Intra-abdominal infection  Allergies  Allergen Reactions  . Penicillins Anaphylaxis  . Codeine Hives and Itching    Patient Measurements: Height: 5\' 9"  (175.3 cm) Weight: 138 lb 14.2 oz (63 kg) IBW/kg (Calculated) : 70.7  Vital Signs: Temp: 96.8 F (36 C) (07/19 0600) Temp Source: Core (Comment) (07/19 0400) BP: 129/81 mmHg (07/19 0600) Intake/Output from previous day: 07/18 0701 - 07/19 0700 In: 505 [P.O.:240; I.V.:235; IV Piggyback:300] Out: 550 [Urine:550]  Labs:  Recent Labs  08/18/14 0447 08/19/14 0400 08/20/14 0400  WBC 12.2* 13.5* 17.5*  HGB 10.1* 10.7* 10.6*  PLT 108* 115* 122*  CREATININE 1.39* 1.34* 1.52*   Estimated Creatinine Clearance: 44.3 mL/min (by C-G formula based on Cr of 1.52).   Assessment: 63yoM admitted 7/11 with hematemesis/melena requiring reversal of warfarin with Vit K and KCentra.  H had a recent admission to [facility] with SOB, and noted to have a liver mass c/w hepatocellular carcinoma, then discharged on warfarin for portal vein thrombosis.  PMH also includes HIV, HCV, ETOh abuse, liver failure and ascites.  Pharmacy was consulted to dose Levaquin for suspected intra-abdominal infection, metronidazole dosing per MD.  7/13 >> Levaquin >> 7/13 >> Flagyl >>  7/19 >> Fluconazole >>  Today, 08/20/2014:  Day #7 Levaquin, Flagyl  Afebrile  WBC increased, 17.5  SCr increased, 1.52 with CrCl ~ 44 ml/min   Goal of Therapy:  Appropriate abx dosing, eradication of infection.   Plan:   Decrease Levaquin to 750mg  IV q48h  Follow up renal fxn, culture results, clinical course, and duration of therapy.  Gretta Arab PharmD, BCPS Pager [phone] 08/20/2014 8:06 AM

## 2014-08-21 ENCOUNTER — Ambulatory Visit: Payer: 59 | Admitting: Internal Medicine

## 2014-08-21 ENCOUNTER — Inpatient Hospital Stay (HOSPITAL_COMMUNITY): Payer: 59

## 2014-08-21 LAB — CBC
HCT: 36.5 % — ABNORMAL LOW (ref 39.0–52.0)
Hemoglobin: 11.4 g/dL — ABNORMAL LOW (ref 13.0–17.0)
MCH: 30.1 pg (ref 26.0–34.0)
MCHC: 31.2 g/dL (ref 30.0–36.0)
MCV: 96.3 fL (ref 78.0–100.0)
PLATELETS: 128 10*3/uL — AB (ref 150–400)
RBC: 3.79 MIL/uL — ABNORMAL LOW (ref 4.22–5.81)
RDW: 22.1 % — ABNORMAL HIGH (ref 11.5–15.5)
WBC: 19.2 10*3/uL — ABNORMAL HIGH (ref 4.0–10.5)

## 2014-08-21 LAB — COMPREHENSIVE METABOLIC PANEL
ALK PHOS: 185 U/L — AB (ref 38–126)
ALT: 75 U/L — ABNORMAL HIGH (ref 17–63)
AST: 79 U/L — ABNORMAL HIGH (ref 15–41)
Albumin: 2 g/dL — ABNORMAL LOW (ref 3.5–5.0)
Anion gap: 7 (ref 5–15)
BUN: 44 mg/dL — AB (ref 6–20)
CHLORIDE: 127 mmol/L — AB (ref 101–111)
CO2: 20 mmol/L — ABNORMAL LOW (ref 22–32)
Calcium: 8.7 mg/dL — ABNORMAL LOW (ref 8.9–10.3)
Creatinine, Ser: 1.7 mg/dL — ABNORMAL HIGH (ref 0.61–1.24)
GFR calc Af Amer: 48 mL/min — ABNORMAL LOW (ref >60)
GFR, EST NON AFRICAN AMERICAN: 41 mL/min — AB (ref >60)
GLUCOSE: 149 mg/dL — AB (ref 65–99)
POTASSIUM: 3.5 mmol/L (ref 3.5–5.1)
Sodium: 154 mmol/L — ABNORMAL HIGH (ref 135–145)
Total Bilirubin: 2.5 mg/dL — ABNORMAL HIGH (ref 0.3–1.2)
Total Protein: 6.3 g/dL — ABNORMAL LOW (ref 6.5–8.1)

## 2014-08-21 LAB — GLUCOSE, CAPILLARY
GLUCOSE-CAPILLARY: 94 mg/dL (ref 65–99)
Glucose-Capillary: 128 mg/dL — ABNORMAL HIGH (ref 65–99)
Glucose-Capillary: 98 mg/dL (ref 65–99)
Glucose-Capillary: 91 mg/dL (ref 65–99)

## 2014-08-21 LAB — MAGNESIUM: MAGNESIUM: 2.1 mg/dL (ref 1.7–2.4)

## 2014-08-21 MED ORDER — ALBUMIN HUMAN 25 % IV SOLN
75.0000 g | Freq: Once | INTRAVENOUS | Status: AC
  Administered 2014-08-21: 75 g via INTRAVENOUS
  Filled 2014-08-21: qty 300

## 2014-08-21 MED ORDER — METRONIDAZOLE 250 MG PO TABS
250.0000 mg | ORAL_TABLET | Freq: Three times a day (TID) | ORAL | Status: DC
Start: 2014-08-21 — End: 2014-08-23
  Administered 2014-08-21 – 2014-08-23 (×6): 250 mg via ORAL
  Filled 2014-08-21 (×8): qty 1

## 2014-08-21 NOTE — Progress Notes (Signed)
PT Cancellation Note  Patient Details Name: Logan Combs MRN: [id number] DOB: 1950/09/22   Cancelled Treatment:    Reason Eval/Treat Not Completed: Patient at procedure or test/unavailable (paracentesis.)   Claretha Cooper 08/21/2014, 2:24 PM Tresa Endo PT [phone]

## 2014-08-21 NOTE — Procedures (Signed)
Successful US guided paracentesis from RLQ.  Yielded 4.8 liters of serous fluid.  No immediate complications.  Pt tolerated well.   Specimen was not sent for labs.  Tsosie Billing D PA-C 08/21/2014 2:50 PM

## 2014-08-21 NOTE — Progress Notes (Signed)
SLP Cancellation Note  Patient Details Name: Logan Combs MRN: [id number] DOB: Jun 05, 1950   Cancelled treatment:       Reason Eval/Treat Not Completed: Other (comment) (pt npo for paracentesis, reported swallow to be normal upon SlP last visit, SLP to sign off, please reorder if desire)  Luanna Salk, [facility] SLP [phone]

## 2014-08-21 NOTE — Progress Notes (Signed)
OT Cancellation Note  Patient Details Name: Logan Combs MRN: [id number] DOB: 05/30/1950   Cancelled Treatment:    Reason Eval/Treat Not Completed: Other (comment).  Per RN, pt is getting ready for paracentesis.  Will likely check back tomorrow.  Crispin Vogel 08/21/2014, 1:49 PM  Lesle Chris, OTR/L [phone] 08/21/2014

## 2014-08-21 NOTE — Care Management Note (Signed)
Case Management Note  Patient Details  Name: Logan Combs MRN: [id number] Date of Birth: 1950-02-19  Subjective/Objective:   Transfer from SDU.IV abx, IVF.                 Action/Plan:d/c plan SNF.   Expected Discharge Date:   (unknown)               Expected Discharge Plan:  Skilled Nursing Facility  In-House Referral:  Clinical Social Work  Discharge planning Services  CM Consult  Post Acute Care Choice:  NA Choice offered to:  NA  DME Arranged:  N/A DME Agency:  NA  HH Arranged:  NA HH Agency:  NA  Status of Service:  In process, will continue to follow  Medicare Important Message Given:    Date Medicare IM Given:    Medicare IM give by:    Date Additional Medicare IM Given:    Additional Medicare Important Message give by:     If discussed at [place] of Stay Meetings, dates discussed:    Additional Comments:  Dessa Phi, RN 08/21/2014, 4:07 PM

## 2014-08-21 NOTE — Progress Notes (Signed)
Subjective: Abdomen more distended; for paracentesis today. No hematemesis or further GI bleeding.  Objective: Vital signs in last 24 hours: Temp:  [97.5 F (36.4 C)-97.7 F (36.5 C)] 97.5 F (36.4 C) (07/20 0411) Pulse Rate:  [84-96] 84 (07/20 0411) Resp:  [14-16] 14 (07/20 0411) BP: (108-140)/(77-89) 108/77 mmHg (07/20 0411) SpO2:  [98 %-99 %] 99 % (07/20 0411) Weight change:  Last BM Date: 08/20/14  PE: GEN:  Cachectic and chronically frail-appearing. SKIN: Scattered ecchymoses and telangiectasias ABD:  Moderately tense ascites EXT:  2+ pitting edema bilateral lower extremities.  Lab Results: CBC    Component Value Date/Time   WBC 19.2* 08/21/2014 0355   RBC 3.79* 08/21/2014 0355   HGB 11.4* 08/21/2014 0355   HCT 36.5* 08/21/2014 0355   PLT 128* 08/21/2014 0355   MCV 96.3 08/21/2014 0355   MCH 30.1 08/21/2014 0355   MCHC 31.2 08/21/2014 0355   RDW 22.1* 08/21/2014 0355   LYMPHSABS 1.2 08/19/2014 0400   MONOABS 1.1* 08/19/2014 0400   EOSABS 0.1 08/19/2014 0400   BASOSABS 0.0 08/19/2014 0400   CMP     Component Value Date/Time   NA 154* 08/21/2014 0355   K 3.5 08/21/2014 0355   CL 127* 08/21/2014 0355   CO2 20* 08/21/2014 0355   GLUCOSE 149* 08/21/2014 0355   BUN 44* 08/21/2014 0355   CREATININE 1.70* 08/21/2014 0355   CREATININE 0.94 03/12/2014 1118   CALCIUM 8.7* 08/21/2014 0355   PROT 6.3* 08/21/2014 0355   ALBUMIN 2.0* 08/21/2014 0355   AST 79* 08/21/2014 0355   ALT 75* 08/21/2014 0355   ALKPHOS 185* 08/21/2014 0355   BILITOT 2.5* 08/21/2014 0355   GFRNONAA 41* 08/21/2014 0355   GFRNONAA 76 07/09/2013 1441   GFRAA 48* 08/21/2014 0355   GFRAA 88 07/09/2013 1441   Assessment:  1.  Cirrhosis.  HCV and alcohol etiologies. 2.  Esophageal variceal bleeding; resolved. 3.  Liver lesion.  Suspected hepatoma. 4.  Ascites.  Plan:  1.  10-day course of antibiotics for GI bleed in setting of ascites should suffice. 2.  Don't feel comfortable adding  diuretics at this point given patient's marginal renal status. 3.  Paracentesis today. 4.  Will ultimately need outpatient follow-up with [facility] for management of cirrhosis, ascites (need TIPS?), presumed hepatoma. 5.  Hopefully home from GI perspective within the next couple days. 6.  Will follow.   Landry Dyke 08/21/2014, 1:51 PM   Pager [phone] If no answer or after 5 PM call [phone]

## 2014-08-21 NOTE — Progress Notes (Signed)
INFECTIOUS DISEASE PROGRESS NOTE  ID: Logan Combs is a 64 y.o. male with  Principal Problem:   Upper GI bleed Active Problems:   Human immunodeficiency virus (HIV) disease   Alcohol consumption heavy   Liver mass   Leukocytosis   Anemia of chronic disease   ARF (acute renal failure)   HTN (hypertension), benign   Hepatitis C   Protein-calorie malnutrition, severe   Acute posthemorrhagic anemia   Endotracheal tube present   Hematemesis with nausea   Abdominal pain, generalized   Encounter for palliative care   Muscular deconditioning   HIV disease   Thrush  Subjective: Feels better post paracentesis.   Abtx:  Anti-infectives    Start     Dose/Rate Route Frequency Ordered Stop   08/21/14 1800  levofloxacin (LEVAQUIN) IVPB 750 mg     750 mg 100 mL/hr over 90 Minutes Intravenous Every 48 hours 08/20/14 0818     08/21/14 1000  fluconazole (DIFLUCAN) tablet 100 mg     100 mg Oral Daily 08/20/14 0813     08/20/14 1000  fluconazole (DIFLUCAN) tablet 200 mg     200 mg Oral  Once 08/20/14 0813 08/20/14 1009   08/17/14 1800  levofloxacin (LEVAQUIN) IVPB 750 mg  Status:  Discontinued     750 mg 100 mL/hr over 90 Minutes Intravenous Every 24 hours 08/17/14 1103 08/20/14 0818   08/15/14 1300  emtricitabine-tenofovir (TRUVADA) 200-300 MG per tablet 1 tablet     1 tablet Per Tube Every 48 hours 08/13/14 2225     08/14/14 1700  levofloxacin (LEVAQUIN) IVPB 750 mg  Status:  Discontinued     750 mg 100 mL/hr over 90 Minutes Intravenous Every 48 hours 08/14/14 1630 08/17/14 1103   08/14/14 1600  metroNIDAZOLE (FLAGYL) IVPB 250 mg     250 mg 50 mL/hr over 60 Minutes Intravenous Every 8 hours 08/14/14 1553     08/13/14 1330  ciprofloxacin (CIPRO) IVPB 400 mg  Status:  Discontinued     400 mg 200 mL/hr over 60 Minutes Intravenous  Once 08/13/14 1321 08/13/14 1500   08/13/14 1315  ciprofloxacin (CIPRO) IVPB 400 mg  Status:  Discontinued     400 mg 200 mL/hr over 60 Minutes  Intravenous Every 12 hours 08/13/14 1313 08/13/14 1321   08/13/14 1300  dolutegravir (TIVICAY) tablet 50 mg     50 mg Oral Daily 08/13/14 1152     08/13/14 1300  emtricitabine-tenofovir (TRUVADA) 200-300 MG per tablet 1 tablet  Status:  Discontinued     1 tablet Oral Every 48 hours 08/13/14 1158 08/13/14 2225   08/13/14 1200  emtricitabine-tenofovir (TRUVADA) 200-300 MG per tablet 1 tablet  Status:  Discontinued     1 tablet Oral Daily 08/13/14 1152 08/13/14 1157      Medications:  Scheduled: . carvedilol  3.125 mg Oral BID WC  . dolutegravir  50 mg Oral Daily  . emtricitabine-tenofovir  1 tablet Per Tube Q48H  . feeding supplement  1 Container Oral TID BM  . fentaNYL  25 mcg Transdermal Q72H  . ferrous sulfate  325 mg Oral Q supper  . fluconazole  100 mg Oral Daily  . folic acid  1 mg Intravenous Daily  . insulin aspart  0-15 Units Subcutaneous TID WC  . insulin aspart  0-5 Units Subcutaneous QHS  . lactulose  30 g Oral Daily  . levofloxacin (LEVAQUIN) IV  750 mg Intravenous Q48H  . metronidazole  250 mg Intravenous Q8H  .  pantoprazole (PROTONIX) IV  40 mg Intravenous Q12H  . thiamine IV  100 mg Intravenous Q24H    Objective: Vital signs in last 24 hours: Temp:  [97.4 F (36.3 C)-97.7 F (36.5 C)] 97.4 F (36.3 C) (07/20 1520) Pulse Rate:  [81-96] 81 (07/20 1520) Resp:  [14-16] 16 (07/20 1520) BP: (99-140)/(67-89) 139/77 mmHg (07/20 1520) SpO2:  [98 %-99 %] 98 % (07/20 1520)   General appearance: alert, cooperative and no distress Resp: clear to auscultation bilaterally Cardio: regular rate and rhythm GI: normal findings: bowel sounds normal and soft, non-tender and decreased distension.   Lab Results  Recent Labs  08/20/14 0400 08/21/14 0355  WBC 17.5* 19.2*  HGB 10.6* 11.4*  HCT 33.1* 36.5*  NA 152* 154*  K 3.8 3.5  CL 128* 127*  CO2 20* 20*  BUN 41* 44*  CREATININE 1.52* 1.70*   Liver Panel  Recent Labs  08/20/14 0400 08/21/14 0355  PROT 6.4*  6.3*  ALBUMIN 2.0* 2.0*  AST 96* 79*  ALT 93* 75*  ALKPHOS 184* 185*  BILITOT 2.6* 2.5*  BILIDIR 1.2*  --   IBILI 1.4*  --    Sedimentation Rate No results for input(s): ESRSEDRATE in the last 72 hours. C-Reactive Protein No results for input(s): CRP in the last 72 hours.  Microbiology: Recent Results (from the past 240 hour(s))  MRSA PCR Screening     Status: None   Collection Time: 08/12/14  9:36 PM  Result Value Ref Range Status   MRSA by PCR NEGATIVE NEGATIVE Final    Comment:        The GeneXpert MRSA Assay (FDA approved for NASAL specimens only), is one component of a comprehensive MRSA colonization surveillance program. It is not intended to diagnose MRSA infection nor to guide or monitor treatment for MRSA infections.     Studies/Results: No results found.   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 Now getting albumin/  Agree with 10 days of anbx Consider palliative care outpt f/u- [facility] Can f/u with Dr Megan Salon in [place] [facility].  Available as needed.          [name] Infectious Diseases (pager) [phone] [email or web address] 08/21/2014, 4:08 PM  LOS: 9 days

## 2014-08-21 NOTE — Progress Notes (Signed)
[facility] PROGRESS NOTE  Logan Combs ELF:[id number] DOB: 12/02/1950 DOA: 08/12/2014 PCP: [name], MD  Brief Summary  64 y/o ? hx of HIV off Atripla since 03/2014, Hep C s/p RX Interferon ~ 10 years prior, ETOH with cirrhosis/ascites who was recently admitted for CAP, portal vein thrombosis and newly diagnosed hepatocellular carcinoma-he was referred to [facility] but was not a good resection/ablation candidate Was placed on Coumadin that admission for Portal Vn Thrombosis  presented with hematemesis and melena from variceal bleed.  He presented in hemorrhagic shock requiring intubation and emergent EGD.  Hospital course complicated NSTEMI from demand ischemia. Self extubated on 7/16 at which time he was made DNR/DNI by his family.    STUDIES:  7/11 CT c spine >> no acute injury; multilevel degenerative disc disease 7/12 EGD >> banding of varices 7/13 ECHO >> EF 45 to 58%, grade 1 diastolic dysfx  SIGNIFICANT EVENTS: 7/5-7/8 admission for CAP, cirrhosis, portal vein thrombosis 7/11 Re admit, [facility], GI consult >> EGD and VDRF; hemorrhagic shock with NSTEMI from demand ischemia 7/13 Goals of care d/w family >> continue medical therapy, DNR if arrests 7/16 Self extubated, weaned off vasopressors, Some abd pain / melena overnight.   Assessment/Plan  Active problems:  Upper GI bleeding due to esophageal varices s/p clipping on 7/11.    intubated for airway protection and required vasopressors, transfusion of 4 units PRBC and 2 units FFP secondary to hemorrhagic shock/acute blood loss anemia.   On 7/16, he was weaned off vasopressors and he self-extubated.   His lactic acidosis resolved.   He is now hemodynamically stable and on RA.   -  Start iron supplementation -  hgb stable -  Advancing diet slowly  AKI, possibly due to ATN from hypotension, creatinine rising slightly.   Non anion gap metabolic acidosis due to AKI and hyperchloremia is stable but not  improving -  Increased IVF D5 50-->75 cc/hr -  Repeat creatinine in AM -  As his prognosis is likely guarded, we will consider stopping lab checks after discussion with family   Hypernatremia due to poor oral intake  -  Change to D5W  -  Repeat BMP in AM -  Poor candidate for PEG feeds and NG free water   Fever 7/13 (Tm 101), likely intraabdominal source.  Rising leukocytosis is likely due to stress dose steroids.  Developing thrush.   -  08/21/14 last day of levofloxacin and flagyl -  Start fluconazole7/19 -  Trend WBC -  Appreciate ID assistance  Acute metabolic & hepatic encephalopathy, resolving -  Decrease lactulose due to diarrhea -  If mentation becomes problematic with ongoing diarrhea, consider rifaximin    decompensated Cirrhosis with ascites, coagulopathy, and liver mass consistent with hepatocellular carcinoma due to ETOH, Hep C, and HIV -  INR stable -  Holding diuretics  -  Start low dose BB - As he is feeling more swollen today 7/20 we will ask for large volume paracentesis and restart low-dose diabetic's probably in the next 24 hours   EtOH abuse, stable, continue thiamine and folate.  No current signs of withdrawal  HIV and Hep C. -  antiretroviral medicines per ID -  Poor overall likely candidate for aggressive medical therapy   Portal vein thrombosis -  No A/C due to hemorrhage  Acute systolic heart failure from NSTEMI type 2 (demand ischemia) and acute illness.   -  No ASA due to recent bleeding  -  Start low dose carvedilol  -  Continue to hold diuretics -  Outpatient cardiology f/u  Shock liver, resolving -  LFTs almost back to normal  Sick euthyroid, repeat TFTs in 4 weeks  Hypokalemia, resolved with supplementation  Mild hyperglycemia -  Change to meal time SSI  Severe protein calorie malnutrition. -  Regular diet with supplements -  Appreciate nutrition assistance  Diet:  regular Access:  PIV IVF:  yes Proph:  SCDs  Code Status:  DNR Family Communication: patient alone Disposition Plan Likely discussio with palliative later on this admission regarding goals of care     Consultants:  Gastroenterology, Dr. Paulita Fujita  Palliative care, Dr. Rowe Pavy  PCCM  Infectious disease, Dr. Johnnye Sima  Antibiotics:  Levofloxacin and flagyl, stopped 08/21/14  Tivicay (dolutegravir) and Truvada (emtricitabine-tenofovir) 7/16  Fluconazole 7/19 >  HPI/Subjective:    feels swollen and uncomfortable  No shortness of breath  Tolerating clears only but not eating much No nausea no vomiting No chest pain   Objective: Filed Vitals:   08/20/14 1000 08/20/14 1230 08/20/14 2025 08/21/14 0411  BP: 117/77 117/80 140/89 108/77  Pulse:  102 96 84  Temp:  97.5 F (36.4 C) 97.7 F (36.5 C) 97.5 F (36.4 C)  TempSrc:  Oral Oral Oral  Resp: 13 16 16 14   Height:      Weight:      SpO2: 93% 94% 98% 99%    Intake/Output Summary (Last 24 hours) at 08/21/14 1112 Last data filed at 08/20/14 1845  Gross per 24 hour  Intake 428.34 ml  Output      0 ml  Net 428.34 ml   Filed Weights   08/12/14 2138 08/13/14 1458 08/15/14 0500  Weight: 54.1 kg (119 lb 4.3 oz) 60.8 kg (134 lb 0.6 oz) 63 kg (138 lb 14.2 oz)   Body mass index is 20.5 kg/(m^2).  Exam:   General:  Cachectic male, no acute distress  HEENT:  NCAT, MMM, cordis still in right neck  Cardiovascular:  RRR, nl S1, S2 no mrg, 2+ pulses, warm extremities  Respiratory:  CTAB, no increased WOB  Abdomen:   NABS, softympanitic  MSK:   Normal tone and bulk, no LEE  Neuro:  Diffusely weak.    Data Reviewed: Basic Metabolic Panel:  Recent Labs Lab 08/16/14 0336 08/17/14 0356 08/18/14 0447 08/19/14 0400 08/20/14 0400 08/21/14 0355  NA 143 148* 151* 147* 152* 154*  K 3.3* 3.0* 3.5 3.2* 3.8 3.5  CL 113* 118* 123* 123* 128* 127*  CO2 23 21* 21* 19* 20* 20*  GLUCOSE 149* 126* 118* 109* 125* 149*  BUN 43* 45* 52* 44* 41* 44*  CREATININE 1.38* 1.29* 1.39* 1.34*  1.52* 1.70*  CALCIUM 7.5* 8.0* 8.2* 8.4* 8.5* 8.7*  MG 1.9 2.0  --  2.0  --  2.1  PHOS  --   --   --  2.9  --   --    Liver Function Tests:  Recent Labs Lab 08/16/14 0336 08/17/14 0356 08/19/14 0400 08/20/14 0400 08/21/14 0355  AST 579* 272* 123* 96* 79*  ALT 322* 206* 128* 93* 75*  ALKPHOS 260* 201* 180* 184* 185*  BILITOT 3.4* 2.3* 2.4* 2.6* 2.5*  PROT 5.7* 6.0* 6.3* 6.4* 6.3*  ALBUMIN 2.0* 2.2* 2.0* 2.0* 2.0*    Recent Labs Lab 08/15/14 0500  LIPASE 25  AMYLASE 29   No results for input(s): AMMONIA in the last 168 hours. CBC:  Recent Labs Lab 08/17/14 0356 08/18/14 0447 08/19/14 0400 08/20/14 0400 08/21/14 0355  WBC  13.3* 12.2* 13.5* 17.5* 19.2*  NEUTROABS  --   --  11.1*  --   --   HGB 10.1* 10.1* 10.7* 10.6* 11.4*  HCT 29.8* 29.8* 32.3* 33.1* 36.5*  MCV 85.9 91.1 89.7 93.2 96.3  PLT 107* 108* 115* 122* 128*    Recent Results (from the past 240 hour(s))  MRSA PCR Screening     Status: None   Collection Time: 08/12/14  9:36 PM  Result Value Ref Range Status   MRSA by PCR NEGATIVE NEGATIVE Final    Comment:        The GeneXpert MRSA Assay (FDA approved for NASAL specimens only), is one component of a comprehensive MRSA colonization surveillance program. It is not intended to diagnose MRSA infection nor to guide or monitor treatment for MRSA infections.      Studies: No results found.  Scheduled Meds: . carvedilol  3.125 mg Oral BID WC  . dolutegravir  50 mg Oral Daily  . emtricitabine-tenofovir  1 tablet Per Tube Q48H  . feeding supplement  1 Container Oral TID BM  . fentaNYL  25 mcg Transdermal Q72H  . ferrous sulfate  325 mg Oral Q supper  . fluconazole  100 mg Oral Daily  . folic acid  1 mg Intravenous Daily  . insulin aspart  0-15 Units Subcutaneous TID WC  . insulin aspart  0-5 Units Subcutaneous QHS  . lactulose  30 g Oral Daily  . levofloxacin (LEVAQUIN) IV  750 mg Intravenous Q48H  . metronidazole  250 mg Intravenous Q8H  .  pantoprazole (PROTONIX) IV  40 mg Intravenous Q12H  . thiamine IV  100 mg Intravenous Q24H   Continuous Infusions: . dextrose 50 mL/hr at 08/20/14 1011    Principal Problem:   Upper GI bleed Active Problems:   Human immunodeficiency virus (HIV) disease   Alcohol consumption heavy   Liver mass   Leukocytosis   Anemia of chronic disease   ARF (acute renal failure)   HTN (hypertension), benign   Hepatitis C   Protein-calorie malnutrition, severe   Acute posthemorrhagic anemia   Endotracheal tube present   Hematemesis with nausea   Abdominal pain, generalized   Encounter for palliative care   Muscular deconditioning   HIV disease   Thrush    Time 30  Verneita Griffes, MD [facility] (P) [phone]

## 2014-08-22 LAB — CBC WITH DIFFERENTIAL/PLATELET
BASOS PCT: 0 % (ref 0–1)
Basophils Absolute: 0 10*3/uL (ref 0.0–0.1)
EOS ABS: 0.2 10*3/uL (ref 0.0–0.7)
Eosinophils Relative: 2 % (ref 0–5)
HCT: 27.3 % — ABNORMAL LOW (ref 39.0–52.0)
HEMOGLOBIN: 8.7 g/dL — AB (ref 13.0–17.0)
LYMPHS PCT: 13 % (ref 12–46)
Lymphs Abs: 1.4 10*3/uL (ref 0.7–4.0)
MCH: 29.9 pg (ref 26.0–34.0)
MCHC: 31.9 g/dL (ref 30.0–36.0)
MCV: 93.8 fL (ref 78.0–100.0)
Monocytes Absolute: 0.9 10*3/uL (ref 0.1–1.0)
Monocytes Relative: 8 % (ref 3–12)
NEUTROS PCT: 77 % (ref 43–77)
Neutro Abs: 8.7 10*3/uL — ABNORMAL HIGH (ref 1.7–7.7)
Platelets: 104 10*3/uL — ABNORMAL LOW (ref 150–400)
RBC: 2.91 MIL/uL — ABNORMAL LOW (ref 4.22–5.81)
RDW: 21.8 % — AB (ref 11.5–15.5)
WBC: 11.3 10*3/uL — AB (ref 4.0–10.5)

## 2014-08-22 LAB — COMPREHENSIVE METABOLIC PANEL
ALBUMIN: 2.5 g/dL — AB (ref 3.5–5.0)
ALT: 42 U/L (ref 17–63)
AST: 56 U/L — AB (ref 15–41)
Alkaline Phosphatase: 122 U/L (ref 38–126)
Anion gap: 4 — ABNORMAL LOW (ref 5–15)
BILIRUBIN TOTAL: 2 mg/dL — AB (ref 0.3–1.2)
BUN: 42 mg/dL — ABNORMAL HIGH (ref 6–20)
CO2: 21 mmol/L — ABNORMAL LOW (ref 22–32)
CREATININE: 1.68 mg/dL — AB (ref 0.61–1.24)
Calcium: 8.8 mg/dL — ABNORMAL LOW (ref 8.9–10.3)
Chloride: 126 mmol/L — ABNORMAL HIGH (ref 101–111)
GFR calc Af Amer: 48 mL/min — ABNORMAL LOW (ref >60)
GFR calc non Af Amer: 42 mL/min — ABNORMAL LOW (ref >60)
GLUCOSE: 125 mg/dL — AB (ref 65–99)
Potassium: 3.6 mmol/L (ref 3.5–5.1)
SODIUM: 151 mmol/L — AB (ref 135–145)
Total Protein: 5.2 g/dL — ABNORMAL LOW (ref 6.5–8.1)

## 2014-08-22 LAB — GLUCOSE, CAPILLARY
GLUCOSE-CAPILLARY: 118 mg/dL — AB (ref 65–99)
Glucose-Capillary: 101 mg/dL — ABNORMAL HIGH (ref 65–99)
Glucose-Capillary: 103 mg/dL — ABNORMAL HIGH (ref 65–99)
Glucose-Capillary: 100 mg/dL — ABNORMAL HIGH (ref 65–99)

## 2014-08-22 LAB — MAGNESIUM: Magnesium: 2 mg/dL (ref 1.7–2.4)

## 2014-08-22 MED ORDER — BOOST / RESOURCE BREEZE PO LIQD: 1.0000 | Freq: Three times a day (TID) | ORAL | Status: DC

## 2014-08-22 MED ORDER — SODIUM CHLORIDE 0.9 % IV BOLUS (SEPSIS)
500.0000 mL | Freq: Once | INTRAVENOUS | Status: AC
  Administered 2014-08-22: 500 mL via INTRAVENOUS

## 2014-08-22 MED ORDER — ENSURE ENLIVE PO LIQD
237.0000 mL | Freq: Two times a day (BID) | ORAL | Status: DC
  Administered 2014-08-22: 237 mL via ORAL

## 2014-08-22 MED ORDER — ALBUMIN HUMAN 25 % IV SOLN
50.0000 g | Freq: Once | INTRAVENOUS | Status: AC
  Administered 2014-08-22: 50 g via INTRAVENOUS
  Filled 2014-08-22: qty 200

## 2014-08-22 MED ORDER — OXYCODONE HCL 5 MG PO TABS
5.0000 mg | ORAL_TABLET | Freq: Four times a day (QID) | ORAL | Status: DC | PRN
  Administered 2014-08-22: 5 mg via ORAL
  Filled 2014-08-22: qty 1

## 2014-08-22 NOTE — Progress Notes (Signed)
Notified NP on call of low blood pressure via text page.  No return call or new orders.  When attempt to contact am MD.

## 2014-08-22 NOTE — Evaluation (Signed)
Physical Therapy Evaluation Patient Details Name: DALLON DACOSTA MRN: [id number] DOB: 1950-08-24 Today's Date: 08/22/2014   History of Present Illness  64 yo male admitted with UGIB, hemorrhagic shock, VDRF, acute MI. Intubated 7/12. Extubated 7/16.   Clinical Impression  On eval, pt was Mod assist +2 for mobility. Briefly stood at EOB but pt was unable to take any steps without LEs buckling. BP 70s/40s while sitting EOB-pt denied dizziness. Recommend SNF for continued rehab.     Follow Up Recommendations SNF;Supervision/Assistance - 24 hour    Equipment Recommendations  None recommended by PT    Recommendations for Other Services OT consult     Precautions / Restrictions Precautions Precautions: Fall Restrictions Weight Bearing Restrictions: No      Mobility  Bed Mobility Overal bed mobility: Needs Assistance Bed Mobility: Supine to Sit;Sit to Supine     Supine to sit: Min assist;+2 for physical assistance;+2 for safety/equipment;HOB elevated Sit to supine: Min assist   General bed mobility comments: Assist for trunk and LEs. Increased time.   Transfers Overall transfer level: Needs assistance   Transfers: Sit to/from Stand Sit to Stand: Mod assist;+2 physical assistance;+2 safety/equipment;From elevated surface         General transfer comment: Assist to rise, stabilize. Attempted side steps towards HOB-knees buckling-pt unable  Ambulation/Gait             General Gait Details: NT-pt unable  Stairs            Wheelchair Mobility    Modified Rankin (Stroke Patients Only)       Balance           Standing balance support: During functional activity Standing balance-Leahy Scale: Poor                               Pertinent Vitals/Pain Pain Assessment: 0-10 Pain Score: 8  Pain Location: headache-8/10, abdomen-unrated Pain Descriptors / Indicators: Aching;Sore Pain Intervention(s): Monitored during session;Repositioned     Home Living Family/patient expects to be discharged to:: [place]: Gilford Rile - 2 wheels;Cane - single point;Bedside commode;Shower seat      Prior Function                 Hand Dominance        Extremity/Trunk Assessment   Upper Extremity Assessment: Defer to OT evaluation           Lower Extremity Assessment: Generalized weakness      Cervical / Trunk Assessment: Kyphotic  Communication   Communication: No difficulties  Cognition Arousal/Alertness: Awake/alert Behavior During Therapy: WFL for tasks assessed/performed Overall Cognitive Status: Within Functional Limits for tasks assessed                      General Comments      Exercises        Assessment/Plan    PT Assessment Patient needs continued PT services  PT Diagnosis Difficulty walking;Generalized weakness;Acute pain   PT Problem List Decreased strength;Decreased activity tolerance;Decreased balance;Decreased mobility;Pain  PT Treatment Interventions DME instruction;Gait training;Therapeutic activities;Functional mobility training;Therapeutic exercise;Patient/family education;Balance training   PT Goals (Current goals can be found in the Care Plan section) Acute Rehab PT Goals Patient Stated Goal: rehab PT Goal Formulation: With patient Time For Goal Achievement: 09/05/14 Potential to Achieve Goals: Fair    Frequency  Min 3X/week   Barriers to discharge        Co-evaluation               End of Session   Activity Tolerance: Patient limited by fatigue Patient left: in bed;with call bell/phone within reach           Time: 1441-1455 PT Time Calculation (min) (ACUTE ONLY): 14 min   Charges:   PT Evaluation $Initial PT Evaluation Tier I: 1 Procedure     PT G Codes:        Weston Anna, MPT Pager: [phone]

## 2014-08-22 NOTE — Progress Notes (Signed)
[facility] PROGRESS NOTE  Logan Combs FWY:[id number] DOB: May 12, 1950 DOA: 08/12/2014 PCP: [name], MD  Brief Summary  64 y/o ? hx of HIV off Atripla since 03/2014, Hep C s/p RX Interferon ~ 10 years prior, ETOH with cirrhosis/ascites who was recently admitted for CAP, portal vein thrombosis and newly diagnosed hepatocellular carcinoma-he was referred to [facility] but was not a good resection/ablation candidate Was placed on Coumadin that admission for Portal Vn Thrombosis Patient was readmitted with hematemesis and melena from variceal bleed.  He presented in hemorrhagic shock requiring intubation and emergent EGD.  Hospital course complicated NSTEMI from demand ischemia. Self extubated on 7/16 at which time he was made DNR/DNI by his family.    STUDIES:  7/11 CT c spine >> no acute injury; multilevel degenerative disc disease 7/12 EGD >> banding of varices 7/13 ECHO >> EF 45 to 27%, grade 1 diastolic dysfx  SIGNIFICANT EVENTS: 7/5-7/8 admission for CAP, cirrhosis, portal vein thrombosis 7/11 Re admit, [facility], GI consult >> EGD and VDRF; hemorrhagic shock with NSTEMI from demand ischemia 7/13 Goals of care d/w family >> continue medical therapy, DNR if arrests 7/16 Self extubated, weaned off vasopressors, Some abd pain / melena overnight.   Assessment/Plan  Active problems:  Multifactorial adult failure to thrive -Has HIV, hepatitis C with decompensated cirrhosis and hypotension, possible hepatorenal syndrome as well as new hepatoma -Very poor candidate for any type of intervention for his cancer -I had a discussion with his sister and with the patient regarding this -I have asked Dr. Hilma Favors of palliative care to weigh back in and discuss once again goals of care to ensure that we are on the same page in terms of trajectory of his care being comfort and palliative care and eventually hospice -Greatly appreciate input  Upper GI bleeding due to esophageal varices  s/p clipping on 7/11.    intubated for airway protection and required vasopressors, transfusion of 4 units PRBC and 2 units FFP secondary to hemorrhagic shock/acute blood loss anemia.   On 7/16, he was weaned off vasopressors and he self-extubated.   His lactic acidosis resolved.   He is now hemodynamically stable and on RA.   -  Start iron supplementation -  hgb stable  AKI, possibly due to ATN from hypotension, creatinine rising slightly.   Non anion gap metabolic acidosis due to AKI and hyperchloremia is stable but not improving -  Increased IVF D5 50-->75 cc/hr -I would stop checking labs in view of poor overall likely prognosis and we will await palliative care to assist  Hypernatremia due to poor oral intake  -  Change to D5W  -  Repeat BMP in AM -  Poor candidate for PEG feeds and NG free water   Fever 7/13 (Tm 101), likely intraabdominal source.  Rising leukocytosis is likely due to stress dose steroids.  Developing thrush.   -  08/21/14 last day of levofloxacin and flagyl -  Start fluconazole 7/19 -  Trend WBC - I will discuss further with palliative care as well as with ID further treatment but I suspect we can de-escalate care  Acute metabolic & hepatic encephalopathy, resolving -  Decrease lactulose due to diarrhea -  If mentation becomes problematic with ongoing diarrhea, consider rifaximin -Patient is miserable on lactulose and does not wish to use this any longer  -We will place him on this every other day on discharge    Decompensated Cirrhosis with ascites, coagulopathy, and liver mass consistent with hepatocellular  carcinoma due to ETOH, Hep C, and HIV -  INR stable -  Holding diuretics  -  Start low dose BB -  had large volume paracentesis performed 7/20 - Hypotension complicating this therefore given out and 75 mg 7/20 and 50 mg 7/21  TOH abuse, stable, continue thiamine and folate.  No current signs of withdrawal  HIV and Hep C. -  antiretroviral medicines  per ID -  Poor overall likely candidate for aggressive medical therapy   Portal vein thrombosis -  No A/C due to hemorrhage  Acute systolic heart failure from NSTEMI type 2 (demand ischemia) and acute illness.   -  No ASA due to recent bleeding  -  Start low dose carvedilol  -  Continue to hold diuretics -  Outpatient cardiology f/u  Shock liver, resolving -  LFTs almost back to normal  Sick euthyroid, repeat TFTs in 4 weeks  Hypokalemia, resolved with supplementation  Mild hyperglycemia -  Change to meal time SSI  Severe protein calorie malnutrition. -  Regular diet with supplements -  Appreciate nutrition assistance  Diet:  regular Access:  PIV IVF:  yes Proph:  SCDs  Code Status: DNR Family Communication: patient alone Disposition Plan Likely discussio with palliative later on this admission regarding goals of care     Consultants:  Gastroenterology, Dr. Paulita Fujita  Palliative care, Dr. Rowe Pavy  PCCM  Infectious disease, Dr. Johnnye Sima  Antibiotics:  Levofloxacin and flagyl, stopped 08/21/14  Tivicay (dolutegravir) and Truvada (emtricitabine-tenofovir) 7/16  Fluconazole 7/19 >  HPI/Subjective:    feels swollen and uncomfortable  No shortness of breath  Tolerating clears only but not eating much No nausea no vomiting No chest pain   Objective: Filed Vitals:   08/22/14 0500 08/22/14 0830 08/22/14 1010 08/22/14 1413  BP: 85/54 82/53 100/68 91/53  Pulse: 73  76 72  Temp: 97.9 F (36.6 C)   97.5 F (36.4 C)  TempSrc: Oral   Oral  Resp: 16   18  Height:      Weight:      SpO2: 98%   97%    Intake/Output Summary (Last 24 hours) at 08/22/14 1529 Last data filed at 08/22/14 0700  Gross per 24 hour  Intake   1400 ml  Output    250 ml  Net   1150 ml   Filed Weights   08/12/14 2138 08/13/14 1458 08/15/14 0500  Weight: 54.1 kg (119 lb 4.3 oz) 60.8 kg (134 lb 0.6 oz) 63 kg (138 lb 14.2 oz)   Body mass index is 20.5 kg/(m^2).  Exam:   General:   Cachectic male, no acute distress  HEENT:  NCAT, MMM, cordis still in right neck  Cardiovascular:  RRR, nl S1, S2 no mrg, 2+ pulses, warm extremities  Respiratory:  CTAB, no increased WOB  Abdomen:   NABS, softympanitic  MSK:   Normal tone and bulk, no LEE  Neuro:  Diffusely weak.    Data Reviewed: Basic Metabolic Panel:  Recent Labs Lab 08/16/14 0336 08/17/14 0356 08/18/14 0447 08/19/14 0400 08/20/14 0400 08/21/14 0355 08/22/14 0430  NA 143 148* 151* 147* 152* 154* 151*  K 3.3* 3.0* 3.5 3.2* 3.8 3.5 3.6  CL 113* 118* 123* 123* 128* 127* 126*  CO2 23 21* 21* 19* 20* 20* 21*  GLUCOSE 149* 126* 118* 109* 125* 149* 125*  BUN 43* 45* 52* 44* 41* 44* 42*  CREATININE 1.38* 1.29* 1.39* 1.34* 1.52* 1.70* 1.68*  CALCIUM 7.5* 8.0* 8.2* 8.4* 8.5*  8.7* 8.8*  MG 1.9 2.0  --  2.0  --  2.1 2.0  PHOS  --   --   --  2.9  --   --   --    Liver Function Tests:  Recent Labs Lab 08/17/14 0356 08/19/14 0400 08/20/14 0400 08/21/14 0355 08/22/14 0430  AST 272* 123* 96* 79* 56*  ALT 206* 128* 93* 75* 42  ALKPHOS 201* 180* 184* 185* 122  BILITOT 2.3* 2.4* 2.6* 2.5* 2.0*  PROT 6.0* 6.3* 6.4* 6.3* 5.2*  ALBUMIN 2.2* 2.0* 2.0* 2.0* 2.5*   No results for input(s): LIPASE, AMYLASE in the last 168 hours. No results for input(s): AMMONIA in the last 168 hours. CBC:  Recent Labs Lab 08/18/14 0447 08/19/14 0400 08/20/14 0400 08/21/14 0355 08/22/14 0430  WBC 12.2* 13.5* 17.5* 19.2* 11.3*  NEUTROABS  --  11.1*  --   --  8.7*  HGB 10.1* 10.7* 10.6* 11.4* 8.7*  HCT 29.8* 32.3* 33.1* 36.5* 27.3*  MCV 91.1 89.7 93.2 96.3 93.8  PLT 108* 115* 122* 128* 104*    Recent Results (from the past 240 hour(s))  MRSA PCR Screening     Status: None   Collection Time: 08/12/14  9:36 PM  Result Value Ref Range Status   MRSA by PCR NEGATIVE NEGATIVE Final    Comment:        The GeneXpert MRSA Assay (FDA approved for NASAL specimens only), is one component of a comprehensive MRSA  colonization surveillance program. It is not intended to diagnose MRSA infection nor to guide or monitor treatment for MRSA infections.      Studies: US Paracentesis  08/21/2014   INDICATION: Cirrhosis, recurrent ascites and request for paracentesis.  EXAM: ULTRASOUND-GUIDED PARACENTESIS  COMPARISON:  Paracentesis 08/06/14.  MEDICATIONS: None.  COMPLICATIONS: None immediate  TECHNIQUE: Informed written consent was obtained from the patient after a discussion of the risks, benefits and alternatives to treatment. A timeout was performed prior to the initiation of the procedure.  Initial ultrasound scanning demonstrates a large amount of ascites within the right lower abdominal quadrant. The right lower abdomen was prepped and draped in the usual sterile fashion. 1% lidocaine was used for local anesthesia.  Under direct ultrasound guidance, a 19 gauge, 7-cm, Yueh catheter was introduced. An ultrasound image was saved for documentation purposed. The paracentesis was performed. The catheter was removed and a dressing was applied. The patient tolerated the procedure well without immediate post procedural complication.  FINDINGS: A total of approximately 4.8 liters of serous fluid was removed.  IMPRESSION: Successful ultrasound-guided paracentesis yielding 4.8 liters of peritoneal fluid.  Read By:  Tsosie Billing PA-C   Electronically Signed   By: Sandi Mariscal M.D.   On: 08/21/2014 15:05    Scheduled Meds: . dolutegravir  50 mg Oral Daily  . emtricitabine-tenofovir  1 tablet Per Tube Q48H  . feeding supplement  1 Container Oral TID BM  . feeding supplement (ENSURE ENLIVE)  237 mL Oral BID BM  . fentaNYL  25 mcg Transdermal Q72H  . ferrous sulfate  325 mg Oral Q supper  . fluconazole  100 mg Oral Daily  . folic acid  1 mg Intravenous Daily  . insulin aspart  0-15 Units Subcutaneous TID WC  . insulin aspart  0-5 Units Subcutaneous QHS  . levofloxacin (LEVAQUIN) IV  750 mg Intravenous Q48H  .  metroNIDAZOLE  250 mg Oral 3 times per day  . pantoprazole (PROTONIX) IV  40 mg Intravenous Q12H  .  thiamine IV  100 mg Intravenous Q24H   Continuous Infusions: . dextrose 75 mL/hr at 08/22/14 1005    Principal Problem:   Upper GI bleed Active Problems:   Human immunodeficiency virus (HIV) disease   Alcohol consumption heavy   Liver mass   Leukocytosis   Anemia of chronic disease   ARF (acute renal failure)   HTN (hypertension), benign   Hepatitis C   Protein-calorie malnutrition, severe   Acute posthemorrhagic anemia   Endotracheal tube present   Hematemesis with nausea   Abdominal pain, generalized   Encounter for palliative care   Muscular deconditioning   HIV disease   Thrush    Time 30  Verneita Griffes, MD [facility] (P) [phone]

## 2014-08-22 NOTE — Evaluation (Signed)
Occupational Therapy Evaluation Patient Details Name: Logan Combs MRN: [id number] DOB: [birth year] Today's Date: 08/22/2014    History of Present Illness 64 yo male admitted with UGIB, hemorrhagic shock, VDRF, acute MI. Intubated 7/12. Extubated 7/16.    Clinical Impression   Pt was admitted for the above.  At baseline, he is independent with adls. Pt currently needs A x 2 for mobility and sit to stand for adls.  Goals in acute are for min A for mobility related to adls and bed mobility in preparation for these as well as toilet transfer.  Will upgrade goals as pt progresses.      Follow Up Recommendations  SNF    Equipment Recommendations  None recommended by OT    Recommendations for Other Services       Precautions / Restrictions Precautions Precautions: Fall Restrictions Weight Bearing Restrictions: No      Mobility Bed Mobility Overal bed mobility: Needs Assistance Bed Mobility: Supine to Sit;Sit to Supine     Supine to sit: Min assist;+2 for physical assistance;+2 for safety/equipment;HOB elevated Sit to supine: Min assist   General bed mobility comments: Assist for trunk and LEs. Increased time.   Transfers Overall transfer level: Needs assistance Equipment used: None Transfers: Sit to/from Stand Sit to Stand: Mod assist;+2 physical assistance;+2 safety/equipment;From elevated surface         General transfer comment: Assist to rise, stabilize. Attempted side steps towards HOB-knees buckling-pt unable    Balance   Sitting-balance support: Bilateral upper extremity supported;Feet supported Sitting balance-Leahy Scale: Fair     Standing balance support: During functional activity Standing balance-Leahy Scale: Poor                              ADL Overall ADL's : Needs assistance/impaired     Grooming: Set up;Sitting   Upper Body Bathing: Set up;Sitting   Lower Body Bathing: Maximal assistance;+2 for physical assistance;Sit  to/from stand   Upper Body Dressing : Minimal assistance;Sitting (lines)   Lower Body Dressing: Total assistance;+2 for physical assistance;Sit to/from stand                 General ADL Comments: pt able to maintain balance sitting EOB.  Feels generally weak.  Needs A x 2 to stand (mod).  Unable to sidestep as knees buckle.       Vision     Perception     Praxis      Pertinent Vitals/Pain Pain Assessment: 0-10 Pain Score: 8  Pain Location: headache Pain Descriptors / Indicators: Aching Pain Intervention(s): Limited activity within patient's tolerance;Monitored during session;Repositioned;Patient requesting pain meds-RN notified     Hand Dominance     Extremity/Trunk Assessment Upper Extremity Assessment Upper Extremity Assessment: Overall WFL for tasks assessed (grossly 4/5)      Cervical / Trunk Assessment Cervical / Trunk Assessment: Kyphotic   Communication Communication Communication: No difficulties   Cognition Arousal/Alertness: Awake/alert Behavior During Therapy: WFL for tasks assessed/performed Overall Cognitive Status: Within Functional Limits for tasks assessed                     General Comments       Exercises       Shoulder Instructions      Home Living Family/patient expects to be discharged to:: Skilled nursing facility  Home Equipment: [place] - 2 wheels;Cane - single point;Bedside commode;Shower seat          Prior Functioning/Environment Level of Independence: Independent             OT Diagnosis: Generalized weakness   OT Problem List: Decreased strength;Decreased activity tolerance;Impaired balance (sitting and/or standing);Decreased knowledge of use of DME or AE;Pain   OT Treatment/Interventions: Self-care/ADL training;DME and/or AE instruction;Energy conservation;Balance training;Patient/family education    OT Goals(Current goals can be found in the care plan section)  Acute Rehab OT Goals Patient Stated Goal: rehab OT Goal Formulation: With patient Time For Goal Achievement: 08/29/14 Potential to Achieve Goals: Good ADL Goals Pt Will Transfer to Toilet: with mod assist;squat pivot transfer;bedside commode;with transfer board Additional ADL Goal #1: pt will go from sit to stand from elevated surface with min a and maintain for 1 minute with min A for adls Additional ADL Goal #2: pt will perform bed mobility with min A in preparation for adls/toilet transfers  OT Frequency: Min 2X/week   Barriers to D/C:            Co-evaluation PT/OT/SLP Co-Evaluation/Treatment: Yes Reason for Co-Treatment: For patient/therapist safety PT goals addressed during session: Mobility/safety with mobility OT goals addressed during session: Strengthening/ROM      End of Session Nurse Communication: Patient requests pain meds  Activity Tolerance: Patient tolerated treatment well Patient left: in bed;with call bell/phone within reach   Time: 1441-1456 OT Time Calculation (min): 15 min Charges:  OT General Charges $OT Visit: 1 Procedure OT Evaluation $Initial OT Evaluation Tier I: 1 Procedure G-Codes:    Gwendoline Judy August 31, 2014, 3:33 PM  Lesle Chris, OTR/L [phone] 08-31-14

## 2014-08-23 LAB — CBC
HCT: 27.5 % — ABNORMAL LOW (ref 39.0–52.0)
HEMOGLOBIN: 8.9 g/dL — AB (ref 13.0–17.0)
MCH: 30.9 pg (ref 26.0–34.0)
MCHC: 32.4 g/dL (ref 30.0–36.0)
MCV: 95.5 fL (ref 78.0–100.0)
Platelets: 108 10*3/uL — ABNORMAL LOW (ref 150–400)
RBC: 2.88 MIL/uL — AB (ref 4.22–5.81)
RDW: 22.2 % — ABNORMAL HIGH (ref 11.5–15.5)
WBC: 9.2 10*3/uL (ref 4.0–10.5)

## 2014-08-23 LAB — COMPREHENSIVE METABOLIC PANEL
ALT: 31 U/L (ref 17–63)
AST: 49 U/L — ABNORMAL HIGH (ref 15–41)
Albumin: 2.9 g/dL — ABNORMAL LOW (ref 3.5–5.0)
Alkaline Phosphatase: 105 U/L (ref 38–126)
Anion gap: 4 — ABNORMAL LOW (ref 5–15)
BUN: 33 mg/dL — AB (ref 6–20)
CALCIUM: 8.2 mg/dL — AB (ref 8.9–10.3)
CHLORIDE: 123 mmol/L — AB (ref 101–111)
CO2: 20 mmol/L — ABNORMAL LOW (ref 22–32)
Creatinine, Ser: 1.42 mg/dL — ABNORMAL HIGH (ref 0.61–1.24)
GFR calc Af Amer: 59 mL/min — ABNORMAL LOW (ref >60)
GFR, EST NON AFRICAN AMERICAN: 51 mL/min — AB (ref >60)
Glucose, Bld: 123 mg/dL — ABNORMAL HIGH (ref 65–99)
POTASSIUM: 3 mmol/L — AB (ref 3.5–5.1)
SODIUM: 147 mmol/L — AB (ref 135–145)
Total Bilirubin: 1.9 mg/dL — ABNORMAL HIGH (ref 0.3–1.2)
Total Protein: 5.1 g/dL — ABNORMAL LOW (ref 6.5–8.1)

## 2014-08-23 LAB — GLUCOSE, CAPILLARY
GLUCOSE-CAPILLARY: 113 mg/dL — AB (ref 65–99)
GLUCOSE-CAPILLARY: 115 mg/dL — AB (ref 65–99)
Glucose-Capillary: 107 mg/dL — ABNORMAL HIGH (ref 65–99)

## 2014-08-23 LAB — MAGNESIUM: MAGNESIUM: 1.8 mg/dL (ref 1.7–2.4)

## 2014-08-23 MED ORDER — FOLIC ACID 1 MG PO TABS: 1.0000 mg | ORAL_TABLET | Freq: Every day | ORAL | Status: DC

## 2014-08-23 MED ORDER — MIDODRINE HCL 5 MG PO TABS: 5.0000 mg | ORAL_TABLET | Freq: Two times a day (BID) | ORAL | Status: AC

## 2014-08-23 MED ORDER — FENTANYL 25 MCG/HR TD PT72: 25.0000 ug | MEDICATED_PATCH | TRANSDERMAL | Status: AC

## 2014-08-23 MED ORDER — OXYCODONE HCL 5 MG PO TABS: 5.0000 mg | ORAL_TABLET | Freq: Four times a day (QID) | ORAL | Status: AC | PRN

## 2014-08-23 MED ORDER — MIDODRINE HCL 5 MG PO TABS
5.0000 mg | ORAL_TABLET | Freq: Two times a day (BID) | ORAL | Status: DC
  Administered 2014-08-23: 5 mg via ORAL
  Filled 2014-08-23: qty 1

## 2014-08-23 MED ORDER — FLUCONAZOLE 100 MG PO TABS: 100.0000 mg | ORAL_TABLET | Freq: Every day | ORAL | Status: AC

## 2014-08-23 MED ORDER — VITAMIN B-1 100 MG PO TABS
100.0000 mg | ORAL_TABLET | Freq: Every day | ORAL | Status: DC
  Filled 2014-08-23: qty 1

## 2014-08-23 NOTE — Clinical Social Work Placement (Signed)
Patient is set to discharge to [facility] SN[facility] today. Patient & sister, Logan Combs at bedside aware. Discharge packet given to RN, Raquel Sarna. [facility] called for transport.     Raynaldo Opitz, [facility] Clinical Social Worker cell #: [phone]    CLINICAL SOCIAL WORK PLACEMENT  NOTE  Date:  08/23/2014  Patient Details  Name: Logan Combs MRN: [id number] Date of Birth: 09-Jan-1951  Clinical Social Work is seeking post-discharge placement for this patient at the [place] level of care (*CSW will initial, date and re-position this form in  chart as items are completed):  No   Patient/family provided with [place] Work Department's list of facilities offering this level of care within the geographic area requested by the patient (or if unable, by the patient's family).  Yes   Patient/family informed of their freedom to choose among providers that offer the needed level of care, that participate in Medicare, Medicaid or managed care program needed by the patient, have an available bed and are willing to accept the patient.  Yes   Patient/family informed of 's ownership interest in [facility] and [facility], as well as of the fact that they are under no obligation to receive care at these facilities.  PASRR submitted to EDS on 08/19/14     PASRR number received on 08/19/14     Existing PASRR number confirmed on       FL2 transmitted to all facilities in geographic area requested by pt/family on 08/20/14     FL2 transmitted to all facilities within larger geographic area on       Patient informed that his/her managed care company has contracts with or will negotiate with certain facilities, including the following:        Yes   Patient/family informed of bed offers received.  Patient chooses bed at Other - please specify in the comment section below: [facility])     Physician  recommends and patient chooses bed at      Patient to be transferred to Other - please specify in the comment section below: [facility]) on 08/23/14.  Patient to be transferred to facility by [facility]     Patient family notified on 08/23/14 of transfer.  Name of family member notified:  patient's sister, Logan Combs at bedside     PHYSICIAN       Additional Comment:    _______________________________________________ Standley Brooking, LCSW 08/23/2014, 4:36 PM

## 2014-08-23 NOTE — Progress Notes (Signed)
Attempted to call report to [place], no RN available to take report.

## 2014-08-23 NOTE — Progress Notes (Signed)
Subjective: Feels better after paracentesis couple days ago. No GI bleeding.  Objective: Vital signs in last 24 hours: Temp:  [97.5 F (36.4 C)-97.9 F (36.6 C)] 97.7 F (36.5 C) (07/22 0456) Pulse Rate:  [65-74] 65 (07/22 0456) Resp:  [16-18] 16 (07/22 0456) BP: (90-91)/(47-53) 90/47 mmHg (07/22 0456) SpO2:  [94 %-97 %] 94 % (07/22 0456) Weight change:  Last BM Date: 08/22/14  PE: GEN:  Chronically ill-appearing, cachectic, older-appearing than stated age  Lab Results: CBC    Component Value Date/Time   WBC 9.2 08/23/2014 0400   RBC 2.88* 08/23/2014 0400   HGB 8.9* 08/23/2014 0400   HCT 27.5* 08/23/2014 0400   PLT 108* 08/23/2014 0400   MCV 95.5 08/23/2014 0400   MCH 30.9 08/23/2014 0400   MCHC 32.4 08/23/2014 0400   RDW 22.2* 08/23/2014 0400   LYMPHSABS 1.4 08/22/2014 0430   MONOABS 0.9 08/22/2014 0430   EOSABS 0.2 08/22/2014 0430   BASOSABS 0.0 08/22/2014 0430   CMP     Component Value Date/Time   NA 147* 08/23/2014 0400   K 3.0* 08/23/2014 0400   CL 123* 08/23/2014 0400   CO2 20* 08/23/2014 0400   GLUCOSE 123* 08/23/2014 0400   BUN 33* 08/23/2014 0400   CREATININE 1.42* 08/23/2014 0400   CREATININE 0.94 03/12/2014 1118   CALCIUM 8.2* 08/23/2014 0400   PROT 5.1* 08/23/2014 0400   ALBUMIN 2.9* 08/23/2014 0400   AST 49* 08/23/2014 0400   ALT 31 08/23/2014 0400   ALKPHOS 105 08/23/2014 0400   BILITOT 1.9* 08/23/2014 0400   GFRNONAA 51* 08/23/2014 0400   GFRNONAA 76 07/09/2013 1441   GFRAA 59* 08/23/2014 0400   GFRAA 88 07/09/2013 1441   Assessment:  1. Cirrhosis. HCV and alcohol etiologies. 2. Esophageal variceal bleeding; resolved. 3. Liver lesion. Suspected hepatoma. 4. Ascites.  Plan:  1. 10-day course of antibiotics for GI bleed in setting of ascites. 2. Don't feel comfortable adding diuretics at this point given patient's marginal renal status. 4. Upon discharge, will need outpatient follow-up with [facility] for management of cirrhosis,  ascites (need TIPS?), presumed hepatoma. 5. Hopefully home from GI perspective within the next couple days. 6. Will sign-off; we will arrange outpatient follow-up with Dr. Watt Climes, patient would likely benefit from repeat endoscopy and possible further banding in the next few weeks, pending recommendations from [facility].   Logan Combs 08/23/2014, 11:53 AM   Pager [phone] If no answer or after 5 PM call [phone]

## 2014-08-23 NOTE — Progress Notes (Signed)
I met with Logan Combs and his sister this afternoon to help with goals of care. He understands how serious his illness is currently-his main goal is getting his affairs in order. He agrees to go to [facility]- to the [facility]. I strongly recommend that hospice and/or palliative care services follow him at the center- his prognosis is <6 months. He is on optimal medical managment- he will need therapeutic paracentesis as an outpatient for his comfort.  Gold DNR form should go with him.  Please call if the palliative team can be of further assistance.  Lane Hacker, DO Palliative Medicine [phone]

## 2014-08-23 NOTE — Discharge Summary (Signed)
Physician Discharge Summary  Logan Combs EXN:[id number] DOB: [birth year] DOA: 08/12/2014  PCP: [name], MD  Admit date: 08/12/2014 Discharge date: 08/23/2014  Time spent: 40 minutes  Recommendations for Outpatient Follow-up:  1. Recommend Hospcie be called in for referral once Patient reaches [place] center  2. Will need Therapeutic paracentesis for Cirrhosis + Ascites in 4-5 days as per Hospice 3. Continue Fluconazole until 7/29 and reassess  Discharge Diagnoses:  Principal Problem:   Upper GI bleed Active Problems:   Human immunodeficiency virus (HIV) disease   Alcohol consumption heavy   Liver mass   Leukocytosis   Anemia of chronic disease   ARF (acute renal failure)   HTN (hypertension), benign   Hepatitis C   Protein-calorie malnutrition, severe   Acute posthemorrhagic anemia   Endotracheal tube present   Hematemesis with nausea   Abdominal pain, generalized   Encounter for palliative care   Muscular deconditioning   HIV disease   Thrush   Discharge Condition: guarded-poor overall prognosis  Diet recommendation: comfort feeds  Filed Weights   08/12/14 2138 08/13/14 1458 08/15/14 0500  Weight: 54.1 kg (119 lb 4.3 oz) 60.8 kg (134 lb 0.6 oz) 63 kg (138 lb 14.2 oz)    History of present illness:  64 y/o ? hx of HIV off Atripla since 03/2014, Hep C s/p RX Interferon ~ 10 years prior, ETOH with cirrhosis/ascites who was recently admitted for CAP, portal vein thrombosis and newly diagnosed hepatocellular carcinoma-he was referred to [facility] but was not a good resection/ablation candidate Was placed on Coumadin that admission for Portal Vn Thrombosis Patient was readmitted with hematemesis and melena from variceal bleed. He presented in hemorrhagic shock requiring intubation and emergent EGD. Hospital course complicated NSTEMI from demand ischemia. Self extubated on 7/16 at which time he was made DNR/DNI by his family.  He has made fair progress- suring his time  outside of the ICU and has had a paracentesis which was complicated by volume depletion and Azotemia from ICU stay/diuresis as well as hypotension Because of ? Hepato-renal syndrome he was not fwelt to be a good candidate fopr Diurestics for his ascites He is also a poor candidate for advanced therapies for his Hepatic Mass at [facility] as his Palliative Performance score is about 20, indicating worst case scenario prognosis 22 days, best case 108 days As such Palliative care was consulted and goals of care were delineated He was recommended to have Hospice follow him at [place] He was given limited Rx for Fentanyl and OXY and Gold DNR was signed His prognosis overall is poor  Discharge Exam: Filed Vitals:   08/23/14 1500  BP: 99/60  Pulse:   Temp:   Resp:     General: eomi, ncat Cardiovascular: s1 sd 2no m/r/g Respiratory: clear distended abdomen  Discharge Instructions   Discharge Instructions    Diet - low sodium heart healthy    Complete by:  As directed      Increase activity slowly    Complete by:  As directed           Current Discharge Medication List    START taking these medications   Details  fentaNYL (DURAGESIC - DOSED MCG/HR) 25 MCG/HR patch Place 1 patch (25 mcg total) onto the skin every 3 (three) days. Qty: 5 patch, Refills: 0    fluconazole (DIFLUCAN) 100 MG tablet Take 1 tablet (100 mg total) by mouth daily. Qty: 30 tablet, Refills: 0    midodrine (PROAMATINE) 5 MG tablet  Take 1 tablet (5 mg total) by mouth 2 (two) times daily with a meal. Qty: 60 tablet, Refills: 0    oxyCODONE (OXY IR/ROXICODONE) 5 MG immediate release tablet Take 1 tablet (5 mg total) by mouth every 6 (six) hours as needed for moderate pain or severe pain. Qty: 30 tablet, Refills: 0      CONTINUE these medications which have NOT CHANGED   Details  dolutegravir (TIVICAY) 50 MG tablet Take 1 tablet (50 mg total) by mouth daily. Qty: 30 tablet, Refills: 11   Associated Diagnoses:  Human immunodeficiency virus (HIV) disease    emtricitabine-tenofovir (TRUVADA) 200-300 MG per tablet Take 1 tablet by mouth daily. Qty: 30 tablet, Refills: 11   Associated Diagnoses: Human immunodeficiency virus (HIV) disease    enoxaparin (LOVENOX) 60 MG/0.6ML injection Inject 0.6 mLs (60 mg total) into the skin every 12 (twelve) hours. Qty: 10 Syringe, Refills: 0    furosemide (LASIX) 20 MG tablet Take 1 tablet (20 mg total) by mouth daily. Qty: 30 tablet, Refills: 0    Omega-3 Fatty Acids (OMEGA 3 PO) Take 2 capsules by mouth daily.      STOP taking these medications     levofloxacin (LEVAQUIN) 750 MG tablet      Probiotic Product (PROBIOTIC DAILY PO)      spironolactone (ALDACTONE) 50 MG tablet      warfarin (COUMADIN) 2.5 MG tablet        Allergies  Allergen Reactions  . Penicillins Anaphylaxis  . Codeine Hives and Itching      The results of significant diagnostics from this hospitalization (including imaging, microbiology, ancillary and laboratory) are listed below for reference.    Significant Diagnostic Studies: Dg Chest 2 View  08/05/2014   CLINICAL DATA:  Progressive abdominal distension an abdominal pain, several months duration. Productive cough.  EXAM: CHEST  2 VIEW  COMPARISON:  03/12/2008.  FINDINGS: Heart size is normal. Mediastinal shadows are normal. The left lung and left chest are clear. On the right, there is volume loss/ infiltrate in the right middle lobe and a right-sided effusion. Upper lobe is clear.  IMPRESSION: Volume loss/ infiltrate in the right lower lobe.  Right effusion.   Electronically Signed   By: Nelson Chimes M.D.   On: 08/05/2014 20:45   Dg Abd 1 View  08/16/2014   CLINICAL DATA:  Gastric tube granulation  EXAM: ABDOMEN - 1 VIEW  COMPARISON:  None.  FINDINGS: The bowel gas pattern is normal. There is NG-tube with tip in distal stomach. Small calcifications in right upper quadrant may represent gallstones.  IMPRESSION: Normal small bowel  gas pattern.  NG tube with tip in distal stomach.   Electronically Signed   By: Lahoma Crocker M.D.   On: 08/16/2014 16:45   Ct Cervical Spine Wo Contrast  08/12/2014   CLINICAL DATA:  64 year old male with history of trauma from a fall this morning. Cervical spine clearance prior to ICU admission.  EXAM: CT CERVICAL SPINE WITHOUT CONTRAST  TECHNIQUE: Multidetector CT imaging of the cervical spine was performed without intravenous contrast. Multiplanar CT image reconstructions were also generated.  COMPARISON:  No priors.  FINDINGS: No acute displaced cervical spine fracture. Alignment is anatomic. Prevertebral soft tissues are normal. Multilevel degenerative disc disease, most severe at C6-C7. Mild multilevel facet arthropathy. Visualized portions of the upper thorax demonstrate extensive pleuroparenchymal thickening, most compatible with chronic post infectious or inflammatory scarring. Several small calcified granulomas are also incidentally noted.  IMPRESSION: 1. No evidence  of significant acute traumatic injury to the cervical spine. 2. Mild multilevel degenerative disc disease and cervical spondylosis, as above.   Electronically Signed   By: Vinnie Langton M.D.   On: 08/12/2014 21:20   Mr Liver W Wo Contrast  08/08/2014   CLINICAL DATA:  Liver lesion on ultrasound. History of cirrhosis and HIV infection. Initial encounter.  EXAM: MRI ABDOMEN WITHOUT AND WITH CONTRAST  TECHNIQUE: Multiplanar multisequence MR imaging of the abdomen was performed both before and after the administration of intravenous contrast.  CONTRAST:  7 ml Eovist  COMPARISON:  Ultrasound 08/06/2014.  CT 10/13/2004.  FINDINGS: Lower chest: Distal esophageal wall thickening noted with probable small distal esophageal varices. There is a small left pleural effusion and mild bibasilar atelectasis.  Hepatobiliary: There are diffuse morphologic changes of cirrhosis with contour irregularity of the liver and relative enlargement of the left and  caudate lobes. Prior to contrast, the liver is mildly heterogeneous on T2 weighted images without apparent focal mass lesion. Post-contrast, there is a subtle hypovascular lesion posteriorly in the right hepatic lobe, corresponding with the ultrasound finding. This is best seen on series 701, measuring 4.5 x 3.6 cm transverse on image 25. This lesion measures approximately 3.9 cm cephalocaudad and involves segments 6 and 7. There is no restricted diffusion within this lesion. No other suspicious focal lesions are identified. There is heterogeneous enhancement of the liver with multiple small regenerating nodules. There is occlusive thrombus within the right portal vein and nonocclusive thrombus in the main portal vein. No definite enhancement of this thrombus identified. The left portal vein and hepatic veins appear patent. Mild nonspecific gallbladder wall thickening. No significant biliary dilatation.  Pancreas: A well-circumscribed 3.5 cm oval mass involving or adjacent to the pancreatic tail is unchanged from the prior CT. This demonstrates signal and enhancement identical to the spleen and is consistent with an accessory splenule. The pancreas itself appears unremarkable without ductal dilatation.  Spleen: Mild splenomegaly without focal abnormality. Accessory splenule as above.  Adrenals/Urinary Tract: Both adrenal glands appear normal.The kidneys appear normal without evidence of urinary tract calculus or hydronephrosis. Bladder not imaged.  Stomach/Bowel: Mild diffuse bowel wall thickening is nonspecific in light of the patient's ascites. No significant distension or focal abnormality identified.  Vascular/Lymphatic: As above, there is portal vein thrombosis, primarily within the right lobe. The superior mesenteric and splenic veins are patent. No significant arterial abnormalities identified. There are small distal esophageal varices. Small lymph nodes within the retroperitoneum and porta hepatis are likely  reactive.  Other: There is a large amount of ascites. No peritoneal nodularity or suspicious enhancement demonstrated.  Musculoskeletal: No acute or significant osseous findings. Mild lumbar spondylosis and convex left scoliosis noted.  IMPRESSION: 1. Portal vein thrombosis with occlusive components in the right lobe. There is no definite enhancement of this thrombus to confirm tumor thrombus, although that is likely in this clinical context. 2. The previously identified lesion within the right hepatic lobe on ultrasound is subtle on MRI, manifesting as an area of decreased enhancement following contrast. Given the patient's cirrhosis and markedly elevated serum AFP level of 8,074, this is consistent with atypical hepatocellular carcinoma. No distant metastases identified. 3. Evidence of portal hypertension with splenomegaly, ascites and distal esophageal varices. Stable accessory splenule adjacent to the pancreatic tail.   Electronically Signed   By: Richardean Sale M.D.   On: 08/08/2014 08:44   US Paracentesis  08/21/2014   INDICATION: Cirrhosis, recurrent ascites and request for paracentesis.  EXAM: ULTRASOUND-GUIDED PARACENTESIS  COMPARISON:  Paracentesis 08/06/14.  MEDICATIONS: None.  COMPLICATIONS: None immediate  TECHNIQUE: Informed written consent was obtained from the patient after a discussion of the risks, benefits and alternatives to treatment. A timeout was performed prior to the initiation of the procedure.  Initial ultrasound scanning demonstrates a large amount of ascites within the right lower abdominal quadrant. The right lower abdomen was prepped and draped in the usual sterile fashion. 1% lidocaine was used for local anesthesia.  Under direct ultrasound guidance, a 19 gauge, 7-cm, Yueh catheter was introduced. An ultrasound image was saved for documentation purposed. The paracentesis was performed. The catheter was removed and a dressing was applied. The patient tolerated the procedure well  without immediate post procedural complication.  FINDINGS: A total of approximately 4.8 liters of serous fluid was removed.  IMPRESSION: Successful ultrasound-guided paracentesis yielding 4.8 liters of peritoneal fluid.  Read By:  Tsosie Billing PA-C   Electronically Signed   By: Sandi Mariscal M.D.   On: 08/21/2014 15:05   US Paracentesis  08/06/2014   INDICATION: Symptomatic ascites.  EXAM: ULTRASOUND-GUIDED PARACENTESIS  COMPARISON:  None.  MEDICATIONS: 10 cc 1% lidocaine  COMPLICATIONS: None immediate  TECHNIQUE: Informed written consent was obtained from the patient after a discussion of the risks, benefits and alternatives to treatment. A timeout was performed prior to the initiation of the procedure.  Initial ultrasound scanning demonstrates a large amount of ascites within the left lower abdominal quadrant. The left lower abdomen was prepped and draped in the usual sterile fashion. 1% lidocaine with epinephrine was used for local anesthesia. Under direct ultrasound guidance, a 19 gauge, 7-cm, Yueh catheter was introduced. An ultrasound image was saved for documentation purposed. The paracentesis was performed. The catheter was removed and a dressing was applied. The patient tolerated the procedure well without immediate post procedural complication.  FINDINGS: A total of approximately 3.5 liters of yellow fluid was removed. Samples were sent to the laboratory as requested by the clinical team.  IMPRESSION: Successful ultrasound-guided paracentesis yielding 3.5 liters of peritoneal fluid.  Read by:  Lavonia Drafts [facility]   Electronically Signed   By: Sandi Mariscal M.D.   On: 08/06/2014 15:59   Dg Chest Port 1 View  08/17/2014   CLINICAL DATA:  Respiratory failure.  History of smoking.  EXAM: PORTABLE CHEST - 1 VIEW  COMPARISON:  08/16/2014  FINDINGS: Right IJ central line sheath remains in place, tip overlying the superior vena cava. Endotracheal tube is/ in place, tip approximately 5.5 cm above carina.  Orogastric tube tip is beyond the image but also beyond the gastroesophageal junction.  Heart size is normal. Persistent lingular and right middle lobe atelectasis. There has been improvement in aeration of the left lung base. No pulmonary edema.  IMPRESSION: 1. Interval placement of nasogastric tube. 2. Improved aeration.   Electronically Signed   By: Nolon Nations M.D.   On: 08/17/2014 07:20   Dg Chest Port 1 View  08/16/2014   CLINICAL DATA:  Respiratory failure.  EXAM: PORTABLE CHEST - 1 VIEW  COMPARISON:  08/14/2014.  FINDINGS: Endotracheal tube and right IJ sheath in stable position. Stable cardiomegaly . Slight progression of bibasilar subsegmental atelectasis and bibasilar infiltrates. Small left pleural effusion. No pneumothorax. Metallic densities are noted over the lower mid chest. Carotid vascular calcification .  IMPRESSION: 1. Endotracheal tube and right IJ sheath in stable position. 2. Slight progression of bibasilar atelectasis and infiltrates. 3. Stable cardiomegaly. 4. Carotid vascular disease.  Electronically Signed   By: Marcello Moores  Register   On: 08/16/2014 07:11   Dg Chest Port 1 View  08/14/2014   CLINICAL DATA:  Hypoxia  EXAM: PORTABLE CHEST - 1 VIEW  COMPARISON:  August 13, 2014  FINDINGS: Endotracheal tube tip is 5.2 cm above the carina. Central catheter tip is in the superior vena cava. No pneumothorax. There is persistent patchy atelectatic change in the base regions, slightly less on the left and largely unchanged on the right. Lungs elsewhere are clear. Heart size and pulmonary vascularity are normal. No adenopathy. No bone lesions. Scattered foci of calcification are noted in each carotid artery.  IMPRESSION: Atelectasis in each lower lobe region. Tube and catheter positions as described without pneumothorax.   Electronically Signed   By: Lowella Grip III M.D.   On: 08/14/2014 07:03   Dg Chest Port 1 View  08/13/2014   CLINICAL DATA:  Central line placement.  EXAM: PORTABLE  CHEST - 1 VIEW  COMPARISON:  08/13/2014  FINDINGS: The endotracheal tube is 5 cm above the carina. There is a right IJ central venous Cordis with its tip in the mid SVC. No complicating features. Persistent areas of subsegmental atelectasis at the lung bases but no edema or effusions.  IMPRESSION: Right IJ central venous Cordis in good position without complicating features.  Endotracheal tube is 5 cm above the carina.  Bibasilar atelectasis but infiltrates or effusions.   Electronically Signed   By: Marijo Sanes M.D.   On: 08/13/2014 15:23   Dg Chest Port 1 View  08/13/2014   CLINICAL DATA:  Hypoxia.  EXAM: PORTABLE CHEST - 1 VIEW  COMPARISON:  08/05/2014.  FINDINGS: Mediastinum hilar structures stable. Slight improvement of right base subsegmental atelectasis/infiltrate. Mild left mid lung field subsegmental atelectasis. No pleural effusion or pneumothorax. Heart size normal.  IMPRESSION: 1. Slight improvement of right base subsegmental atelectasis and or infiltrate. 2. Mild left mid lung field subsegmental atelectasis.   Electronically Signed   By: Marcello Moores  Register   On: 08/13/2014 07:08   US Abdomen Limited Ruq  08/06/2014   CLINICAL DATA:  Abdominal pain and distension, history of hepatitis-C and HIV  EXAM: US ABDOMEN LIMITED - RIGHT UPPER QUADRANT  COMPARISON:  Abdominal ultrasound of September 06, 2012  FINDINGS: Gallbladder:  The gallbladder is adequately distended. There are echogenic mobile shadowing stones. There is no gallbladder wall thickening nor pericholecystic fluid. There is no positive sonographic Murphy's sign.  Common bile duct:  Diameter: 3.7 cm  Liver:  The liver appears shrunken its surface irregular. The echotexture is heterogeneous. There is a heterogeneous slightly hyperechoic mass in the right hepatic lobe measuring 4.7 x 3.9 x 4.4 cm. There is no intrahepatic ductal dilation. There is a moderate amount of ascites surrounding the liver.  IMPRESSION: 1. Cirrhotic changes within the  liver. A right lobe mass is suspected. Hepatic protocol MRI is recommended. 2. Gallstones without evidence of acute cholecystitis. 3. Moderate amount of ascites.   Electronically Signed   By: David  Martinique M.D.   On: 08/06/2014 16:32    Microbiology: No results found for this or any previous visit (from the past 240 hour(s)).   Labs: Basic Metabolic Panel:  Recent Labs Lab 08/17/14 0356  08/19/14 0400 08/20/14 0400 08/21/14 0355 08/22/14 0430 08/23/14 0400  NA 148*  < > 147* 152* 154* 151* 147*  K 3.0*  < > 3.2* 3.8 3.5 3.6 3.0*  CL 118*  < > 123* 128* 127* 126* 123*  CO2 21*  < > 19* 20* 20* 21* 20*  GLUCOSE 126*  < > 109* 125* 149* 125* 123*  BUN 45*  < > 44* 41* 44* 42* 33*  CREATININE 1.29*  < > 1.34* 1.52* 1.70* 1.68* 1.42*  CALCIUM 8.0*  < > 8.4* 8.5* 8.7* 8.8* 8.2*  MG 2.0  --  2.0  --  2.1 2.0 1.8  PHOS  --   --  2.9  --   --   --   --   < > = values in this interval not displayed. Liver Function Tests:  Recent Labs Lab 08/19/14 0400 08/20/14 0400 08/21/14 0355 08/22/14 0430 08/23/14 0400  AST 123* 96* 79* 56* 49*  ALT 128* 93* 75* 42 31  ALKPHOS 180* 184* 185* 122 105  BILITOT 2.4* 2.6* 2.5* 2.0* 1.9*  PROT 6.3* 6.4* 6.3* 5.2* 5.1*  ALBUMIN 2.0* 2.0* 2.0* 2.5* 2.9*   No results for input(s): LIPASE, AMYLASE in the last 168 hours. No results for input(s): AMMONIA in the last 168 hours. CBC:  Recent Labs Lab 08/19/14 0400 08/20/14 0400 08/21/14 0355 08/22/14 0430 08/23/14 0400  WBC 13.5* 17.5* 19.2* 11.3* 9.2  NEUTROABS 11.1*  --   --  8.7*  --   HGB 10.7* 10.6* 11.4* 8.7* 8.9*  HCT 32.3* 33.1* 36.5* 27.3* 27.5*  MCV 89.7 93.2 96.3 93.8 95.5  PLT 115* 122* 128* 104* 108*   Cardiac Enzymes: No results for input(s): CKTOTAL, CKMB, CKMBINDEX, TROPONINI in the last 168 hours. BNP: BNP (last 3 results) No results for input(s): BNP in the last 8760 hours.  ProBNP (last 3 results) No results for input(s): PROBNP in the last 8760  hours.  CBG:  Recent Labs Lab 08/22/14 1222 08/22/14 1628 08/22/14 2148 08/23/14 0816 08/23/14 1143  GLUCAP 118* 103* 100* 113* 115*       Signed:  Nita Sells  [facility] 08/23/2014, 4:17 PM

## 2014-08-23 NOTE — Progress Notes (Addendum)
[facility] Combs:[id number] DOB: 05/28/1950 DOA: 08/12/2014 PCP: [name], MD  Brief Summary  64 y/o ? hx of HIV off Atripla since 03/2014, Hep C s/p RX Interferon ~ 10 years prior, ETOH with cirrhosis/ascites who was recently admitted for CAP, portal vein thrombosis and newly diagnosed hepatocellular carcinoma-he was referred to [facility] but was not a good resection/ablation candidate Was placed on Coumadin that admission for Portal Vn Thrombosis Patient was readmitted with hematemesis and melena from variceal bleed.  He presented in hemorrhagic shock requiring intubation and emergent EGD.  Hospital course complicated NSTEMI from demand ischemia. Self extubated on 7/16 at which time he was made DNR/DNI by his family.   He has made fair progress- suring his time outside of the ICU and has had a paracentesis which was complicated by volume depletion and Azotemia from ICU stay/diuresis as well as hypotesnions  STUDIES:  7/11 CT c spine >> no acute injury; multilevel degenerative disc disease 7/12 EGD >> banding of varices 7/13 ECHO >> EF 45 to 22%, grade 1 diastolic dysfx  SIGNIFICANT EVENTS: 7/5-7/8 admission for CAP, cirrhosis, portal vein thrombosis 7/11 Re admit, [facility], GI consult >> EGD and VDRF; hemorrhagic shock with NSTEMI from demand ischemia 7/13 Goals of care d/w family >> continue medical therapy, DNR if arrests 7/16 Self extubated, weaned off vasopressors, Some abd pain / melena overnight.   Assessment/Plan  Active problems:  Multifactorial adult failure to thrive -Has HIV, hepatitis C with decompensated cirrhosis and hypotension, possible hepatorenal syndrome as well as new hepatoma -Very poor candidate for any type of intervention for his cancer -I had a discussion with his sister and with the patient regarding this -I have asked Dr. Hilma Favors of palliative care to weigh back in and discuss once again goals of care to ensure that  we are on the same page in terms of trajectory of his care being comfort and palliative care and eventually hospice -PPS~20, Estimating 22 days of at worst case scenario  Upper GI bleeding due to esophageal varices s/p clipping on 7/11.    intubated for airway protection and required vasopressors, transfusion of 4 units PRBC and 2 units FFP secondary to hemorrhagic shock/acute blood loss anemia. On 7/16, he was weaned off vasopressors and he self-extubated.  His lactic acidosis resolved.   He is now hemodynamically stable and on RA.   -  Start iron supplementation -  hgb stable  AKI, possibly due to ATN from hypotension, creatinine rising slightly.   Non anion gap metabolic acidosis due to AKI and hyperchloremia is stable but not improving - Increased IVF D5 50-->75 cc/hr -Palliative care to help delineate GOC  Hypernatremia due to poor oral intake  -  Change to D5W  -  Repeat BMP in AM -  Poor candidate for PEG feeds and NG free water   Fever 7/13 (Tm 101), likely intraabdominal source.  Rising leukocytosis is likely due to stress dose steroids.  Developing thrush.   -  08/21/14 last day of levofloxacin and flagyl -  Start fluconazole 7/19 -  Trend WBC - I will discuss further with palliative care as well as with ID further treatment but I suspect we can de-escalate care  Acute metabolic & hepatic encephalopathy, resolving - Decrease lactulose due to diarrhea - If mentation becomes problematic with ongoing diarrhea, consider rifaximin -Patient is miserable on lactulose and does not wish to use this any longer  -We will place him on this every  other day on discharge    Decompensated Cirrhosis with ascites, coagulopathy, and liver mass consistent with hepatocellular carcinoma due to ETOH, Hep C, and HIV MELD score ~ 11 -  INR stable -I will start Midrin for splanchnic clamp down and increase in systemic circulation shunting away from portal circulation -I do not think however this  will make a huge difference -  Holding diuretics  -  His low dose BB propanolol may need to be downward adjusted as he is hypotensive -  had large volume paracentesis performed 7/20 - Hypotension complicating this therefore given out and 75 mg 7/20 and 50 mg 7/21  TOH abuse, stable, continue thiamine and folate.  No current signs of withdrawal  HIV and Hep C. -  antiretroviral medicines per ID -  Poor overall likely candidate for aggressive medical therapy   Portal vein thrombosis -  No A/C due to hemorrhage  Acute systolic heart failure from NSTEMI type 2 (demand ischemia) and acute illness.   -  No ASA due to recent bleeding  -  Start low dose carvedilol  -  Continue to hold diuretics -  Outpatient cardiology f/u  Shock liver, resolving -  LFTs almost back to normal  Sick euthyroid, repeat TFTs in 4 weeks  Hypokalemia, resolved with supplementation  Mild hyperglycemia -  Change to meal time SSI  Severe protein calorie malnutrition. -  Regular diet with supplements -  Appreciate nutrition assistance  Diet:  regular Access:  PIV IVF:  yes Proph:  SCDs  Code Status: DNR Family Communication: patient alone Disposition Plan  Extensive discussion with the patient and the family at the bedside his brother I explained to them that he has multiple organ systems that are not doing well because of his HIV, he hepatitis as well as his hepatoma in addition to now new onset kidney dysfunction. They're still interested in hearing with palliative medicine has to say The patient himself tells me that he wants "everything done and is not willing to throw in the tell" when I ask him what that means he is not clear in terms of elucidating how he wants to go forward and I think delineation of goals of care is important before we make a plan to discharge this patient to a facility. I do not think that he has a good overall prognosis once again and I think that he is hospice as well  however patient is conscious and coherent and understands what is going on so I will defer further discussion about overall goals to palliative medicine    Consultants:  Gastroenterology, Dr. Paulita Fujita  Palliative care, Dr. Rowe Pavy  PCCM  Infectious disease, Dr. Johnnye Sima  Antibiotics:  Levofloxacin and flagyl, stopped 08/21/14  Tivicay (dolutegravir) and Truvada (emtricitabine-tenofovir) 7/16  Fluconazole 7/19 >  HPI/Subjective:   Fair No nausea no vomiting Tolerating only some by mouth Sitting in bed Starting to feel swollen again  Objective: Filed Vitals:   08/22/14 2109 08/23/14 0456 08/23/14 1307 08/23/14 1309  BP: 91/53 90/47 70/53  82/60  Pulse: 74 65 70 69  Temp: 97.9 F (36.6 C) 97.7 F (36.5 C) 97.8 F (36.6 C)   TempSrc: Oral Oral Oral   Resp: 16 16 16    Height:      Weight:      SpO2: 96% 94% 96% 97%    Intake/Output Summary (Last 24 hours) at 08/23/14 1508 Last data filed at 08/23/14 1500  Gross per 24 hour  Intake   1930 ml  Output    280 ml  Net   1650 ml   Filed Weights   08/12/14 2138 08/13/14 1458 08/15/14 0500  Weight: 54.1 kg (119 lb 4.3 oz) 60.8 kg (134 lb 0.6 oz) 63 kg (138 lb 14.2 oz)   Body mass index is 20.5 kg/(m^2).  Exam:   General:  Cachectic male, no acute distress  HEENT:  NCAT, MMM, cordis still in right neck  Cardiovascular:  RRR, nl S1, S2 no mrg, 2+ pulses, warm extremities  Respiratory:  CTAB, no increased WOB  Abdomen:   NABS, softympanitic distended  MSK:   Normal tone and bulk, no LEE  Neuro:  Diffusely weak.    Data Reviewed: Basic Metabolic Panel:  Recent Labs Lab 08/17/14 0356  08/19/14 0400 08/20/14 0400 08/21/14 0355 08/22/14 0430 08/23/14 0400  NA 148*  < > 147* 152* 154* 151* 147*  K 3.0*  < > 3.2* 3.8 3.5 3.6 3.0*  CL 118*  < > 123* 128* 127* 126* 123*  CO2 21*  < > 19* 20* 20* 21* 20*  GLUCOSE 126*  < > 109* 125* 149* 125* 123*  BUN 45*  < > 44* 41* 44* 42* 33*  CREATININE 1.29*  < >  1.34* 1.52* 1.70* 1.68* 1.42*  CALCIUM 8.0*  < > 8.4* 8.5* 8.7* 8.8* 8.2*  MG 2.0  --  2.0  --  2.1 2.0 1.8  PHOS  --   --  2.9  --   --   --   --   < > = values in this interval not displayed. Liver Function Tests:  Recent Labs Lab 08/19/14 0400 08/20/14 0400 08/21/14 0355 08/22/14 0430 08/23/14 0400  AST 123* 96* 79* 56* 49*  ALT 128* 93* 75* 42 31  ALKPHOS 180* 184* 185* 122 105  BILITOT 2.4* 2.6* 2.5* 2.0* 1.9*  PROT 6.3* 6.4* 6.3* 5.2* 5.1*  ALBUMIN 2.0* 2.0* 2.0* 2.5* 2.9*   No results for input(s): LIPASE, AMYLASE in the last 168 hours. No results for input(s): AMMONIA in the last 168 hours. CBC:  Recent Labs Lab 08/19/14 0400 08/20/14 0400 08/21/14 0355 08/22/14 0430 08/23/14 0400  WBC 13.5* 17.5* 19.2* 11.3* 9.2  NEUTROABS 11.1*  --   --  8.7*  --   HGB 10.7* 10.6* 11.4* 8.7* 8.9*  HCT 32.3* 33.1* 36.5* 27.3* 27.5*  MCV 89.7 93.2 96.3 93.8 95.5  PLT 115* 122* 128* 104* 108*    No results found for this or any previous visit (from the past 240 hour(s)).   Studies: No results found.  Scheduled Meds: . dolutegravir  50 mg Oral Daily  . emtricitabine-tenofovir  1 tablet Per Tube Q48H  . feeding supplement  1 Container Oral TID BM  . feeding supplement (ENSURE ENLIVE)  237 mL Oral BID BM  . fentaNYL  25 mcg Transdermal Q72H  . ferrous sulfate  325 mg Oral Q supper  . fluconazole  100 mg Oral Daily  . [START ON 1/94/1740] folic acid  1 mg Oral Daily  . insulin aspart  0-15 Units Subcutaneous TID WC  . insulin aspart  0-5 Units Subcutaneous QHS  . levofloxacin (LEVAQUIN) IV  750 mg Intravenous Q48H  . metroNIDAZOLE  250 mg Oral 3 times per day  . pantoprazole (PROTONIX) IV  40 mg Intravenous Q12H  . thiamine  100 mg Oral QHS   Continuous Infusions: . dextrose 75 mL/hr at 08/23/14 1148    Principal Problem:   Upper GI bleed Active  Problems:   Human immunodeficiency virus (HIV) disease   Alcohol consumption heavy   Liver mass   Leukocytosis    Anemia of chronic disease   ARF (acute renal failure)   HTN (hypertension), benign   Hepatitis C   Protein-calorie malnutrition, severe   Acute posthemorrhagic anemia   Endotracheal tube present   Hematemesis with nausea   Abdominal pain, generalized   Encounter for palliative care   Muscular deconditioning   HIV disease   Thrush    Time 30  Verneita Griffes, MD [facility] [phone]

## 2014-09-02 DEATH — deceased

## 2014-09-17 ENCOUNTER — Other Ambulatory Visit: Payer: 59

## 2014-10-01 ENCOUNTER — Ambulatory Visit: Payer: 59 | Admitting: Internal Medicine

## 2014-10-01 ENCOUNTER — Encounter: Payer: Self-pay | Admitting: *Deleted

## 2014-10-01 ENCOUNTER — Telehealth: Payer: Self-pay | Admitting: *Deleted

## 2014-10-01 NOTE — Telephone Encounter (Signed)
Sent to pt a MyChart message to call for a new appt.

## 2016-02-25 IMAGING — US US PARACENTESIS
1 series · 7 of 7 positions shown · non-contrast
Comparison: Paracentesis 08/06/14.

MEDICATIONS:
None.

COMPLICATIONS:
None immediate

INDICATION: Cirrhosis, recurrent ascites and request for paracentesis.

EXAM:
ULTRASOUND-GUIDED PARACENTESIS
TECHNIQUE: Informed written consent was obtained from the patient after a
discussion of the risks, benefits and alternatives to treatment. A
timeout was performed prior to the initiation of the procedure.

[Series 1: us paracentesis · 0.27mm/px · 7 of 7 slices shown]
[im 1/7]
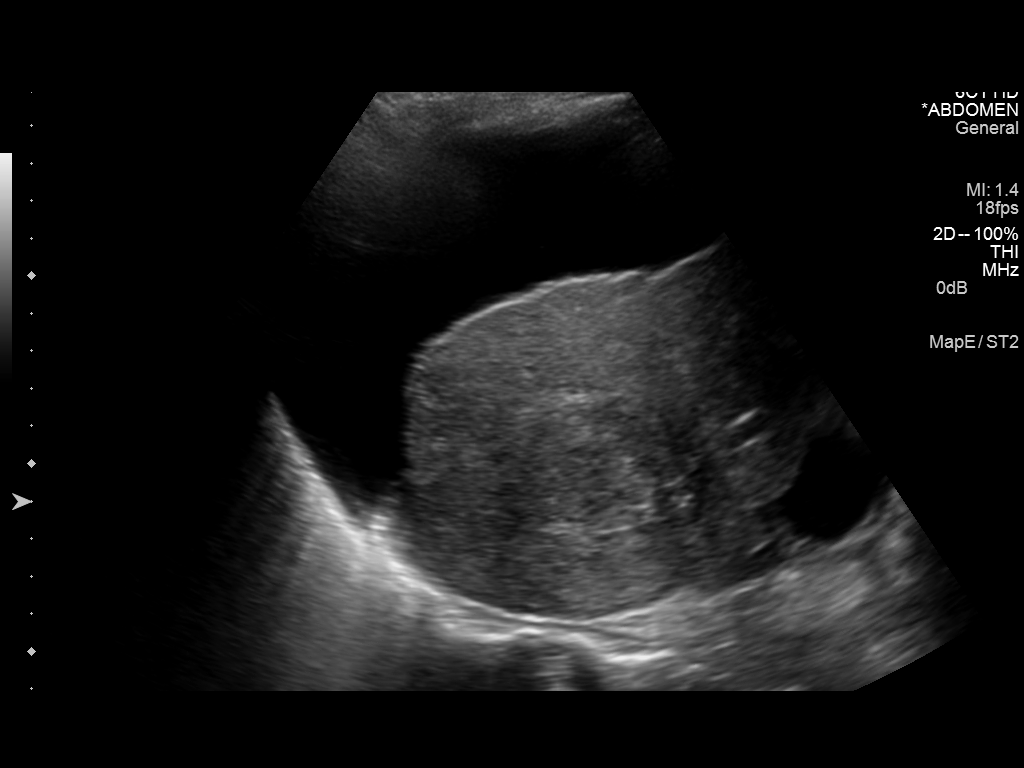
[im 2/7]
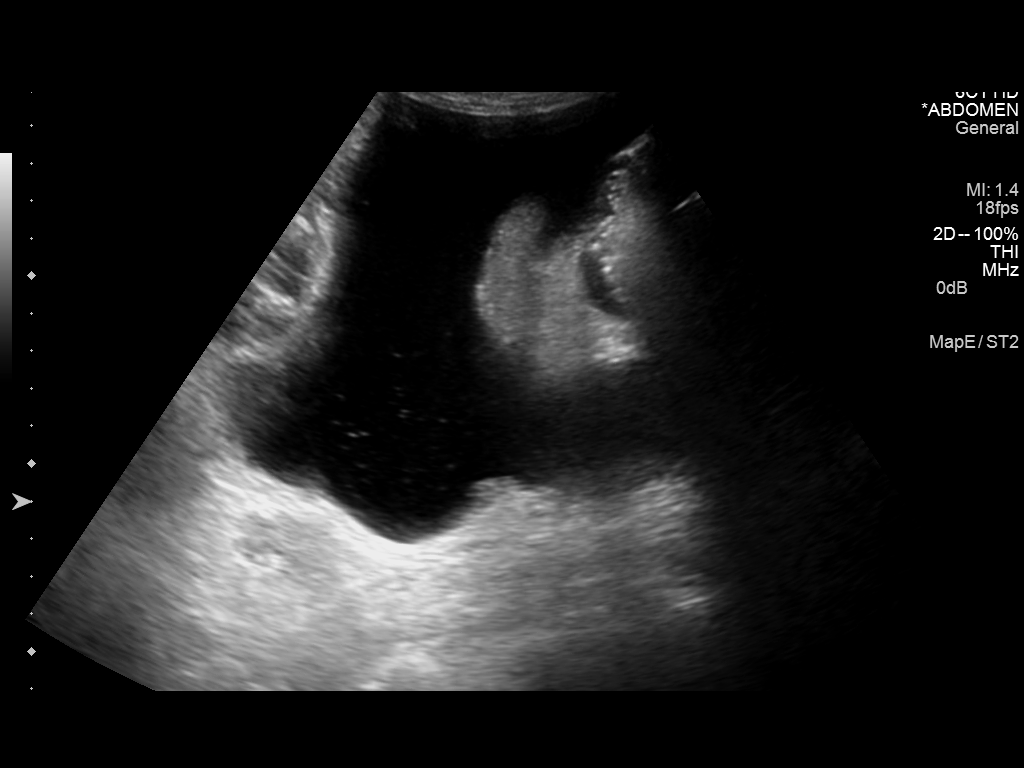
[im 3/7]
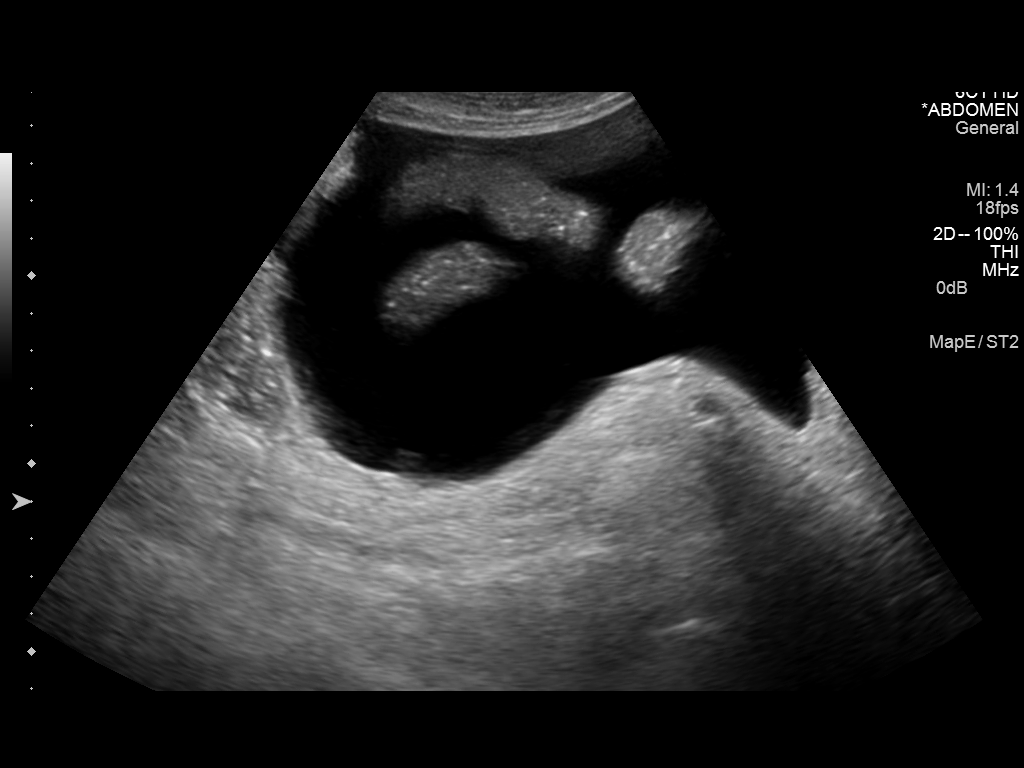
[im 4/7]
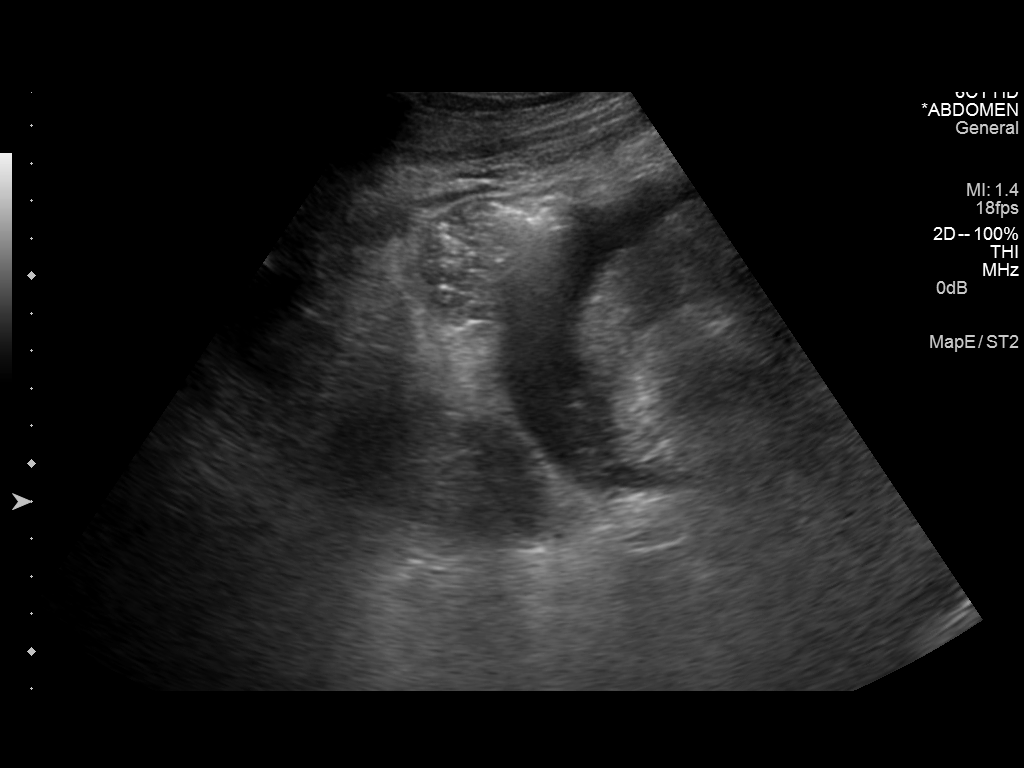
[im 5/7]
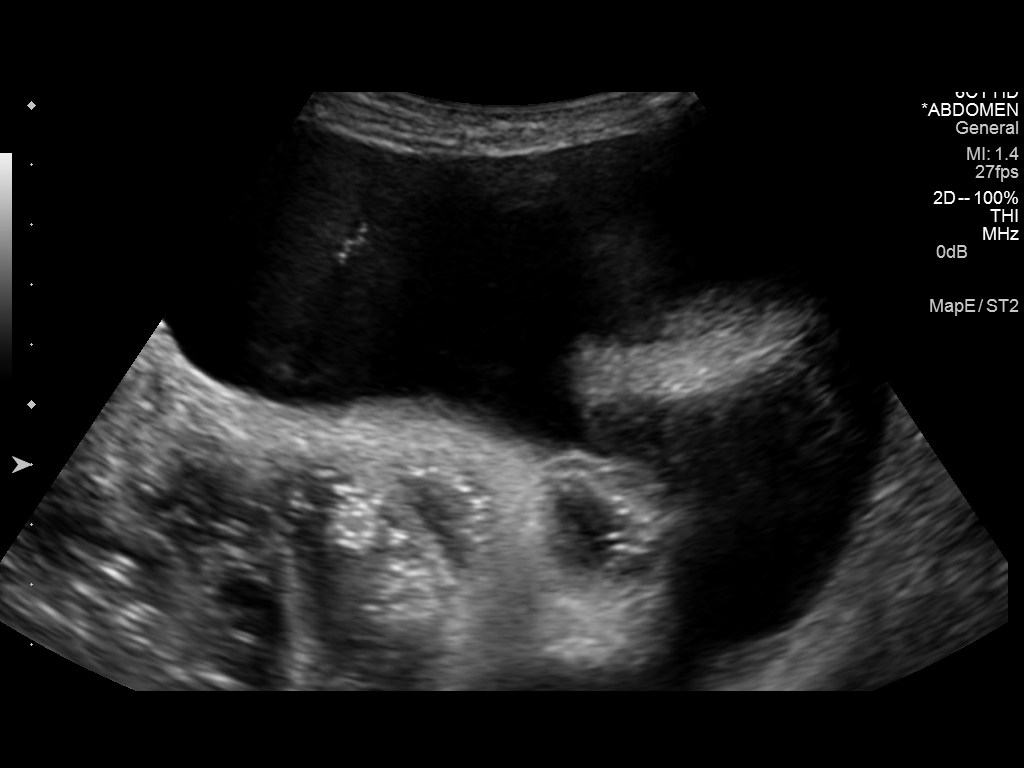
[im 6/7]
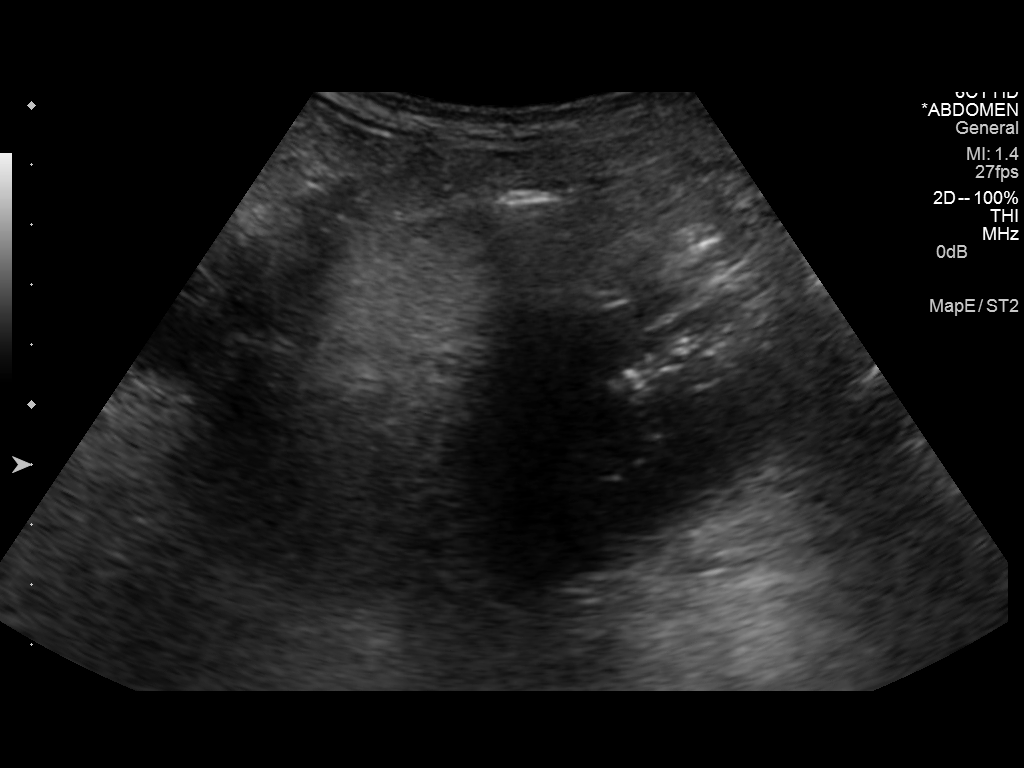
[im 7/7]
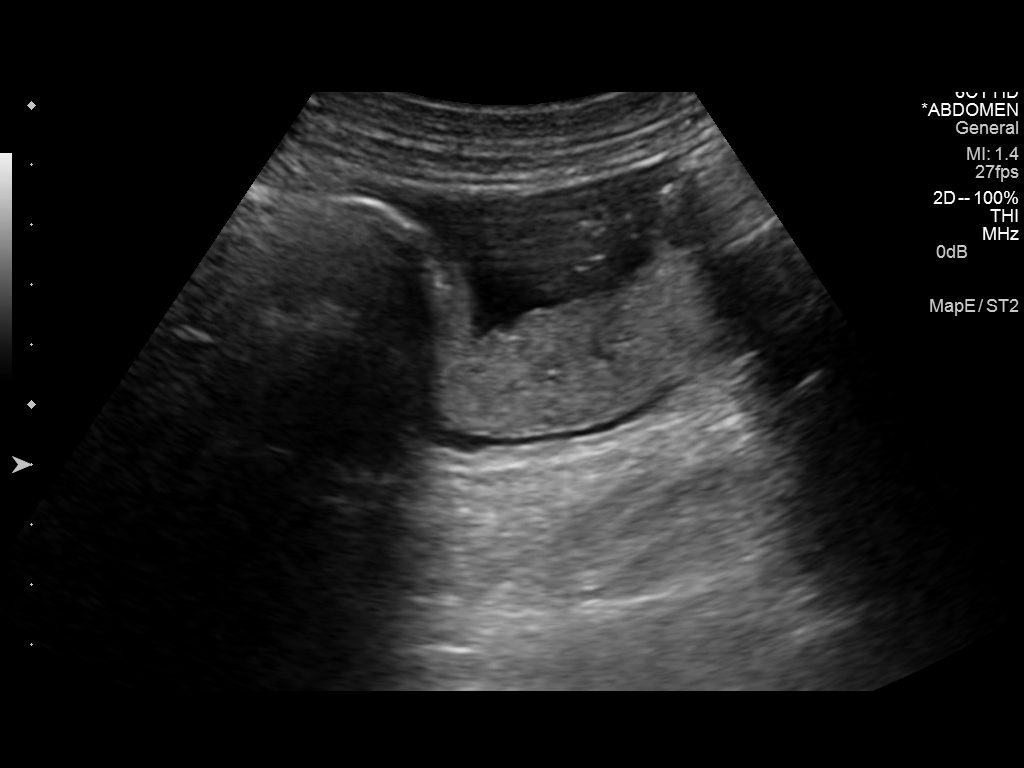

[7 of 7 positions shown; findings below may reference images not displayed]

Initial ultrasound scanning demonstrates a large amount of ascites
within the right lower abdominal quadrant. The right lower abdomen
was prepped and draped in the usual sterile fashion. 1% lidocaine
was used for local anesthesia.

Under direct ultrasound guidance, a 19 gauge, 7-cm, Yueh catheter
was introduced. An ultrasound image was saved for documentation
purposed. The paracentesis was performed. The catheter was removed
and a dressing was applied. The patient tolerated the procedure well
without immediate post procedural complication.
FINDINGS: A total of approximately 4.8 liters of serous fluid was removed.
IMPRESSION: Successful ultrasound-guided paracentesis yielding 4.8 liters of
peritoneal fluid.

## 2016-11-21 IMAGING — US US PARACENTESIS
1 series · 9 of 9 positions shown · non-contrast
Comparison: None.

MEDICATIONS:
10 cc 1% lidocaine

COMPLICATIONS:
None immediate

INDICATION: Symptomatic ascites.

EXAM:
ULTRASOUND-GUIDED PARACENTESIS
TECHNIQUE: Informed written consent was obtained from the patient after a
discussion of the risks, benefits and alternatives to treatment. A
timeout was performed prior to the initiation of the procedure.

[Series 1: us paracentesis · 0.27mm/px · 9 of 9 slices shown]
[im 1/9]
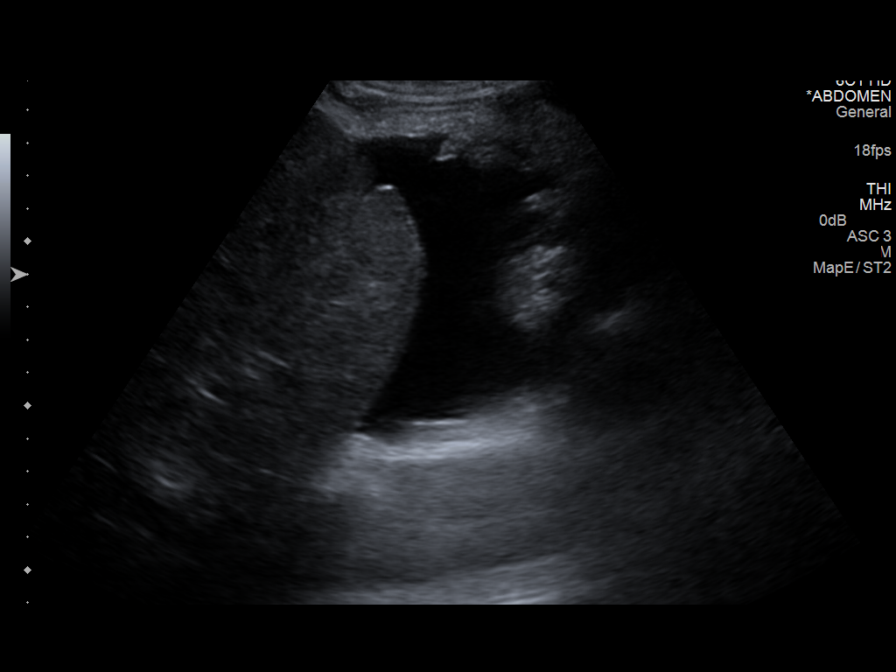
[im 2/9]
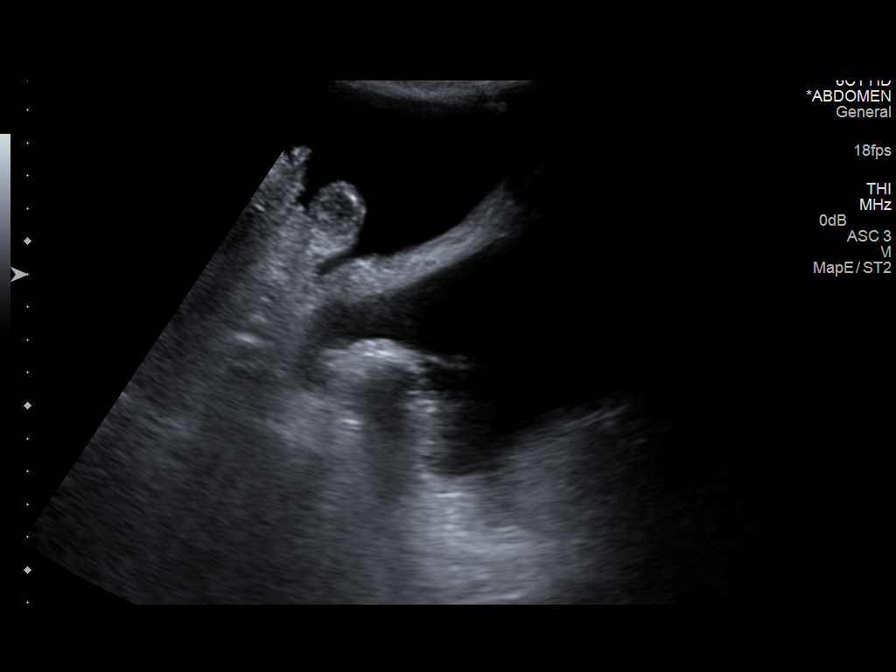
[im 3/9]
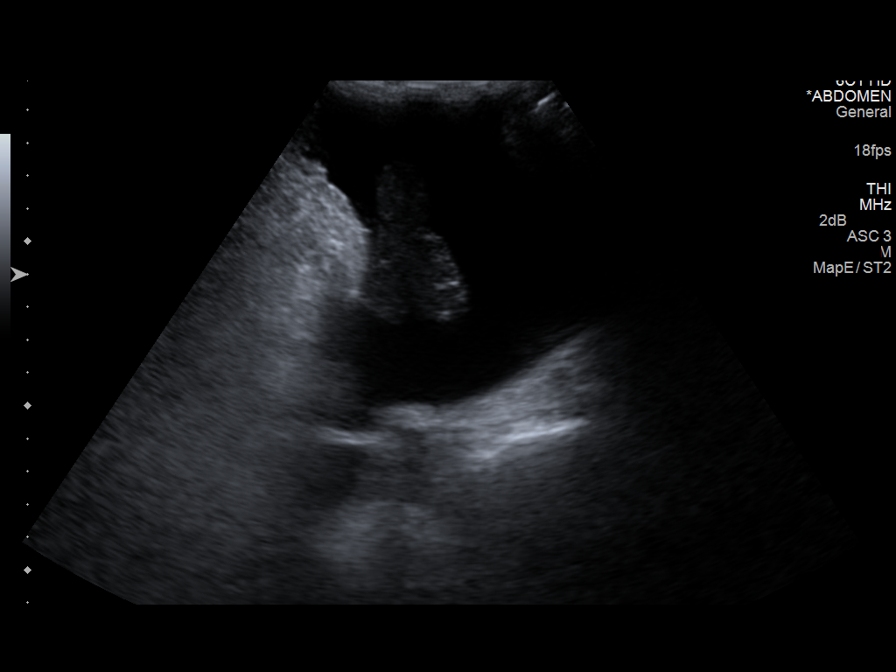
[im 4/9]
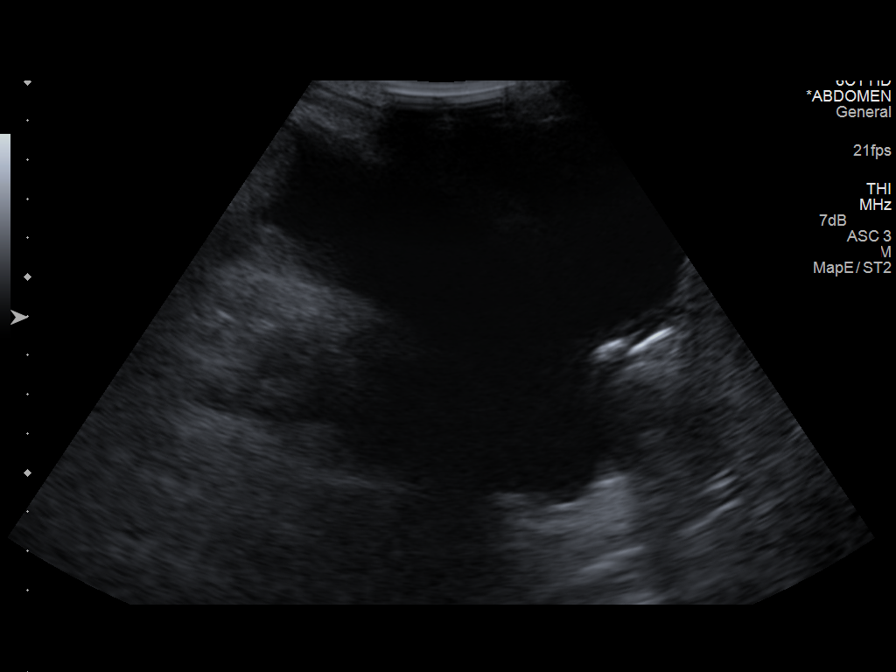
[im 5/9]
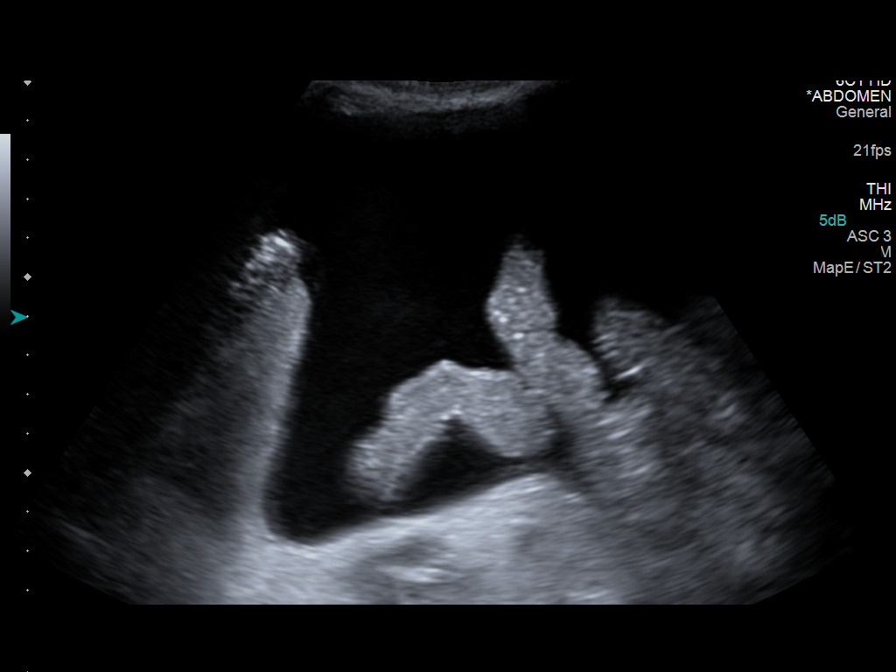
[im 6/9]
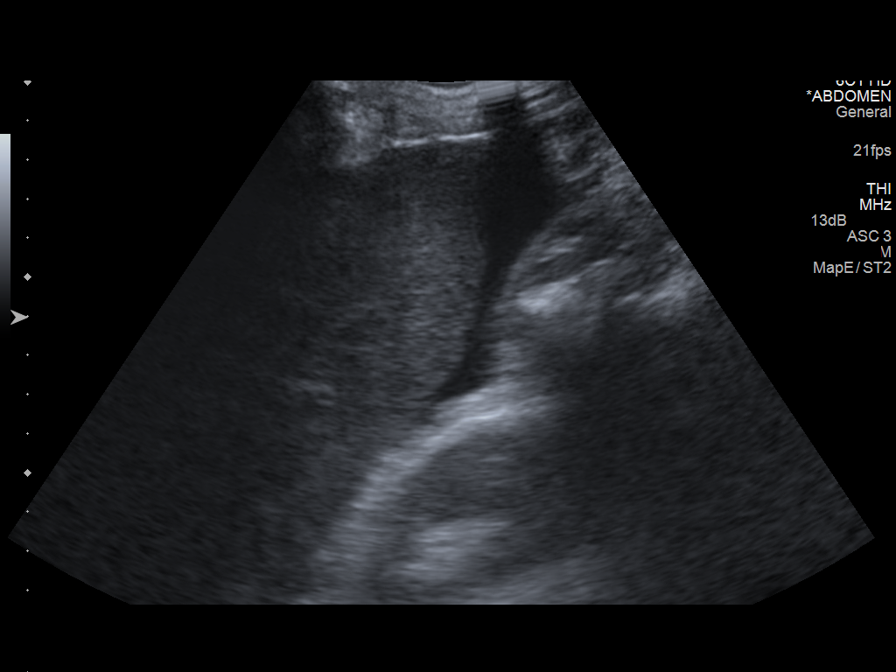
[im 7/9]
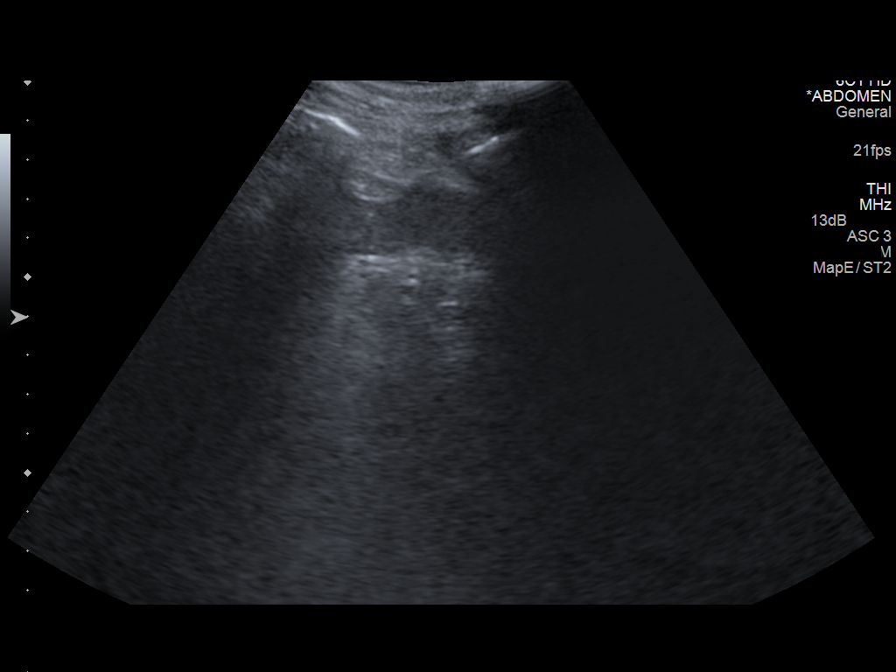
[im 8/9]
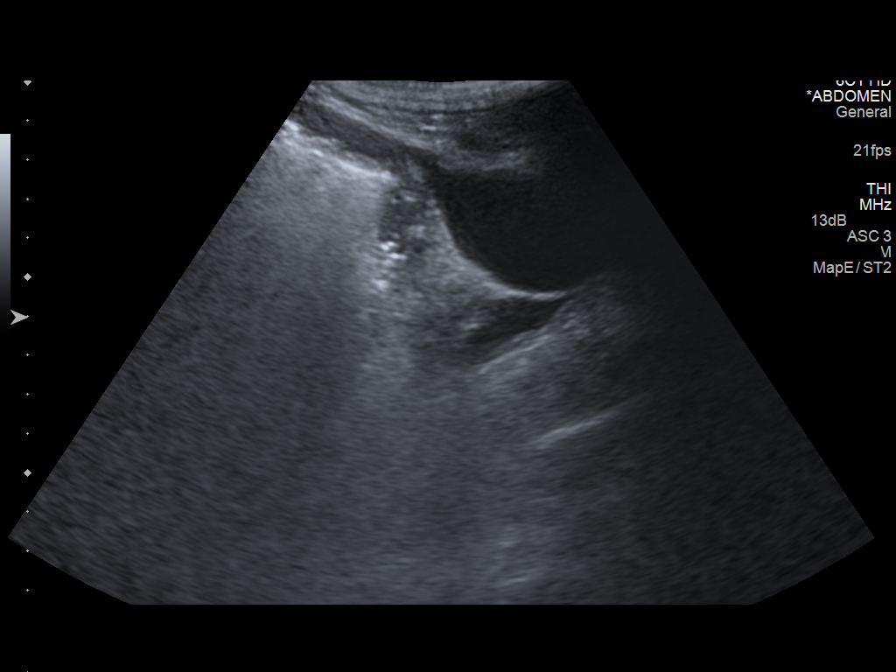
[im 9/9]
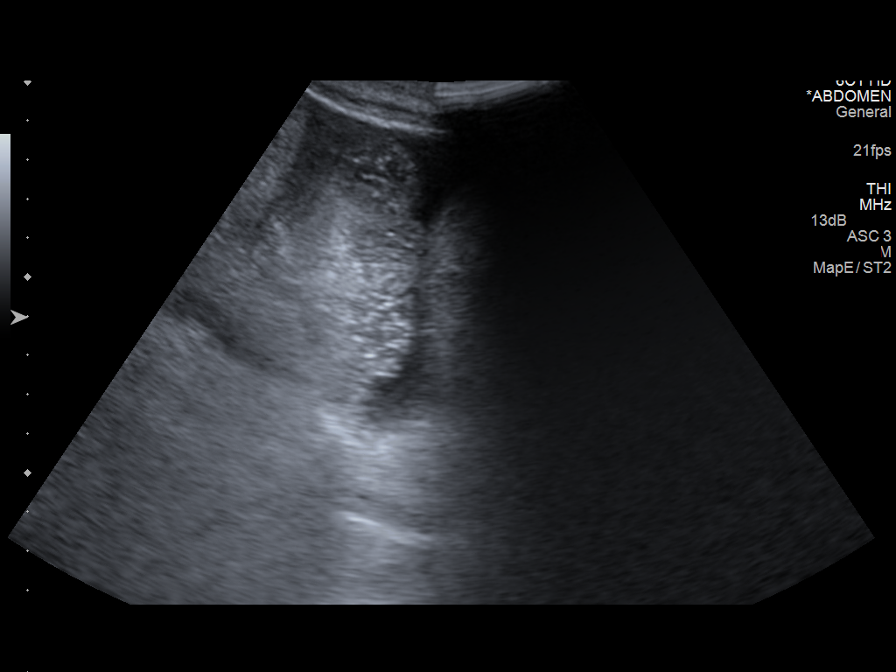

[9 of 9 positions shown; findings below may reference images not displayed]

Initial ultrasound scanning demonstrates a large amount of ascites
within the left lower abdominal quadrant. The left lower abdomen was
prepped and draped in the usual sterile fashion. 1% lidocaine with
epinephrine was used for local anesthesia. Under direct ultrasound
guidance, a 19 gauge, 7-cm, Yueh catheter was introduced. An
ultrasound image was saved for documentation purposed. The
paracentesis was performed. The catheter was removed and a dressing
was applied. The patient tolerated the procedure well without
immediate post procedural complication.
FINDINGS: A total of approximately 3.5 liters of yellow fluid was removed.
Samples were sent to the laboratory as requested by the clinical
team.
IMPRESSION: Successful ultrasound-guided paracentesis yielding 3.5 liters of
peritoneal fluid.

Read by:  Ebelfez Treyder
